# Patient Record
Sex: Male | Born: 1935 | Race: White | Hispanic: No | Marital: Married | Smoking: Former smoker
Health system: Southern US, Community
[De-identification: ages and names within clinical notes are randomized; demographics above are authoritative.]

## PROBLEM LIST (undated history)

## (undated) DIAGNOSIS — I10 Essential (primary) hypertension: Secondary | ICD-10-CM

## (undated) DIAGNOSIS — G4730 Sleep apnea, unspecified: Secondary | ICD-10-CM

## (undated) DIAGNOSIS — E785 Hyperlipidemia, unspecified: Secondary | ICD-10-CM

## (undated) DIAGNOSIS — G453 Amaurosis fugax: Secondary | ICD-10-CM

## (undated) DIAGNOSIS — I5189 Other ill-defined heart diseases: Secondary | ICD-10-CM

## (undated) HISTORY — DX: Sleep apnea, unspecified: G47.30

## (undated) HISTORY — PX: TONSILLECTOMY: SUR1361

## (undated) HISTORY — DX: Other ill-defined heart diseases: I51.89

## (undated) HISTORY — DX: Essential (primary) hypertension: I10

## (undated) HISTORY — DX: Amaurosis fugax: G45.3

## (undated) HISTORY — DX: Hyperlipidemia, unspecified: E78.5

## (undated) HISTORY — PX: CATARACT EXTRACTION: SUR2

## (undated) HISTORY — PX: CHOLECYSTECTOMY: SHX55

---

## 1995-08-15 HISTORY — PX: TRANSURETHRAL RESECTION OF PROSTATE: SHX73

## 1998-08-14 HISTORY — PX: SEPTOPLASTY: SUR1290

## 1999-01-20 ENCOUNTER — Other Ambulatory Visit: Admission: RE | Admit: 1999-01-20 | Discharge: 1999-01-20 | Payer: Self-pay | Admitting: *Deleted

## 2001-10-03 ENCOUNTER — Encounter: Payer: Self-pay | Admitting: Family Medicine

## 2001-10-03 ENCOUNTER — Encounter: Admission: RE | Admit: 2001-10-03 | Discharge: 2001-10-03 | Payer: Self-pay | Admitting: Family Medicine

## 2004-06-03 ENCOUNTER — Encounter: Admission: RE | Admit: 2004-06-03 | Discharge: 2004-06-03 | Payer: Self-pay | Admitting: Family Medicine

## 2011-12-20 ENCOUNTER — Encounter: Payer: Self-pay | Admitting: Cardiovascular Disease

## 2012-01-16 ENCOUNTER — Encounter: Payer: Self-pay | Admitting: Cardiovascular Disease

## 2012-12-03 ENCOUNTER — Other Ambulatory Visit (HOSPITAL_COMMUNITY): Payer: Self-pay | Admitting: Cardiovascular Disease

## 2012-12-03 DIAGNOSIS — I119 Hypertensive heart disease without heart failure: Secondary | ICD-10-CM

## 2013-01-10 ENCOUNTER — Other Ambulatory Visit: Payer: Self-pay | Admitting: *Deleted

## 2013-01-10 MED ORDER — LISINOPRIL 40 MG PO TABS: 40.0000 mg | ORAL_TABLET | Freq: Every day | ORAL | Status: DC

## 2013-01-16 ENCOUNTER — Other Ambulatory Visit: Payer: Self-pay | Admitting: *Deleted

## 2013-01-16 MED ORDER — LISINOPRIL 40 MG PO TABS: 40.0000 mg | ORAL_TABLET | Freq: Every day | ORAL | Status: DC

## 2013-01-20 ENCOUNTER — Telehealth: Payer: Self-pay | Admitting: Cardiovascular Disease

## 2013-01-20 NOTE — Telephone Encounter (Signed)
Was prescribe Ambien and wants to know is there anything else that he can prescribe for him similar because Ambien is expensive.  Thanks

## 2013-01-20 NOTE — Telephone Encounter (Signed)
Call patient to see what med is on formulary

## 2013-01-20 NOTE — Telephone Encounter (Signed)
Returned call.  Pt stated he was seen a few weeks ago and was rx'd [place].  Stated he got the [place] filled 20 pills for $120 and stated lunesta wasn't covered either.  Pt wants to know if there is something else that can be rx'd that may be covered.  Stated he was given information as to what was covered.  Pt informed Dr. Tresa Endo will be notified.  Scheduler not available and pt informed a message will be sent to call pt to reschedule.  Pt verbalized understanding and agreed w/ plan.  Message forwarded to Dr. Tresa Endo.  Paper chart#[id number] on Dr. Landry Dyke cart w/ this note.

## 2013-01-29 ENCOUNTER — Telehealth: Payer: Self-pay | Admitting: *Deleted

## 2013-01-29 MED ORDER — ZOLPIDEM TARTRATE 5 MG PO TABS: 5.0000 mg | ORAL_TABLET | Freq: Every evening | ORAL | Status: DC | PRN

## 2013-01-29 NOTE — Telephone Encounter (Signed)
Patient requested a different RX than the long acting zolpidem due to cost. Informed patient Dr. Tresa Endo recommended the regular zolpidem, however he was given the long acting for sleep maintenance, not to initiate sleep. The patient informs me that recently he has been using a chin strap along with the zolpidem ER and has been sleeping throughout the night. He will try the short-acting dosage once he completes the prescription he just filled today for the long-acting dosage.  I will mail a prescription to the patient. Chart along with a printed zolpidem RX given to Dr. Tresa Endo for signature.

## 2013-02-05 ENCOUNTER — Telehealth: Payer: Self-pay | Admitting: *Deleted

## 2013-02-05 NOTE — Telephone Encounter (Signed)
Mailed zolpidem prescription to patient.

## 2013-02-11 ENCOUNTER — Encounter: Payer: Self-pay | Admitting: Cardiovascular Disease

## 2013-02-12 ENCOUNTER — Telehealth: Payer: Self-pay | Admitting: Cardiovascular Disease

## 2013-02-12 NOTE — Telephone Encounter (Signed)
Went to his family doctor yesterday-he prescribed him to take  Ramelteon-In the mail yesterday he received a prescription for generic Ambien from Dr Tresa Endo! Which one should he take?

## 2013-02-12 NOTE — Telephone Encounter (Signed)
Message forwarded to W. Waddell, CMA.  Paper chart# [id number].

## 2013-04-11 ENCOUNTER — Other Ambulatory Visit: Payer: Self-pay | Admitting: *Deleted

## 2013-04-11 NOTE — Telephone Encounter (Signed)
Faxed back generic ambien refill back to pharmacy.

## 2013-05-20 ENCOUNTER — Ambulatory Visit (HOSPITAL_COMMUNITY)
Admission: RE | Admit: 2013-05-20 | Discharge: 2013-05-20 | Disposition: A | Discharge status: Home / Self Care | Payer: Medicare Other | Source: Ambulatory Visit | Attending: Cardiovascular Disease | Admitting: Cardiovascular Disease

## 2013-05-20 DIAGNOSIS — I119 Hypertensive heart disease without heart failure: Secondary | ICD-10-CM

## 2013-05-20 NOTE — Progress Notes (Signed)
2D Echo Performed 05/20/2013    Joni Colegrove, RCS  

## 2013-05-25 ENCOUNTER — Encounter: Payer: Self-pay | Admitting: *Deleted

## 2013-05-25 NOTE — Progress Notes (Signed)
Quick Note:  Note sent to patient ______ 

## 2013-05-26 ENCOUNTER — Encounter: Payer: Self-pay | Admitting: Cardiology

## 2013-05-27 ENCOUNTER — Encounter: Payer: Self-pay | Admitting: Cardiovascular Disease

## 2013-05-27 ENCOUNTER — Ambulatory Visit (INDEPENDENT_AMBULATORY_CARE_PROVIDER_SITE_OTHER): Payer: Medicare Other | Admitting: Cardiovascular Disease

## 2013-05-27 VITALS — BP 118/70 | HR 61 | Ht 72.0 in | Wt 206.1 lb

## 2013-05-27 DIAGNOSIS — G4733 Obstructive sleep apnea (adult) (pediatric): Secondary | ICD-10-CM | POA: Insufficient documentation

## 2013-05-27 DIAGNOSIS — Z9889 Other specified postprocedural states: Secondary | ICD-10-CM

## 2013-05-27 DIAGNOSIS — E785 Hyperlipidemia, unspecified: Secondary | ICD-10-CM

## 2013-05-27 DIAGNOSIS — Q619 Cystic kidney disease, unspecified: Secondary | ICD-10-CM

## 2013-05-27 DIAGNOSIS — N281 Cyst of kidney, acquired: Secondary | ICD-10-CM | POA: Insufficient documentation

## 2013-05-27 DIAGNOSIS — I10 Essential (primary) hypertension: Secondary | ICD-10-CM

## 2013-05-27 DIAGNOSIS — M109 Gout, unspecified: Secondary | ICD-10-CM

## 2013-05-27 DIAGNOSIS — Z9079 Acquired absence of other genital organ(s): Secondary | ICD-10-CM | POA: Insufficient documentation

## 2013-05-27 MED ORDER — ROSUVASTATIN CALCIUM 20 MG PO TABS: 20.0000 mg | ORAL_TABLET | Freq: Every day | ORAL | Status: DC

## 2013-05-27 NOTE — Progress Notes (Signed)
Patient ID: Kenneth [name], male   DOB: 06/09/1936, 77 y.o.   MRN: [id number]     HPI: Kenneth [name], is a 77 y.o. male who presents for six-month cardiology evaluation.  Kenneth [name] has a history of hypertension, hyperlipidemia, gout, and is status post TURP surgery [name]. He also has documented bilateral renal cysts. He has a history of documented moderate obstructive sleep apnea with an HIV 0.2 0.7 and an RDI of 33 and has been on BiPAP therapy with a patent minimum of 10, IPAP maximum potential up to 25 with a delta of for which he started in July 2012. With CPAP therapy, he did develop central events.  Kenneth [name] recently underwent a three-year followup echo Doppler study. This revealed normal left ventricle wall thickness with normal systolic and diastolic function. He did have mild aortic regurgitation due to poor central leaflet coaptation. It was borderline prolapse of his anterior mitral valve leaflet with trivial mitral regurgitation. He had normal pulmonary pressures assessment artery systolic pressure 19 mm.  He tells me his primary physician recently increased torsemide 20 mg daily due to leg swelling., He has noticed some very mild episodes of dizziness. He denies recent dizziness presently.  He states he's sleeping approximately 6 hours per night. He has taken Ambien at times to help him fall asleep. He denies residual daytime sleepiness. He denies frequent awakenings. His sleep is restorative but at times is not adequate duration.  Past Medical History  Diagnosis Date  . HTN (hypertension)   . Dyslipidemia   . Sleep apnea     on BiPap  . Diastolic dysfunction     grade 1 by echo 4/11    Past Surgical History  Procedure Laterality Date  . Transurethral resection of prostate  1997  . Septoplasty  2000    No Known Allergies  Current Outpatient Prescriptions  Medication Sig Dispense Refill  . allopurinol (ZYLOPRIM) 100 MG tablet Take 100 mg by mouth daily.      Marland Kitchen  amLODipine (NORVASC) 5 MG tablet Take 5 mg by mouth daily.      Marland Kitchen aspirin 81 MG tablet Take 81 mg by mouth daily.      . ferrous sulfate 325 (65 FE) MG EC tablet Take 325 mg by mouth daily.      . fish oil-omega-3 fatty acids 1000 MG capsule Take 2 g by mouth daily.      Marland Kitchen HYDROcodone-acetaminophen (NORCO/VICODIN) 5-325 MG per tablet Take 1 tablet by mouth as needed.      Marland Kitchen lisinopril (PRINIVIL,ZESTRIL) 40 MG tablet Take 1 tablet (40 mg total) by mouth daily.  30 tablet  6  . loratadine (CLARITIN) 10 MG tablet Take 10 mg by mouth daily.      . Multiple Vitamin (MULTIVITAMIN WITH MINERALS) TABS Take 1 tablet by mouth daily.      . Nebivolol HCl (BYSTOLIC) 20 MG TABS Take 1 tablet by mouth daily.      . rosuvastatin (CRESTOR) 20 MG tablet Take 20 mg by mouth daily.      . sildenafil (VIAGRA) 50 MG tablet Take 50 mg by mouth as needed for erectile dysfunction.      . tamsulosin (FLOMAX) 0.4 MG CAPS Take 0.4 mg by mouth daily after supper.      . torsemide (DEMADEX) 20 MG tablet Take 20 mg by mouth daily.      Marland Kitchen zolpidem (AMBIEN) 5 MG tablet Take 1 tablet (5 mg total) by mouth at bedtime as  needed for sleep.  30 tablet  0   No current facility-administered medications for this visit.    History   Social History  . Marital Status: Single    Spouse Name: N/A    Number of Children: N/A  . Years of Education: N/A   Occupational History  . Not on file.   Social History Main Topics  . Smoking status: Never Smoker   . Smokeless tobacco: Not on file  . Alcohol Use: Not on file  . Drug Use: Not on file  . Sexual Activity: Not on file   Other Topics Concern  . Not on file   Social History Narrative  . No narrative on file    Family History  Problem Relation Age of Onset  . Alzheimer's disease Mother 46  . Emphysema Father    Socially he is married and has 3 children, 14 grandchildren and 3 great-grandchildren. There is no tobacco or alcohol use.   ROS is negative for fevers,  chills or night sweats. He denies recent dizzy spells. He denies syncope. He is unaware of palpitations. He denies PND or orthopnea. There is no wheezing. There is no chest pressure. He denies any blood in his stool or urine. He denies abdominal complaints. There is no claudication. He denies restless legs. He denies hallucinations. He is unaware of any breakthrough snoring. He no longer notes leg swelling   Other comprehensive 12 point system review is negative.  PE BP 118/70  Pulse 61  Ht 6' (1.829 m)  Wt 206 lb 1.6 oz (93.486 kg)  BMI 27.95 kg/m2  Repeat blood pressure by me was 108/70 supine was 102/68 standing General: Alert, oriented, no distress.  Skin: normal turgor, no rashes HEENT: Normocephalic, atraumatic. Pupils round and reactive; sclera anicteric;no lid lag.  Nose without nasal septal hypertrophy Mouth/Parynx benign; Mallinpatti scale 3 Neck: No JVD, no carotid briuts Lungs: clear to ausculatation and percussion; no wheezing or rales Heart: RRR, s1 s2 normal 1/6 systolic murmur and a whiff of an aortic insufficiency murmur Abdomen: soft, nontender; no hepatosplenomehaly, BS+; abdominal aorta nontender and not dilated by palpation. Pulses 2+ Extremities: no clubbing cyanosis or edema, Homan's sign negative  Neurologic: grossly nonfocal Psychologic: Normal affect and mood  ECG: Sinus rhythm at 61 beats per minute. Borderline first degree AV block with PR interval 200 ms. QT interval normal 388 ms.  LABS:  BMET No results found for this basename: na, k, cl, co2, glucose, bun, creatinine, calcium, gfrnonaa, gfraa     Hepatic Function Panel  No results found for this basename: prot, albumin, ast, alt, alkphos, bilitot, bilidir, ibili     CBC No results found for this basename: wbc, rbc, hgb, hct, plt, mcv, mch, mchc, rdw, neutrabs, lymphsabs, monoabs, eosabs, basosabs     BNP No results found for this basename: probnp    Lipid Panel  No results found for this  basename: chol, trig, hdl, cholhdl, vldl, ldlcalc     RADIOLOGY: No results found.    ASSESSMENT AND PLAN: Kenneth [name] is a 77 year old gentleman who is a history of hypertension, hyperlipidemia, intermittent peripheral edema, as well as remote history of gout. He has documented bilateral renal cysts and is status post TURP. His blood pressure today is somewhat low on his fairly aggressive regimen. His most recent echo Doppler study actually is improved and now shows normal diastolic function. At present, there are no signs of leg swelling. I recommended he reduce his torsemide to 10  mg and then he can take an extra dose if necessary depending upon leg edema. His ECG remained stable. I reviewed his echo Doppler study in detail with him. Laboratory in April 2014 showed a normal hemoglobin and hematocrit. He tells me his primary physician had recently started some iron after subsequent blood work was somewhat lower but apparently the patient gave blood several weeks ago and his blood count was again elevated. He has tolerated the increase fish oil preparation to 2 capsules daily which was adjusted in April after his triglyceride level was 164. We did try to provide him with samples of Crestor today. As long as he remains stable I will see him in 6 months for cardiology reevaluation.     Lennette Bihari, MD, [facility]  05/27/2013 10:04 AM

## 2013-05-27 NOTE — Patient Instructions (Addendum)
Your physician recommends that you schedule a follow-up appointment in: 6 MONTHS.  Your physician has recommended you make the following change in your medication: decrease the fluid pill to 10mg  daily (Torsemide) you can take xtra as needed for swelling.  No other changes were made in your treatment today.

## 2013-05-29 ENCOUNTER — Encounter: Payer: Self-pay | Admitting: Cardiovascular Disease

## 2013-08-02 ENCOUNTER — Encounter: Payer: Self-pay | Admitting: Emergency Medicine

## 2013-08-02 ENCOUNTER — Emergency Department (INDEPENDENT_AMBULATORY_CARE_PROVIDER_SITE_OTHER)
Admission: EM | Admit: 2013-08-02 | Discharge: 2013-08-02 | Disposition: A | Discharge status: Home / Self Care | Payer: Medicare Other | Source: Home / Self Care | Attending: Family Medicine | Admitting: Family Medicine

## 2013-08-02 DIAGNOSIS — N410 Acute prostatitis: Secondary | ICD-10-CM

## 2013-08-02 LAB — POCT URINALYSIS DIP (MANUAL ENTRY)
Blood, UA: NEGATIVE
Ketones, POC UA: NEGATIVE
Protein Ur, POC: 30
Spec Grav, UA: 1.01 (ref 1.005–1.03)
Urobilinogen, UA: 0.2 (ref 0–1)
pH, UA: 5.5 (ref 5–8)

## 2013-08-02 MED ORDER — SULFAMETHOXAZOLE-TMP DS 800-160 MG PO TABS: 1.0000 | ORAL_TABLET | Freq: Two times a day (BID) | ORAL | Status: DC

## 2013-08-02 NOTE — ED Provider Notes (Signed)
CSN: [id number]     Arrival date & time 08/02/13  1406 History   First MD Initiated Contact with Patient 08/02/13 1534     Chief Complaint  Patient presents with  . Fever  . Dysuria  . Urinary Frequency      HPI Comments: Patient has a long history of BPH and recurring prostatitis.  Recently he has had increased difficulty urinating each morning.  He awoke this morning with dusuria, chills and fever to 101.3, increased thirst, and lower back ache.  His urologist has instructed him in a daily cath each morning.  He is scheduled for urodynamic studies and cystoscope early next year.  He has had several TURP's in the past.  Patient is a 77 y.o. male presenting with dysuria. The history is provided by the patient.  Dysuria This is a recurrent problem. The current episode started 6 to 12 hours ago. The problem occurs constantly. The problem has not changed since onset.Pertinent negatives include no abdominal pain. Nothing aggravates the symptoms. Nothing relieves the symptoms. He has tried nothing for the symptoms.    Past Medical History  Diagnosis Date  . HTN (hypertension)   . Dyslipidemia   . Sleep apnea     on BiPap  . Diastolic dysfunction     grade 1 by echo 4/11   Past Surgical History  Procedure Laterality Date  . Transurethral resection of prostate  1997  . Septoplasty  2000   Family History  Problem Relation Age of Onset  . Alzheimer's disease Mother 13  . Emphysema Father    History  Substance Use Topics  . Smoking status: Never Smoker   . Smokeless tobacco: Not on file  . Alcohol Use: Not on file    Review of Systems  Constitutional: Positive for fever, chills and fatigue.  HENT: Negative.   Eyes: Negative.   Respiratory: Negative.   Cardiovascular: Negative.   Gastrointestinal: Negative.  Negative for abdominal pain.  Genitourinary: Positive for dysuria, urgency, frequency and decreased urine volume. Negative for hematuria and flank pain.  Musculoskeletal:  Negative.   Skin: Negative.     Allergies  Review of patient's allergies indicates no known allergies.  Home Medications   Current Outpatient Rx  Name  Route  Sig  Dispense  Refill  . allopurinol (ZYLOPRIM) 100 MG tablet   Oral   Take 100 mg by mouth daily.         Marland Kitchen amLODipine (NORVASC) 5 MG tablet   Oral   Take 5 mg by mouth daily.         Marland Kitchen aspirin 81 MG tablet   Oral   Take 81 mg by mouth daily.         . ferrous sulfate 325 (65 FE) MG EC tablet   Oral   Take 325 mg by mouth daily.         . fish oil-omega-3 fatty acids 1000 MG capsule   Oral   Take 2 g by mouth daily.         Marland Kitchen HYDROcodone-acetaminophen (NORCO/VICODIN) 5-325 MG per tablet   Oral   Take 1 tablet by mouth as needed.         Marland Kitchen lisinopril (PRINIVIL,ZESTRIL) 40 MG tablet   Oral   Take 1 tablet (40 mg total) by mouth daily.   30 tablet   6   . loratadine (CLARITIN) 10 MG tablet   Oral   Take 10 mg by mouth daily.         Marland Kitchen  Multiple Vitamin (MULTIVITAMIN WITH MINERALS) TABS   Oral   Take 1 tablet by mouth daily.         . Nebivolol HCl (BYSTOLIC) 20 MG TABS   Oral   Take 1 tablet by mouth daily.         . rosuvastatin (CRESTOR) 20 MG tablet   Oral   Take 20 mg by mouth daily.         . rosuvastatin (CRESTOR) 20 MG tablet   Oral   Take 1 tablet (20 mg total) by mouth daily.   28 tablet   0   . sildenafil (VIAGRA) 50 MG tablet   Oral   Take 50 mg by mouth as needed for erectile dysfunction.         . sulfamethoxazole-trimethoprim (BACTRIM DS) 800-160 MG per tablet   Oral   Take 1 tablet by mouth 2 (two) times daily.   20 tablet   0   . tamsulosin (FLOMAX) 0.4 MG CAPS   Oral   Take 0.4 mg by mouth daily after supper.         . torsemide (DEMADEX) 20 MG tablet   Oral   Take 20 mg by mouth daily.         Marland Kitchen zolpidem (AMBIEN) 5 MG tablet   Oral   Take 1 tablet (5 mg total) by mouth at bedtime as needed for sleep.   30 tablet   0    BP 116/67   Pulse 78  Temp(Src) 100 F (37.8 C) (Oral)  Resp 16  Ht 6' (1.829 m)  Wt 205 lb 12 oz (93.328 kg)  BMI 27.90 kg/m2  SpO2 95% Physical Exam Nursing notes and Vital Signs reviewed. Appearance:  Patient appears healthy, stated age, and in no acute distress Eyes:  Pupils are equal, round, and reactive to light and accomodation.  Extraocular movement is intact.  Conjunctivae are not inflamed  Pharynx:  Normal Neck:  Supple.  No adenopathy Lungs:  Clear to auscultation.  Breath sounds are equal.  Heart:  Regular rate and rhythm without murmurs, rubs, or gallops.  Abdomen:  Nontender without masses or hepatosplenomegaly.  Bowel sounds are present.  No CVA or flank tenderness.  Extremities:  No edema.  No calf tenderness Skin:  No rash present.   ED Course  Procedures  None    Labs Reviewed  POCT URINALYSIS DIP (MANUAL ENTRY):  PRO 30mg /dL; LEU Small         MDM   1. Prostatitis, acute    Urine culture pending.  Begin Septra DS Followup with Family Doctor if not improved in one week.  If symptoms become significantly worse during the night or over the weekend, proceed to the local emergency room.      Lattie Haw, MD 08/04/13 1630

## 2013-08-02 NOTE — ED Notes (Signed)
Patient c/o fever and body aches since this morning. Patient also c/o dysuria and urinary frequency . Patient states he goes to a urologist and saw him Tuesday which put him on daily cath.

## 2013-08-05 LAB — URINE CULTURE

## 2013-08-08 ENCOUNTER — Telehealth: Payer: Self-pay

## 2013-08-08 NOTE — ED Notes (Signed)
I called and spoke with patient and he is doing better. I advised to call back if anything changes or if he has questions or concerns. Urine culture came was positive and he is on the right medication.

## 2013-09-13 ENCOUNTER — Other Ambulatory Visit: Payer: Self-pay | Admitting: Cardiovascular Disease

## 2013-09-15 NOTE — Telephone Encounter (Signed)
Rx was sent to pharmacy electronically. 

## 2013-10-10 ENCOUNTER — Other Ambulatory Visit: Payer: Self-pay | Admitting: Cardiovascular Disease

## 2013-10-10 NOTE — Telephone Encounter (Signed)
Rx was sent to pharmacy electronically. 

## 2013-11-04 ENCOUNTER — Other Ambulatory Visit: Payer: Self-pay | Admitting: Cardiovascular Disease

## 2013-11-05 NOTE — Telephone Encounter (Signed)
Called refill for zolpidem to pharmacy X 2.

## 2013-11-24 ENCOUNTER — Telehealth: Payer: Self-pay | Admitting: Cardiovascular Disease

## 2013-11-24 MED ORDER — NEBIVOLOL HCL 20 MG PO TABS: 1.0000 | ORAL_TABLET | Freq: Every day | ORAL | Status: DC

## 2013-11-24 NOTE — Telephone Encounter (Signed)
Refill(s) sent to pharmacy: 30 day supply w/ one refill.

## 2013-11-24 NOTE — Telephone Encounter (Signed)
Needs to have his Bystolic 10 mg refilled . His appt had to be changed and cannot see Dr.Kelly until 01/23/14.Kenneth [name].He does not have any left after today .Kenneth [name].  Thanks

## 2013-11-24 NOTE — Telephone Encounter (Signed)
Pt.notified

## 2013-11-25 ENCOUNTER — Ambulatory Visit: Payer: Medicare Other | Admitting: Cardiovascular Disease

## 2014-01-07 ENCOUNTER — Other Ambulatory Visit: Payer: Self-pay | Admitting: Cardiovascular Disease

## 2014-01-07 NOTE — Telephone Encounter (Signed)
Rx was sent to pharmacy electronically. 

## 2014-01-09 ENCOUNTER — Other Ambulatory Visit: Payer: Self-pay | Admitting: *Deleted

## 2014-01-09 MED ORDER — LISINOPRIL 40 MG PO TABS: ORAL_TABLET | ORAL | Status: DC

## 2014-01-09 MED ORDER — NEBIVOLOL HCL 20 MG PO TABS: 1.0000 | ORAL_TABLET | Freq: Every day | ORAL | Status: DC

## 2014-01-09 MED ORDER — AMLODIPINE BESYLATE 5 MG PO TABS: ORAL_TABLET | ORAL | Status: DC

## 2014-01-09 MED ORDER — TORSEMIDE 20 MG PO TABS: 20.0000 mg | ORAL_TABLET | Freq: Every day | ORAL | Status: DC

## 2014-01-09 NOTE — Telephone Encounter (Signed)
Rx was sent to pharmacy electronically. 

## 2014-01-23 ENCOUNTER — Ambulatory Visit (INDEPENDENT_AMBULATORY_CARE_PROVIDER_SITE_OTHER): Payer: Medicare Other | Admitting: Cardiovascular Disease

## 2014-01-23 ENCOUNTER — Encounter: Payer: Self-pay | Admitting: Cardiovascular Disease

## 2014-01-23 VITALS — BP 124/68 | HR 59 | Ht 72.0 in | Wt 200.0 lb

## 2014-01-23 DIAGNOSIS — N281 Cyst of kidney, acquired: Secondary | ICD-10-CM

## 2014-01-23 DIAGNOSIS — I10 Essential (primary) hypertension: Secondary | ICD-10-CM

## 2014-01-23 DIAGNOSIS — Q619 Cystic kidney disease, unspecified: Secondary | ICD-10-CM

## 2014-01-23 DIAGNOSIS — M109 Gout, unspecified: Secondary | ICD-10-CM

## 2014-01-23 DIAGNOSIS — E785 Hyperlipidemia, unspecified: Secondary | ICD-10-CM

## 2014-01-23 DIAGNOSIS — G4733 Obstructive sleep apnea (adult) (pediatric): Secondary | ICD-10-CM

## 2014-01-23 MED ORDER — NEBIVOLOL HCL 10 MG PO TABS: ORAL_TABLET | ORAL | Status: DC

## 2014-01-23 NOTE — Patient Instructions (Signed)
Your physician wants you to follow-up in 6 month Dr Tresa EndoKelly. You will receive a reminder letter in the mail two months in advance. If you don't receive a letter, please call our office to schedule the follow-up appointment.

## 2014-01-25 ENCOUNTER — Encounter: Payer: Self-pay | Admitting: Cardiovascular Disease

## 2014-01-25 MED ORDER — NEBIVOLOL HCL 10 MG PO TABS: 10.0000 mg | ORAL_TABLET | Freq: Every day | ORAL | Status: DC

## 2014-01-25 NOTE — Progress Notes (Signed)
Patient ID: Albaro Deviney, male   DOB: October 02, 1935, 78 y.o.   MRN: [id number]     HPI: Libero Puthoff is a 78 y.o. male who presents for a 33-month cardiology evaluation.  Mr. Seeling has a history of hypertension, hyperlipidemia, gout, and is status post TURP surgery [name]. He also has documented bilateral renal cysts. He has a history of documented moderate obstructive sleep apnea with an AHIU of 22.7 and an RDI of 33 and has been on BiPAP Auto therapy with an EPAP minimum of 10, IPAP maximum potential up to 25 with a delta of for which he started in July 2012. 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  An echo Doppler study in 05/2013 revealed normal left ventricle wall thickness with normal systolic and diastolic function. He did have mild aortic regurgitation due to poor central leaflet coaptation. There was borderline prolapse of his anterior mitral valve leaflet with trivial mitral regurgitation. He had normal pulmonary pressures assessment artery systolic pressure 19 mm.  He has had issues in the past with peripheral edema, which had led to an increased dose of his torsemide last year.  He denies exertionally precipitated chest pain.  He did experience one episode of some chest fullness or night when he was sitting on his bed, but this ultimately resolved, and he has not had any further sensation He is unaware of palpitations.  He denies presyncope or syncope.  He states he's sleeping approximately 6 hours per night. He has taken Ambien at times to help him fall asleep. He denies residual daytime sleepiness. He denies frequent awakenings. His sleep is restorative but at times is not adequate duration.  Past Medical History  Diagnosis Date  . HTN (hypertension)   . Dyslipidemia   . Sleep apnea     on BiPap  . Diastolic dysfunction     grade 1 by echo 4/11    Past Surgical History  Procedure Laterality Date  . Transurethral resection of prostate   1997  . Septoplasty  2000    No Known Allergies  Current Outpatient Prescriptions  Medication Sig Dispense Refill  . allopurinol (ZYLOPRIM) 100 MG tablet Take 100 mg by mouth daily.      Marland Kitchen amLODipine (NORVASC) 5 MG tablet TAKE 1 TABLET BY MOUTH DAILY  90 tablet  1  . aspirin 81 MG tablet Take 81 mg by mouth daily.      . ferrous sulfate 325 (65 FE) MG EC tablet Take 325 mg by mouth daily.      . fish oil-omega-3 fatty acids 1000 MG capsule Take 2 g by mouth daily.      Marland Kitchen lisinopril (PRINIVIL,ZESTRIL) 40 MG tablet TAKE 1 TABLET BY MOUTH DAILY  90 tablet  1  . loratadine (CLARITIN) 10 MG tablet Take 10 mg by mouth daily.      . Multiple Vitamin (MULTIVITAMIN WITH MINERALS) TABS Take 1 tablet by mouth daily.      . Nebivolol HCl (BYSTOLIC) 20 MG TABS Take 1 tablet (20 mg total) by mouth daily.  90 tablet  1  . rosuvastatin (CRESTOR) 20 MG tablet Take 1 tablet (20 mg total) by mouth daily.  28 tablet  0  . sildenafil (VIAGRA) 50 MG tablet Take 50 mg by mouth as needed for erectile dysfunction.      . tamsulosin (FLOMAX) 0.4 MG CAPS Take 0.4 mg by mouth daily after supper.      . torsemide (DEMADEX)  20 MG tablet Take 1 tablet (20 mg total) by mouth daily.  90 tablet  1  . zolpidem (AMBIEN) 5 MG tablet TAKE 1 TABLET BY MOUTH EVERY NIGHT AT BEDTIME AS NEEDED FOR SLEEP AS DIRECTED  30 tablet  0  . nebivolol (BYSTOLIC) 10 MG tablet Take 2 tablet daliy  60 tablet  6   No current facility-administered medications for this visit.    History   Social History  . Marital Status: Single    Spouse Name: N/A    Number of Children: N/A  . Years of Education: N/A   Occupational History  . Not on file.   Social History Main Topics  . Smoking status: Never Smoker   . Smokeless tobacco: Not on file  . Alcohol Use: Not on file  . Drug Use: Not on file  . Sexual Activity: Not on file   Other Topics Concern  . Not on file   Social History Narrative  . No narrative on file    Family History    Problem Relation Age of Onset  . Alzheimer's disease Mother [id number]  . Emphysema Father    Socially he is married and has 3 children, 14 grandchildren and 3 great-grandchildren. There is no tobacco or alcohol use.  ROS General: Negative; No fevers, chills, or night sweats;  HEENT: Negative; No changes in vision or hearing, sinus congestion, difficulty swallowing Pulmonary: Negative; No cough, wheezing, shortness of breath, hemoptysis Cardiovascular: Negative; No chest pain, presyncope, syncope, palpatations GI: Negative; No nausea, vomiting, diarrhea, or abdominal pain GU: Negative; No dysuria, hematuria, or difficulty voiding Musculoskeletal: Negative; no myalgias, joint pain, or weakness Hematologic/Oncology: Negative; no easy bruising, bleeding Endocrine: Negative; no heat/cold intolerance; no diabetes Neuro: Negative; no changes in balance, headaches Skin: Negative; No rashes or skin lesions Psychiatric: Negative; No behavioral problems, depression Sleep: Positive for sleep apnea.  With BiPAP therapy he is unaware of any breakthrough snoring, daytime sleepiness, hypersomnolence, bruxism, restless legs, hypnogognic hallucinations, no cataplexy Other comprehensive 14 point system review is negative.   PE BP 124/68  Pulse 59  Ht 6' (1.829 m)  Wt 200 lb (90.719 kg)  BMI 27.12 kg/m2  Repeat blood pressure by me was 108/70 supine was 102/68 standing General: Alert, oriented, no distress.  Skin: normal turgor, no rashes HEENT: Normocephalic, atraumatic. Pupils round and reactive; sclera anicteric;no lid lag.  Nose without nasal septal hypertrophy Mouth/Parynx benign; Mallinpatti scale 3 Neck: No JVD, no carotid bruits with normal carotid upstroke Lungs: clear to ausculatation and percussion; no wheezing or rales Heart: RRR, s1 s2 normal 1/6 systolic murmur and a whiff of an aortic insufficiency murmur Abdomen: soft, nontender; no hepatosplenomehaly, BS+; abdominal aorta nontender and  not dilated by palpation. Pulses 2+ Extremities: no clubbing cyanosis or edema, Homan's sign negative  Neurologic: grossly nonfocal Psychologic: Normal affect and mood  ECG (independently read by me): sinus rhythm at 59 beats per minute.  Borderline first degree AV block with a PR interval of 204 ms.  ECG: Sinus rhythm at 61 beats per minute. Borderline first degree AV block with PR interval 200 ms. QT interval normal 388 ms.  LABS:  BMET No results found for this basename: na,  k,  cl,  co2,  glucose,  bun,  creatinine,  calcium,  gfrnonaa,  gfraa     Hepatic Function Panel  No results found for this basename: prot,  albumin,  ast,  alt,  alkphos,  bilitot,  bilidir,  ibili  CBC No results found for this basename: wbc,  rbc,  hgb,  hct,  plt,  mcv,  mch,  mchc,  rdw,  neutrabs,  lymphsabs,  monoabs,  eosabs,  basosabs     BNP No results found for this basename: probnp    Lipid Panel  No results found for this basename: chol,  trig,  hdl,  cholhdl,  vldl,  ldlcalc     RADIOLOGY: No results found.    ASSESSMENT AND PLAN: Mr. Michel BickersJames Fountaine is a 78 year old gentleman who is a history of hypertension, hyperlipidemia, intermittent peripheral edema, as well as remote history of gout. He has documented bilateral renal cysts and is status post TURP. His blood pressure today is well controlled on his current therapy without evidence for orthostasis with blood pressure 130/78 supine and 130/70 standing.  When taken by me.  He is on  Demadex 20 mg Bystolic 20 mg, lisinopril 40 mg in addition to amlodipine 5 mg.  He takes allopurinol 100 mg for gout, but denies any recent episodes.  His most recent echo Doppler study is improved from previously and  shows normal diastolic function.  He recently had a complete set of blood work by his primary physician.  I reviewed his CBC, chemistry profile, and lipids, which are normal.  Cholesterol was excellent at 170 triglycerides 110, LDL 49 and  VLDL 22.  HDL was still slightly low at 36.  He continues to use his BiPAP therapy with 100% compliance.  He tells me he now was recently started on a full face mask to do to oral venting with his nasal mask and chin strap.   Lennette Biharihomas A. Red Mandt, MD, [facility]  01/25/2014 5:47 PM

## 2014-04-22 ENCOUNTER — Telehealth: Payer: Self-pay | Admitting: Cardiovascular Disease

## 2014-04-27 NOTE — Telephone Encounter (Signed)
Closed encounter °

## 2014-07-23 ENCOUNTER — Ambulatory Visit (INDEPENDENT_AMBULATORY_CARE_PROVIDER_SITE_OTHER): Payer: Medicare Other | Admitting: Cardiovascular Disease

## 2014-07-23 VITALS — BP 130/62 | HR 59 | Ht 72.0 in | Wt 210.8 lb

## 2014-07-23 DIAGNOSIS — Q6102 Congenital multiple renal cysts: Secondary | ICD-10-CM

## 2014-07-23 DIAGNOSIS — H53122 Transient visual loss, left eye: Secondary | ICD-10-CM

## 2014-07-23 DIAGNOSIS — Z9889 Other specified postprocedural states: Secondary | ICD-10-CM

## 2014-07-23 DIAGNOSIS — E785 Hyperlipidemia, unspecified: Secondary | ICD-10-CM

## 2014-07-23 DIAGNOSIS — N281 Cyst of kidney, acquired: Secondary | ICD-10-CM

## 2014-07-23 DIAGNOSIS — H5340 Unspecified visual field defects: Secondary | ICD-10-CM

## 2014-07-23 DIAGNOSIS — G4733 Obstructive sleep apnea (adult) (pediatric): Secondary | ICD-10-CM

## 2014-07-23 DIAGNOSIS — Z9079 Acquired absence of other genital organ(s): Secondary | ICD-10-CM

## 2014-07-23 DIAGNOSIS — Z79899 Other long term (current) drug therapy: Secondary | ICD-10-CM

## 2014-07-23 DIAGNOSIS — I10 Essential (primary) hypertension: Secondary | ICD-10-CM

## 2014-07-23 LAB — CBC
HEMATOCRIT: 41.2 % (ref 39.0–52.0)
Hemoglobin: 14.6 g/dL (ref 13.0–17.0)
MCH: 32.6 pg (ref 26.0–34.0)
MCHC: 35.4 g/dL (ref 30.0–36.0)
MCV: 92 fL (ref 78.0–100.0)
MPV: 10.9 fL (ref 9.4–12.4)
Platelets: 170 10*3/uL (ref 150–400)
RBC: 4.48 MIL/uL (ref 4.22–5.81)
RDW: 14 % (ref 11.5–15.5)
WBC: 6.8 10*3/uL (ref 4.0–10.5)

## 2014-07-23 LAB — COMPREHENSIVE METABOLIC PANEL
ALK PHOS: 56 U/L (ref 39–117)
ALT: 15 U/L (ref 0–53)
AST: 18 U/L (ref 0–37)
Albumin: 4.1 g/dL (ref 3.5–5.2)
BILIRUBIN TOTAL: 0.7 mg/dL (ref 0.2–1.2)
BUN: 22 mg/dL (ref 6–23)
CO2: 29 mEq/L (ref 19–32)
CREATININE: 1.38 mg/dL — AB (ref 0.50–1.35)
Calcium: 9.3 mg/dL (ref 8.4–10.5)
Chloride: 104 mEq/L (ref 96–112)
GLUCOSE: 87 mg/dL (ref 70–99)
Potassium: 4.6 mEq/L (ref 3.5–5.3)
Sodium: 140 mEq/L (ref 135–145)
Total Protein: 7.1 g/dL (ref 6.0–8.3)

## 2014-07-23 LAB — TSH: TSH: 1.683 u[IU]/mL (ref 0.350–4.500)

## 2014-07-23 NOTE — Patient Instructions (Addendum)
Your physician recommends that you return for lab work.  Your physician has requested that you have a carotid duplex. This test is an ultrasound of the carotid arteries in your neck. It looks at blood flow through these arteries that supply the brain with blood. Allow one hour for this exam. There are no restrictions or special instructions.  [facility]  Your physician wants you to follow-up in: 6 months or sooner if needed. You will receive a reminder letter in the mail two months in advance. If you don't receive a letter, please call our office to schedule the follow-up appointment.

## 2014-07-24 ENCOUNTER — Encounter: Payer: Self-pay | Admitting: Cardiovascular Disease

## 2014-07-24 DIAGNOSIS — H5340 Unspecified visual field defects: Secondary | ICD-10-CM | POA: Insufficient documentation

## 2014-07-24 NOTE — Progress Notes (Signed)
Patient ID: Kenneth BickersJames [name], male   DOB: November 22, 1935, 78 y.o.   MRN: [id number]     HPI: Kenneth [name] is a 78 y.o. male who presents for a 6 month cardiology evaluation.  Kenneth [name] has a history of hypertension, hyperlipidemia, gout, and is status post TURP surgery by Dr. Logan [name]. He also has  bilateral renal cysts. He has a history of documented moderate obstructive sleep apnea with an AHI of 22.7 and an RDI of 33 and has been on BiPAP Auto therapy with an EPAP minimum of 10, IPAP maximum potential up to 25 with a delta of for which he started in July 2012. 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  An echo Doppler study in 05/2013 revealed normal left ventricle wall thickness with normal systolic and diastolic function. He did have mild aortic regurgitation due to poor central leaflet coaptation. There was borderline prolapse of his anterior mitral valve leaflet with trivial mitral regurgitation. He had normal pulmonary pressures assessment artery systolic pressure 19 mm.  He has had issues in the past with peripheral edema, which had led to an increased dose of his torsemide last year.  He denies exertionally precipitated chest pain.  He did experience one episode of some chest fullness or night when he was sitting on his bed, but this ultimately resolved, and he has not had any further sensation He is unaware of palpitations.  He denies presyncope or syncope.  He states he's sleeping approximately 6 hours per night. He has taken Ambien at times to help him fall asleep. He denies residual daytime sleepiness. He denies frequent awakenings. His sleep is restorative but at times is not adequate duration.  Since I last saw him 6 months ago, he states that he has had 2 episodes where he developed transient field defect in the lateral left eye.  These were both self-limiting.  He recently went to see his ophthalmologist who recommended that he notify me concerning days  abnormalities.  His history is notable in that he did sustain to prior head injuries many years ago.  Once when he was in high school and had a severe concussion.  Another time occurred when he fell out of a tree stand when he was hunting.  He denies chest pain.  He denies shortness of breath.  He denies paresthesias.  He had laboratory done by his primary physician on 05/06/2014.  I reviewed these in detail.  He had normal iron studies, B12 and folate levels.  Hemoglobin was 12.7, hematocrit 36.9.  Platelet count was 1 45,000.  Fasting blood sugar was 97.  Renal function was normal with a creatinine of 1.1.  Lipid studies and thyroid studies were not performed.  Past Medical History  Diagnosis Date  . HTN (hypertension)   . Dyslipidemia   . Sleep apnea     on BiPap  . Diastolic dysfunction     grade 1 by echo 4/11    Past Surgical History  Procedure Laterality Date  . Transurethral resection of prostate  1997  . Septoplasty  2000    No Known Allergies  Current Outpatient Prescriptions  Medication Sig Dispense Refill  . diclofenac sodium (VOLTAREN) 1 % GEL Apply 1 application topically as needed.    . ferrous sulfate 325 (65 FE) MG EC tablet Take 325 mg by mouth daily.    . fish oil-omega-3 fatty acids 1000 MG capsule Take 2 g by mouth daily.    .Marland Kitchen  lisinopril (PRINIVIL,ZESTRIL) 40 MG tablet TAKE 1 TABLET BY MOUTH DAILY 90 tablet 1  . loratadine (CLARITIN) 10 MG tablet Take 10 mg by mouth daily.    . Multiple Vitamin (MULTIVITAMIN WITH MINERALS) TABS Take 1 tablet by mouth daily.    . nebivolol (BYSTOLIC) 10 MG tablet Take 1 tablet (10 mg total) by mouth daily. 28 tablet 0  . ramelteon (ROZEREM) 8 MG tablet Take 1 tablet by mouth at bedtime.    . rosuvastatin (CRESTOR) 20 MG tablet Take 1 tablet (20 mg total) by mouth daily. 28 tablet 0  . sildenafil (VIAGRA) 50 MG tablet Take 50 mg by mouth as needed for erectile dysfunction.    . tamsulosin (FLOMAX) 0.4 MG CAPS Take 0.4 mg by  mouth daily after supper.    . testosterone cypionate (DEPOTESTOTERONE CYPIONATE) 200 MG/ML injection Inject 400 mg into the muscle every 30 (thirty) days.    Marland Kitchen. torsemide (DEMADEX) 20 MG tablet Take 1 tablet (20 mg total) by mouth daily. 90 tablet 1  . allopurinol (ZYLOPRIM) 100 MG tablet Take 100 mg by mouth daily.    Marland Kitchen. amLODipine (NORVASC) 5 MG tablet TAKE 1 TABLET BY MOUTH DAILY 90 tablet 1  . aspirin 81 MG tablet Take 81 mg by mouth daily.    . Cholecalciferol (VITAMIN D-1000 MAX ST) 1000 UNITS tablet Take 1,000 Units by mouth daily.    . CRESTOR 10 MG tablet Take 1 tablet by mouth daily.     No current facility-administered medications for this visit.    History   Social History  . Marital Status: Single    Spouse Name: N/A    Number of Children: N/A  . Years of Education: N/A   Occupational History  . Not on file.   Social History Main Topics  . Smoking status: Never Smoker   . Smokeless tobacco: Not on file  . Alcohol Use: Not on file  . Drug Use: Not on file  . Sexual Activity: Not on file   Other Topics Concern  . Not on file   Social History Narrative    Family History  Problem Relation Age of Onset  . Alzheimer's disease Mother 6280  . Emphysema Father    Socially he is married and has 3 children, 14 grandchildren and 3 great-grandchildren. There is no tobacco or alcohol use.  ROS General: Negative; No fevers, chills, or night sweats;  HEENT: Positive for 2 transient episodes over the past year with left lateral eye field defect, sinus congestion, difficulty swallowing Pulmonary: Negative; No cough, wheezing, shortness of breath, hemoptysis Cardiovascular: Negative; No chest pain, presyncope, syncope, palpatations GI: Negative; No nausea, vomiting, diarrhea, or abdominal pain GU: Negative; No dysuria, hematuria, or difficulty voiding Musculoskeletal: Negative; no myalgias, joint pain, or weakness Hematologic/Oncology: Negative; no easy bruising,  bleeding Endocrine: Negative; no heat/cold intolerance; no diabetes Neuro: Negative; no changes in balance, headaches Skin: Negative; No rashes or skin lesions Psychiatric: Negative; No behavioral problems, depression Sleep: Positive for sleep apnea.  With BiPAP therapy he is unaware of any breakthrough snoring, daytime sleepiness, hypersomnolence, bruxism, restless legs, hypnogognic hallucinations, no cataplexy Other comprehensive 14 point system review is negative.   PE BP 130/62 mmHg  Pulse 59  Ht 6' (1.829 m)  Wt 210 lb 12.8 oz (95.618 kg)  BMI 28.58 kg/m2  Repeat blood pressure by me was 108/70 supine was 102/68 standing General: Alert, oriented, no distress.  Skin: normal turgor, no rashes HEENT: Normocephalic, atraumatic. Pupils round and reactive;  sclera anicteric;no lid lag.  Nose without nasal septal hypertrophy Mouth/Parynx benign; Mallinpatti scale 3 Neck: No JVD, no carotid bruits with normal carotid upstroke Lungs: clear to ausculatation and percussion; no wheezing or rales Chest wall: Nontender to palpation Heart: RRR, s1 s2 normal 1/6 systolic murmur and a whiff of an aortic insufficiency murmur Abdomen: soft, nontender; no hepatosplenomehaly, BS+; abdominal aorta nontender and not dilated by palpation. Back: No CVA tenderness Pulses 2+ Extremities: no clubbing cyanosis or edema, Homan's sign negative  Neurologic: grossly nonfocal Psychologic: Normal affect and mood  ECG (independently read by me): Sinus bradycardia 59 bpm.  Mild first-degree AV block with a PR interval 212 ms.  QTc interval normal.  No ST segment changes.  June 2015 ECG (independently read by me): sinus rhythm at 59 beats per minute.  Borderline first degree AV block with a PR interval of 204 ms.  ECG: Sinus rhythm at 61 beats per minute. Borderline first degree AV block with PR interval 200 ms. QT interval normal 388 ms.  LABS:  BMET    Component Value Date/Time   NA 140 07/23/2014 0916      Hepatic Function Panel     Component Value Date/Time   PROT 7.1 07/23/2014 0916     CBC    Component Value Date/Time   WBC 6.8 07/23/2014 0916     BNP No results found for: PROBNP  Lipid Panel  No results found for: CHOL   RADIOLOGY: No results found.    ASSESSMENT AND PLAN: Kenneth [name] is a 78 year old gentleman who is a history of hypertension, hyperlipidemia, intermittent peripheral edema, obstructive sleep apnea, currently on BiPAP, as well as remote history of gout. He has documented bilateral renal cysts and is status post TURP. His blood pressure today is well controlled on his current therapy without evidence for orthostasis.  He is on a 4 drug antihypertensive regimen consisting of torsemide 20 mg, Bystolic 10 mg, lisinopril 40 mg in addition to amlodipine 5 mg.  He takes allopurinol 100 mg for gout, but denies any recent episodes.  He is not having any chest pain.  I am concerned that he has had 2 episodes of transient left eye lateral field defect.  Apparently no findings were identified by his ophthalmologist.  He denies any severe headaches.  He is unaware of any field defect involving the right eye.  I have recommended a referral to neurology for further evaluation.  I will schedule him for follow-up carotid studies and will be able to compare these to his prior study.  He did sustain to previous head traumas in the past.  I will defer to neurology if they wish to perform CT of the head or MRI for further evaluation.  I am recommending a lipid panel as well as a TSH level in addition to CMP chemistry panel.  He is stable from a cardiac perspective.  He continues to use his BiPAP therapy and admits to 100% compliance.  He had developed central events on CPAP leading to BiPAP institution.  He denies any breakthrough snoring or residual daytime sleepiness.  I will see him in 6 months for reevaluation.  Time spent: 25 minutes  Lennette Bihari, MD, [facility]   07/24/2014 5:16 PM

## 2014-07-28 ENCOUNTER — Ambulatory Visit (HOSPITAL_COMMUNITY)
Admission: RE | Admit: 2014-07-28 | Discharge: 2014-07-28 | Disposition: A | Discharge status: Home / Self Care | Payer: Medicare Other | Source: Ambulatory Visit | Attending: Cardiovascular Disease | Admitting: Cardiovascular Disease

## 2014-07-28 DIAGNOSIS — H53122 Transient visual loss, left eye: Secondary | ICD-10-CM | POA: Insufficient documentation

## 2014-07-28 NOTE — Progress Notes (Signed)
Carotid Duplex Completed. °Brianna L Mazza,RVT °

## 2014-08-04 ENCOUNTER — Encounter: Payer: Self-pay | Admitting: Cardiovascular Disease

## 2014-08-04 ENCOUNTER — Telehealth: Payer: Self-pay | Admitting: Cardiovascular Disease

## 2014-08-04 NOTE — Telephone Encounter (Signed)
Called patient with Dr. Anne HahnWillis appt date and time and location.  Also mailed out a calendar with the same information to the patient today.

## 2014-08-18 ENCOUNTER — Encounter: Payer: Self-pay | Admitting: Neurology

## 2014-08-18 ENCOUNTER — Ambulatory Visit (INDEPENDENT_AMBULATORY_CARE_PROVIDER_SITE_OTHER): Payer: Medicare Other | Admitting: Neurology

## 2014-08-18 VITALS — BP 124/68 | HR 58 | Ht 72.0 in | Wt 215.0 lb

## 2014-08-18 DIAGNOSIS — G451 Carotid artery syndrome (hemispheric): Secondary | ICD-10-CM

## 2014-08-18 DIAGNOSIS — H5340 Unspecified visual field defects: Secondary | ICD-10-CM

## 2014-08-18 NOTE — Patient Instructions (Signed)
Transient Ischemic Attack  A transient ischemic attack (TIA) is a "warning stroke" that causes stroke-like symptoms. Unlike a stroke, a TIA does not cause permanent damage to the brain. The symptoms of a TIA can happen very fast and do not last long. It is important to know the symptoms of a TIA and what to do. This can help prevent a major stroke or death.  CAUSES   · A TIA is caused by a temporary blockage in an artery in the brain or neck (carotid artery). The blockage does not allow the brain to get the blood supply it needs and can cause different symptoms. The blockage can be caused by either:  ¨ A blood clot.  ¨ Fatty buildup (plaque) in a neck or brain artery.  RISK FACTORS  · High blood pressure (hypertension).  · High cholesterol.  · Diabetes mellitus.  · Heart disease.  · The build up of plaque in the blood vessels (peripheral artery disease or atherosclerosis).  · The build up of plaque in the blood vessels providing blood and oxygen to the brain (carotid artery stenosis).  · An abnormal heart rhythm (atrial fibrillation).  · Obesity.  · Smoking.  · Taking oral contraceptives (especially in combination with smoking).  · Physical inactivity.  · A diet high in fats, salt (sodium), and calories.  · Alcohol use.  · Use of illegal drugs (especially cocaine and methamphetamine).  · Being male.  · Being African American.  · Being over the age of 55.  · Family history of stroke.  · Previous history of blood clots, stroke, TIA, or heart attack.  · Sickle cell disease.  SYMPTOMS   TIA symptoms are the same as a stroke but are temporary. These symptoms usually develop suddenly, or may be newly present upon awakening from sleep:  · Sudden weakness or numbness of the face, arm, or leg, especially on one side of the body.  · Sudden trouble walking or difficulty moving arms or legs.  · Sudden confusion.  · Sudden personality changes.  · Trouble speaking (aphasia) or understanding.  · Difficulty swallowing.  · Sudden  trouble seeing in one or both eyes.  · Double vision.  · Dizziness.  · Loss of balance or coordination.  · Sudden severe headache with no known cause.  · Trouble reading or writing.  · Loss of bowel or bladder control.  · Loss of consciousness.  DIAGNOSIS   Your caregiver may be able to determine the presence or absence of a TIA based on your symptoms, history, and physical exam. Computed tomography (CT scan) of the brain is usually performed to help identify a TIA. Other tests may be done to diagnose a TIA. These tests may include:  · Electrocardiography.  · Continuous heart monitoring.  · Echocardiography.  · Carotid ultrasonography.  · Magnetic resonance imaging (MRI).  · A scan of the brain circulation.  · Blood tests.  PREVENTION   The risk of a TIA can be decreased by appropriately treating high blood pressure, high cholesterol, diabetes, heart disease, and obesity and by quitting smoking, limiting alcohol, and staying physically active.  TREATMENT   Time is of the essence. Since the symptoms of TIA are the same as a stroke, it is important to seek treatment as soon as possible because you may need a medicine to dissolve the clot (thrombolytic) that cannot be given if too much time has passed. Treatment options vary. Treatment options may include rest, oxygen, intravenous (IV) fluids,   and medicines to thin the blood (anticoagulants). Medicines and diet may be used to address diabetes, high blood pressure, and other risk factors. Measures will be taken to prevent short-term and long-term complications, including infection from breathing foreign material into the lungs (aspiration pneumonia), blood clots in the legs, and falls. Treatment options include procedures to either remove plaque in the carotid arteries or dilate carotid arteries that have narrowed due to plaque. Those procedures are:  · Carotid endarterectomy.  · Carotid angioplasty and stenting.  HOME CARE INSTRUCTIONS   · Take all medicines prescribed  by your caregiver. Follow the directions carefully. Medicines may be used to control risk factors for a stroke. Be sure you understand all your medicine instructions.  · You may be told to take aspirin or the anticoagulant warfarin. Warfarin needs to be taken exactly as instructed.  ¨ Taking too much or too little warfarin is dangerous. Too much warfarin increases the risk of bleeding. Too little warfarin continues to allow the risk for blood clots. While taking warfarin, you will need to have regular blood tests to measure your blood clotting time. A PT blood test measures how long it takes for blood to clot. Your PT is used to calculate another value called an INR. Your PT and INR help your caregiver to adjust your dose of warfarin. The dose can change for many reasons. It is critically important that you take warfarin exactly as prescribed.  ¨ Many foods, especially foods high in vitamin K can interfere with warfarin and affect the PT and INR. Foods high in vitamin K include spinach, kale, broccoli, cabbage, collard and turnip greens, brussels sprouts, peas, cauliflower, seaweed, and parsley as well as beef and pork liver, green tea, and soybean oil. You should eat a consistent amount of foods high in vitamin K. Avoid major changes in your diet, or notify your caregiver before changing your diet. Arrange a visit with a dietitian to answer your questions.  ¨ Many medicines can interfere with warfarin and affect the PT and INR. You must tell your caregiver about any and all medicines you take, this includes all vitamins and supplements. Be especially cautious with aspirin and anti-inflammatory medicines. Do not take or discontinue any prescribed or over-the-counter medicine except on the advice of your caregiver or pharmacist.  ¨ Warfarin can have side effects, such as excessive bruising or bleeding. You will need to hold pressure over cuts for longer than usual. Your caregiver or pharmacist will discuss other  potential side effects.  ¨ Avoid sports or activities that may cause injury or bleeding.  ¨ Be mindful when shaving, flossing your teeth, or handling sharp objects.  ¨ Alcohol can change the body's ability to handle warfarin. It is best to avoid alcoholic drinks or consume only very small amounts while taking warfarin. Notify your caregiver if you change your alcohol intake.  ¨ Notify your dentist or other caregivers before procedures.  · Eat a diet that includes 5 or more servings of fruits and vegetables each day. This may reduce the risk of stroke. Certain diets may be prescribed to address high blood pressure, high cholesterol, diabetes, or obesity.  ¨ A low-sodium, low-saturated fat, low-trans fat, low-cholesterol diet is recommended to manage high blood pressure.  ¨ A low-saturated fat, low-trans fat, low-cholesterol, and high-fiber diet may control cholesterol levels.  ¨ A controlled-carbohydrate, controlled-sugar diet is recommended to manage diabetes.  ¨ A reduced-calorie, low-sodium, low-saturated fat, low-trans fat, low-cholesterol diet is recommended to manage obesity.  ·   Maintain a healthy weight.  · Stay physically active. It is recommended that you get at least 30 minutes of activity on most or all days.  · Do not smoke.  · Limit alcohol use even if you are not taking warfarin. Moderate alcohol use is considered to be:  ¨ No more than 2 drinks each day for men.  ¨ No more than 1 drink each day for nonpregnant women.  · Stop drug abuse.  · Home safety. A safe home environment is important to reduce the risk of falls. Your caregiver may arrange for specialists to evaluate your home. Having grab bars in the bedroom and bathroom is often important. Your caregiver may arrange for equipment to be used at home, such as raised toilets and a seat for the shower.  · Follow all instructions for follow-up with your caregiver. This is very important. This includes any referrals and lab tests. Proper follow up can  prevent a stroke or another TIA from occurring.  SEEK MEDICAL CARE IF:  · You have personality changes.  · You have difficulty swallowing.  · You are seeing double.  · You have dizziness.  · You have a fever.  · You have skin breakdown.  SEEK IMMEDIATE MEDICAL CARE IF:   Any of these symptoms may represent a serious problem that is an emergency. Do not wait to see if the symptoms will go away. Get medical help right away. Call your local emergency services (911 in [place] Do not drive yourself to the hospital.  · You have sudden weakness or numbness of the face, arm, or leg, especially on one side of the body.  · You have sudden trouble walking or difficulty moving arms or legs.  · You have sudden confusion.  · You have trouble speaking (aphasia) or understanding.  · You have sudden trouble seeing in one or both eyes.  · You have a loss of balance or coordination.  · You have a sudden, severe headache with no known cause.  · You have new chest pain or an irregular heartbeat.  · You have a partial or total loss of consciousness.  MAKE SURE YOU:   · Understand these instructions.  · Will watch your condition.  · Will get help right away if you are not doing well or get worse.  Document Released: 05/10/2005 Document Revised: 08/05/2013 Document Reviewed: 11/05/2013  [employer]® Patient Information ©2015 [employer]. This information is not intended to replace advice given to you by your health care provider. Make sure you discuss any questions you have with your health care provider.

## 2014-08-18 NOTE — Progress Notes (Signed)
Reason for visit: Transient visual disturbance  Kenneth BickersJames Hascall is a 79 y.o. male  History of present illness:  Mr. Kenneth [name] is a 79 year old right-handed white male with a history of hypertension and dyslipidemia. He had 2 events within the last year associated with transient visual disturbance affecting the left homonymous visual field. He noted some left peripheral visual field disturbance that occurred in January 2015 lasting only 30 seconds, and a second event that was very similar occurring in June 2015 lasting less than 1 minute. He had no other associated symptoms of eye discomfort, headache, numbness or weakness of the face, arms, or legs. He was sitting with both occasions, he does not know about his gait stability. He did not have the obvious speech disturbance. He has seen an ophthalmologist to did not see any issues with the eyes. He was seen by Dr. Tresa EndoKelly, and he was set up for a carotid Doppler study that was unremarkable. He is sent to this office for an evaluation. He was on low-dose aspirin during both of the described events. He does note a history of migraine headaches growing up, but he did not have any visual loss associated with his migraine.  Past Medical History  Diagnosis Date  . HTN (hypertension)   . Dyslipidemia   . Sleep apnea     on BiPap  . Diastolic dysfunction     grade 1 by echo 4/11    Past Surgical History  Procedure Laterality Date  . Transurethral resection of prostate  1997  . Septoplasty  2000  . Tonsillectomy    . Cataract extraction Bilateral     Family History  Problem Relation Age of Onset  . Alzheimer's disease Mother [id number]  . Emphysema Father     Social history:  reports that he has quit smoking. He has never used smokeless tobacco. He reports that he does not drink alcohol or use illicit drugs.  Medications:  Current Outpatient Prescriptions on File Prior to Visit  Medication Sig Dispense Refill  . allopurinol (ZYLOPRIM) 100 MG  tablet Take 100 mg by mouth daily.    Marland Kitchen. amLODipine (NORVASC) 5 MG tablet TAKE 1 TABLET BY MOUTH DAILY 90 tablet 1  . aspirin 81 MG tablet Take 81 mg by mouth daily.    . Cholecalciferol (VITAMIN D-1000 MAX ST) 1000 UNITS tablet Take 1,000 Units by mouth daily.    . diclofenac sodium (VOLTAREN) 1 % GEL Apply 1 application topically as needed.    . ferrous sulfate 325 (65 FE) MG EC tablet Take 325 mg by mouth daily.    . fish oil-omega-3 fatty acids 1000 MG capsule Take 2 g by mouth daily.    Marland Kitchen. lisinopril (PRINIVIL,ZESTRIL) 40 MG tablet TAKE 1 TABLET BY MOUTH DAILY 90 tablet 1  . loratadine (CLARITIN) 10 MG tablet Take 10 mg by mouth daily.    . Multiple Vitamin (MULTIVITAMIN WITH MINERALS) TABS Take 1 tablet by mouth daily.    . nebivolol (BYSTOLIC) 10 MG tablet Take 1 tablet (10 mg total) by mouth daily. 28 tablet 0  . ramelteon (ROZEREM) 8 MG tablet Take 1 tablet by mouth at bedtime.    . rosuvastatin (CRESTOR) 20 MG tablet Take 1 tablet (20 mg total) by mouth daily. 28 tablet 0  . sildenafil (VIAGRA) 50 MG tablet Take 50 mg by mouth as needed for erectile dysfunction.    . tamsulosin (FLOMAX) 0.4 MG CAPS Take 0.4 mg by mouth daily after supper.    .Marland Kitchen  testosterone cypionate (DEPOTESTOTERONE CYPIONATE) 200 MG/ML injection Inject 400 mg into the muscle every 30 (thirty) days.    Marland Kitchen torsemide (DEMADEX) 20 MG tablet Take 1 tablet (20 mg total) by mouth daily. 90 tablet 1   No current facility-administered medications on file prior to visit.     No Known Allergies  ROS:  Out of a complete 14 system review of symptoms, the patient complains only of the following symptoms, and all other reviewed systems are negative.  Swelling in the legs Ringing in the ears Transient visual disturbance Snoring Urination problems Joint pain Allergies, runny nose Insomnia, snoring  Blood pressure 124/68, pulse 58, height 6' (1.829 m), weight 215 lb (97.523 kg).  Physical Exam  General: The patient is  alert and cooperative at the time of the examination.  Eyes: Pupils are equal, round, and reactive to light. Discs are flat bilaterally.  Neck: The neck is supple, no carotid bruits are noted.  Respiratory: The respiratory examination is clear.  Cardiovascular: The cardiovascular examination reveals a regular rate and rhythm, no obvious murmurs or rubs are noted.  Skin: Extremities are without significant edema.  Neurologic Exam  Mental status: The patient is alert and oriented x 3 at the time of the examination. The patient has apparent normal recent and remote memory, with an apparently normal attention span and concentration ability.  Cranial nerves: Facial symmetry is present. There is good sensation of the face to pinprick and soft touch bilaterally. The strength of the facial muscles and the muscles to head turning and shoulder shrug are normal bilaterally. Speech is well enunciated, no aphasia or dysarthria is noted. Extraocular movements are full. Visual fields are full. The tongue is midline, and the patient has symmetric elevation of the soft palate. No obvious hearing deficits are noted.  Motor: The motor testing reveals 5 over 5 strength of all 4 extremities. Good symmetric motor tone is noted throughout.  Sensory: Sensory testing is intact to pinprick, soft touch, vibration sensation, and position sense on all 4 extremities, with exception that there is some mild decrease in vibration sensation on the left great toe. No evidence of extinction is noted.  Coordination: Cerebellar testing reveals good finger-nose-finger and heel-to-shin bilaterally.  Gait and station: Gait is normal. Tandem gait is normal. Romberg is negative. No drift is seen.  Reflexes: Deep tendon reflexes are symmetric and normal bilaterally. Toes are downgoing bilaterally.   Assessment/Plan:  1. Presumed TIA events, left homonymous visual field deficits  The patient has had 2 events of left homonymous  visual field changes that likely represent a TIA event. The episodes were identical to one another, and may represent a fixed large or medium vessel stenosis. The patient will be set up for MRI evaluation of the brain, and MRA of the head. He will remain on low-dose aspirin at this time.  Marlan Palau MD 08/18/2014 7:00 PM  [facility] [address] [place], [place] [address]  Phone [phone] Fax [phone]

## 2014-08-19 ENCOUNTER — Other Ambulatory Visit: Payer: Self-pay | Admitting: Cardiovascular Disease

## 2014-08-19 NOTE — Telephone Encounter (Signed)
Rx(s) sent to pharmacy electronically.  

## 2014-08-28 ENCOUNTER — Ambulatory Visit
Admission: RE | Admit: 2014-08-28 | Discharge: 2014-08-28 | Disposition: A | Discharge status: Home / Self Care | Payer: Medicare Other | Source: Ambulatory Visit | Attending: Neurology | Admitting: Neurology

## 2014-08-28 DIAGNOSIS — H5340 Unspecified visual field defects: Secondary | ICD-10-CM

## 2014-08-28 DIAGNOSIS — G451 Carotid artery syndrome (hemispheric): Secondary | ICD-10-CM

## 2014-08-30 ENCOUNTER — Telehealth: Payer: Self-pay | Admitting: Neurology

## 2014-08-30 NOTE — Telephone Encounter (Signed)
I called the patient. The MRI of the brain shows minimal SVD, and the MRA is OK. He is to stay on the aspirin for now, and call me if further events are noted.   MRI brain 08/28/14:   IMPRESSION: Slightly abnormal MRI scan of the brain showing mild changes of chronic microvascular ischemia. No structural lesion, tumor or infarcts are noted   MRA head 08/28/14:   IMPRESSION: Unremarkable MRA of brain showing no significant stenosis of the large and medium-sized intracranial vessels. There are mild atheromatous changes of the distal branch vessels

## 2014-09-06 ENCOUNTER — Encounter: Payer: Self-pay | Admitting: Neurology

## 2014-09-14 ENCOUNTER — Telehealth: Payer: Self-pay | Admitting: *Deleted

## 2014-09-14 NOTE — Telephone Encounter (Signed)
Patient calling wanting to discuss MRI results. Patient would like to do a face to face with Dr. Anne HahnWillis to discuss other issues he is having.  Lupita LeashDonna it will not let me double book is the reason I am sending this to you.

## 2014-09-14 NOTE — Telephone Encounter (Signed)
Okay to get this patient in the next week or so for a brief revisit. This would be a good 8:00 work in.

## 2014-09-14 NOTE — Telephone Encounter (Signed)
Dr. Anne HahnWillis  Would you like me to schedule this patient for an appointment?

## 2014-09-15 ENCOUNTER — Telehealth: Payer: Self-pay | Admitting: Neurology

## 2014-09-15 MED ORDER — CLOPIDOGREL BISULFATE 75 MG PO TABS: 75.0000 mg | ORAL_TABLET | Freq: Every day | ORAL | Status: DC

## 2014-09-15 NOTE — Telephone Encounter (Signed)
I called the patient. The patient had another episode of transient visual loss, he believes it was only in the left eye. The patient is on low-dose aspirin, I'll switch him to Plavix, he will be seen in office next week.

## 2014-09-15 NOTE — Telephone Encounter (Signed)
Spoke to patient and he is scheduled for 09-22-14 at 0800.

## 2014-09-17 ENCOUNTER — Other Ambulatory Visit: Payer: Self-pay | Admitting: Cardiovascular Disease

## 2014-09-17 NOTE — Telephone Encounter (Signed)
Rx(s) sent to pharmacy electronically.  

## 2014-09-22 ENCOUNTER — Ambulatory Visit (INDEPENDENT_AMBULATORY_CARE_PROVIDER_SITE_OTHER): Payer: Medicare Other | Admitting: Neurology

## 2014-09-22 ENCOUNTER — Encounter: Payer: Self-pay | Admitting: Neurology

## 2014-09-22 VITALS — BP 136/70 | HR 63 | Ht 72.0 in | Wt 213.0 lb

## 2014-09-22 DIAGNOSIS — H5340 Unspecified visual field defects: Secondary | ICD-10-CM

## 2014-09-22 DIAGNOSIS — G453 Amaurosis fugax: Secondary | ICD-10-CM

## 2014-09-22 HISTORY — DX: Amaurosis fugax: G45.3

## 2014-09-22 NOTE — Progress Notes (Signed)
Reason for visit: Amaurosis fugax  Kenneth BickersJames [name] is an 79 y.o. male  History of present illness:  Kenneth [name] is a 79 year old right-handed white male with a history of amaurosis fugax involving the left eye that has occurred on 3 occasions over the last one year. The patient recently had an episode within the last 2 weeks. He indicated that the event lasted greater than 1 minute, and affected the inferior visual field of the left eye only. The episode was painless, and unassociated with other symptoms such as slurred speech, double vision, numbness or weakness of extremities, or gait disturbance. The patient had no slurred speech. The patient indicates that he occasionally will have right frontal sharp jabbing pains, but he reports no headaches. The patient is on aspirin, he is to start Plavix in the near future. He has undergone MRI of the brain that does show mild small vessel ischemic changes, MRA of the head and carotid Doppler studies were unremarkable. He comes in today for an evaluation.  Past Medical History  Diagnosis Date  . HTN (hypertension)   . Dyslipidemia   . Sleep apnea     on BiPap  . Diastolic dysfunction     grade 1 by echo 4/11  . 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 Transurethral resection of prostate  1997  . Septoplasty  2000  . Tonsillectomy    . Cataract extraction Bilateral     Family History  Problem Relation Age of Onset  . Alzheimer's disease Mother [id number]  . Emphysema Father     Social history:  reports that he has quit smoking. He has never used smokeless tobacco. He reports that he does not drink alcohol or use illicit drugs.   No Known Allergies  Medications:  Current Outpatient Prescriptions on File Prior to Visit  Medication Sig Dispense Refill  . allopurinol (ZYLOPRIM) 100 MG tablet Take 100 mg by mouth daily.    Marland Kitchen. amLODipine (NORVASC) 5 MG tablet Take 1 tablet by mouth  daily 90 tablet 3  .  BYSTOLIC 20 MG TABS Take 1 tablet by mouth  daily 90 tablet 3  . Cholecalciferol (VITAMIN D-1000 MAX ST) 1000 UNITS tablet Take 1,000 Units by mouth daily.    . clopidogrel (PLAVIX) 75 MG tablet Take 1 tablet (75 mg total) by mouth daily. 30 tablet 11  . diclofenac sodium (VOLTAREN) 1 % GEL Apply 1 application topically as needed.    . ferrous sulfate 325 (65 FE) MG EC tablet Take 325 mg by mouth daily.    . fish oil-omega-3 fatty acids 1000 MG capsule Take 2 g by mouth daily.    Marland Kitchen. lisinopril (PRINIVIL,ZESTRIL) 40 MG tablet Take 1 tablet by mouth  daily 90 tablet 3  . loratadine (CLARITIN) 10 MG tablet Take 10 mg by mouth daily.    . Multiple Vitamin (MULTIVITAMIN WITH MINERALS) TABS Take 1 tablet by mouth daily.    . ramelteon (ROZEREM) 8 MG tablet Take 1 tablet by mouth at bedtime.    . rosuvastatin (CRESTOR) 20 MG tablet Take 1 tablet (20 mg total) by mouth daily. 28 tablet 0  . tamsulosin (FLOMAX) 0.4 MG CAPS Take 0.4 mg by mouth daily after supper.    . testosterone cypionate (DEPOTESTOTERONE CYPIONATE) 200 MG/ML injection Inject 400 mg into the muscle every 30 (thirty) days.     No current facility-administered medications on file prior to visit.  ROS:  Out of a complete 14 system review of symptoms, the patient complains only of the following symptoms, and all other reviewed systems are negative.  Ringing in the ears Loss of vision Sleep apnea, snoring Joint pain, joint swelling  Blood pressure 136/70, pulse 63, height 6' (1.829 m), weight 213 lb (96.616 kg).  Physical Exam  General: The patient is alert and cooperative at the time of the examination.  Skin: No significant peripheral edema is noted.   Neurologic Exam  Mental status: The patient is oriented x 3.  Cranial nerves: Facial symmetry is present. Speech is normal, no aphasia or dysarthria is noted. Extraocular movements are full. Visual fields are full. Pupils are equal, round, and reactive to light. Discs are  flat bilaterally.  Motor: The patient has good strength in all 4 extremities.  Sensory examination: Soft touch sensation is symmetric on the face, arms, and legs.  Coordination: The patient has good finger-nose-finger and heel-to-shin bilaterally.  Gait and station: The patient has a normal gait. Tandem gait is normal. Romberg is negative. No drift is seen.  Reflexes: Deep tendon reflexes are symmetric.   Assessment/Plan:  1. Amaurosis fugax, OS  The patient will be switched to Plavix, he will stop the aspirin. He will be sent for blood work today to include a sedimentation rate. He will follow-up through this office if needed. Part of the office visit was involved discussing his family history for Alzheimer's disease.  Greater than 50% of the visit was spent answering questions, counseling the patient.   Marlan Palau MD 09/22/2014 8:31 PM  [facility] [address] [place], [place] [address]  Phone [phone] Fax [phone]

## 2014-09-22 NOTE — Patient Instructions (Signed)
Stroke Prevention Some medical conditions and behaviors are associated with an increased chance of having a stroke. You may prevent a stroke by making healthy choices and managing medical conditions. HOW CAN I REDUCE MY RISK OF HAVING A STROKE?   Stay physically active. Get at least 30 minutes of activity on most or all days.  Do not smoke. It may also be helpful to avoid exposure to secondhand smoke.  Limit alcohol use. Moderate alcohol use is considered to be:  No more than 2 drinks per day for men.  No more than 1 drink per day for nonpregnant women.  Eat healthy foods. This involves:  Eating 5 or more servings of fruits and vegetables a day.  Making dietary changes that address high blood pressure (hypertension), high cholesterol, diabetes, or obesity.  Manage your cholesterol levels.  Making food choices that are high in fiber and low in saturated fat, trans fat, and cholesterol may control cholesterol levels.  Take any prescribed medicines to control cholesterol as directed by your health care provider.  Manage your diabetes.  Controlling your carbohydrate and sugar intake is recommended to manage diabetes.  Take any prescribed medicines to control diabetes as directed by your health care provider.  Control your hypertension.  Making food choices that are low in salt (sodium), saturated fat, trans fat, and cholesterol is recommended to manage hypertension.  Take any prescribed medicines to control hypertension as directed by your health care provider.  Maintain a healthy weight.  Reducing calorie intake and making food choices that are low in sodium, saturated fat, trans fat, and cholesterol are recommended to manage weight.  Stop drug abuse.  Avoid taking birth control pills.  Talk to your health care provider about the risks of taking birth control pills if you are over 35 years old, smoke, get migraines, or have ever had a blood clot.  Get evaluated for sleep  disorders (sleep apnea).  Talk to your health care provider about getting a sleep evaluation if you snore a lot or have excessive sleepiness.  Take medicines only as directed by your health care provider.  For some people, aspirin or blood thinners (anticoagulants) are helpful in reducing the risk of forming abnormal blood clots that can lead to stroke. If you have the irregular heart rhythm of atrial fibrillation, you should be on a blood thinner unless there is a good reason you cannot take them.  Understand all your medicine instructions.  Make sure that other conditions (such as anemia or atherosclerosis) are addressed. SEEK IMMEDIATE MEDICAL CARE IF:   You have sudden weakness or numbness of the face, arm, or leg, especially on one side of the body.  Your face or eyelid droops to one side.  You have sudden confusion.  You have trouble speaking (aphasia) or understanding.  You have sudden trouble seeing in one or both eyes.  You have sudden trouble walking.  You have dizziness.  You have a loss of balance or coordination.  You have a sudden, severe headache with no known cause.  You have new chest pain or an irregular heartbeat. Any of these symptoms may represent a serious problem that is an emergency. Do not wait to see if the symptoms will go away. Get medical help at once. Call your local emergency services (911 in [place] Do not drive yourself to the hospital. Document Released: 09/07/2004 Document Revised: 12/15/2013 Document Reviewed: 01/31/2013 ExitCare Patient Information 2015 [employer]. This information is not intended to replace advice given   to you by your health care provider. Make sure you discuss any questions you have with your health care provider.  

## 2014-09-23 LAB — SEDIMENTATION RATE: SED RATE: 6 mm/h (ref 0–30)

## 2014-12-08 ENCOUNTER — Encounter: Payer: Self-pay | Admitting: Cardiovascular Disease

## 2014-12-08 ENCOUNTER — Telehealth: Payer: Self-pay | Admitting: Cardiovascular Disease

## 2014-12-08 NOTE — Telephone Encounter (Signed)
Closed encounter °

## 2015-01-25 ENCOUNTER — Ambulatory Visit: Payer: Medicare Other | Admitting: Cardiovascular Disease

## 2015-01-27 ENCOUNTER — Encounter: Payer: Self-pay | Admitting: Cardiovascular Disease

## 2015-01-27 ENCOUNTER — Other Ambulatory Visit: Payer: Self-pay | Admitting: Cardiovascular Disease

## 2015-01-27 ENCOUNTER — Other Ambulatory Visit: Payer: Self-pay | Admitting: *Deleted

## 2015-01-27 MED ORDER — ROSUVASTATIN CALCIUM 20 MG PO TABS: 20.0000 mg | ORAL_TABLET | Freq: Every day | ORAL | Status: DC

## 2015-01-30 ENCOUNTER — Emergency Department (INDEPENDENT_AMBULATORY_CARE_PROVIDER_SITE_OTHER)
Admission: EM | Admit: 2015-01-30 | Discharge: 2015-01-30 | Disposition: A | Discharge status: Home / Self Care | Payer: Medicare Other | Source: Home / Self Care | Attending: Emergency Medicine | Admitting: Emergency Medicine

## 2015-01-30 ENCOUNTER — Encounter: Payer: Self-pay | Admitting: Emergency Medicine

## 2015-01-30 DIAGNOSIS — H109 Unspecified conjunctivitis: Secondary | ICD-10-CM

## 2015-01-30 MED ORDER — POLYMYXIN B-TRIMETHOPRIM 10000-0.1 UNIT/ML-% OP SOLN: OPHTHALMIC | Status: DC

## 2015-01-30 NOTE — ED Notes (Signed)
Patient presents to [facility] with C/O bilateral eye redness and itching denies visual changes. Vision currently right eye 20/20 and left eye 20/15.

## 2015-01-30 NOTE — ED Provider Notes (Signed)
CSN: [id number]     Arrival date & time 01/30/15  1345  Saturday       [facility] History   First MD Initiated Contact with Patient 01/30/15 1416     Chief Complaint  Patient presents with  . Burning Eyes    HPI Mild Bilateral eye irritation started 1 week ago, right before he left for beach vacation. He called his eye doctor the day he was leaving for vacation, and eye doctor's office advised him to try "OTC Allacor eyedrops". After starting the eyedrops, the symptoms gradually worsened and now the past 2 days, worsening bilateral redness and irritation in both eyes with matted discolored discharge in the left eye this morning. Denies any foreign body sensation. Denies any injury Denies vision change. No other HEENT symptoms. No coryza. No fever or chills or nausea or vomiting or cardiorespiratory symptoms. He denies any other generalized seasonal allergy symptoms Past Medical History  Diagnosis Date  . HTN (hypertension)   . Dyslipidemia   . Sleep apnea     on BiPap  . Diastolic dysfunction     grade 1 by echo 4/11  . 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 Transurethral resection of prostate  1997  . Septoplasty  2000  . Tonsillectomy    . Cataract extraction Bilateral    Family History  Problem Relation Age of Onset  . Alzheimer's disease Mother 26  . Emphysema Father    History  Substance Use Topics  . Smoking status: Former Games developer  . Smokeless tobacco: Never Used  . Alcohol Use: No    Review of Systems  All other systems reviewed and are negative.   Allergies  Review of patient's allergies indicates no known allergies.  Home Medications   Prior to Admission medications   Medication Sig Start Date End Date Taking? Authorizing Provider  allopurinol (ZYLOPRIM) 100 MG tablet Take 100 mg by mouth daily.    Historical Provider, MD  amLODipine (NORVASC) 5 MG tablet Take 1 tablet by mouth  daily 09/17/14    Lennette Bihari, MD  BYSTOLIC 20 MG TABS Take 1 tablet by mouth  daily 09/17/14   Lennette Bihari, MD  Cholecalciferol (VITAMIN D-1000 MAX ST) 1000 UNITS tablet Take 1,000 Units by mouth daily.    Historical Provider, MD  clopidogrel (PLAVIX) 75 MG tablet Take 1 tablet (75 mg total) by mouth daily. 09/15/14   York Spaniel, MD  diclofenac sodium (VOLTAREN) 1 % GEL Apply 1 application topically as needed. 07/31/12   Historical Provider, MD  ferrous sulfate 325 (65 FE) MG EC tablet Take 325 mg by mouth daily.    Historical Provider, MD  fish oil-omega-3 fatty acids 1000 MG capsule Take 2 g by mouth daily.    Historical Provider, MD  lisinopril (PRINIVIL,ZESTRIL) 40 MG tablet Take 1 tablet by mouth  daily 08/19/14   Lennette Bihari, MD  loratadine (CLARITIN) 10 MG tablet Take 10 mg by mouth daily.    Historical Provider, MD  Multiple Vitamin (MULTIVITAMIN WITH MINERALS) TABS Take 1 tablet by mouth daily.    Historical Provider, MD  ramelteon (ROZEREM) 8 MG tablet Take 1 tablet by mouth at bedtime. 10/10/13   Historical Provider, MD  rosuvastatin (CRESTOR) 20 MG tablet Take 1 tablet (20 mg total) by mouth daily. 01/27/15   Chrystie Nose, MD  tamsulosin (FLOMAX) 0.4 MG CAPS Take 0.4 mg by mouth daily after supper.  Historical Provider, MD  testosterone cypionate (DEPOTESTOTERONE CYPIONATE) 200 MG/ML injection Inject 400 mg into the muscle every 30 (thirty) days. 06/04/14 12/31/14  Historical Provider, MD  trimethoprim-polymyxin b (POLYTRIM) ophthalmic solution 2 drop in affected eye(s) every 4 hours (while awake) x 7 days 01/30/15   Lajean Manes, MD   BP 161/73 mmHg  Pulse 70  Temp(Src) 98.6 F (37 C) (Oral)  Resp 16  Ht 6' (1.829 m)  Wt 205 lb (92.987 kg)  BMI 27.80 kg/m2  SpO2 94% Physical Exam  Constitutional: He is oriented to person, place, and time. He appears well-developed and well-nourished. No distress.  HENT:  Head: Normocephalic and atraumatic.  Eyes: EOM are normal. Pupils are equal,  round, and reactive to light. Right eye exhibits no discharge and no exudate. No foreign body present in the right eye. Left eye exhibits discharge (Mild yellow, left). Left eye exhibits no exudate. No foreign body present in the left eye. Right conjunctiva is injected. Left conjunctiva is injected. No scleral icterus.  No foreign body seen. Eyelids within normal limits. Anterior chamber clear bilaterally  Neck: Normal range of motion.  Cardiovascular: Normal rate.   Pulmonary/Chest: Effort normal.  Abdominal: He exhibits no distension.  Musculoskeletal: Normal range of motion.  Neurological: He is alert and oriented to person, place, and time.  Skin: Skin is warm.  Psychiatric: He has a normal mood and affect.  Nursing note and vitals reviewed.  Visual acuity: right eye 20/20 and left eye 20/15. ED Course  Procedures (including critical care time) Labs Review Labs Reviewed - No data to display  Imaging Review No results found.   MDM   1. Bilateral conjunctivitis    Likely infection cause, as the OTC allergy eyedrops were not effective and in fact he worsened on them. Treatment options discussed, as well as risks, benefits, alternatives. Patient voiced understanding and agreement with the following plans: Stop using the OTC allergy eyedrops. Discharge Medication List as of 01/30/2015  2:20 PM    START taking these medications   Details  trimethoprim-polymyxin b (POLYTRIM) ophthalmic solution 2 drop in affected eye(s) every 4 hours (while awake) x 7 days, Normal       other specific and general advice given. Follow-up with your eye doctor in 2 days if not improving, or sooner if symptoms become worse. Precautions discussed. Red flags discussed. Questions invited and answered. Patient voiced understanding and agreement.      Lajean Manes, MD 01/30/15 1426

## 2015-02-01 MED ORDER — ROSUVASTATIN CALCIUM 20 MG PO TABS: 20.0000 mg | ORAL_TABLET | Freq: Every day | ORAL | Status: DC

## 2015-02-01 NOTE — Addendum Note (Signed)
Addended by: Lindell Spar on: 02/01/2015 05:31 PM   Modules accepted: Orders

## 2015-02-05 ENCOUNTER — Telehealth: Payer: Self-pay | Admitting: *Deleted

## 2015-02-05 ENCOUNTER — Ambulatory Visit (INDEPENDENT_AMBULATORY_CARE_PROVIDER_SITE_OTHER): Payer: Medicare Other | Admitting: Cardiovascular Disease

## 2015-02-05 ENCOUNTER — Encounter: Payer: Self-pay | Admitting: Cardiovascular Disease

## 2015-02-05 VITALS — BP 126/70 | HR 63 | Ht 72.0 in | Wt 202.0 lb

## 2015-02-05 DIAGNOSIS — G453 Amaurosis fugax: Secondary | ICD-10-CM | POA: Diagnosis not present

## 2015-02-05 DIAGNOSIS — E785 Hyperlipidemia, unspecified: Secondary | ICD-10-CM

## 2015-02-05 DIAGNOSIS — G4733 Obstructive sleep apnea (adult) (pediatric): Secondary | ICD-10-CM | POA: Diagnosis not present

## 2015-02-05 DIAGNOSIS — H5340 Unspecified visual field defects: Secondary | ICD-10-CM

## 2015-02-05 DIAGNOSIS — I10 Essential (primary) hypertension: Secondary | ICD-10-CM | POA: Diagnosis not present

## 2015-02-05 MED ORDER — ZOLPIDEM TARTRATE 5 MG PO TABS: 5.0000 mg | ORAL_TABLET | Freq: Every day | ORAL | Status: DC

## 2015-02-05 NOTE — Telephone Encounter (Signed)
Faxed order for them to get a BIPAP download and fax report to Dr. Tresa Endo.

## 2015-02-05 NOTE — Patient Instructions (Signed)
Your physician has recommended you make the following change in your medication: start prescription given today for Zolpidem 5 mg.  Your physician wants you to follow-up in: 6 months or sooner if needed. You will receive a reminder letter in the mail two months in advance. If you don't receive a letter, please call our office to schedule the follow-up appointment.  You have been referred to [facility] to get a download on your BIPAP. I WILL SEND A PRESCRIPTION TO THEM FOR THIS.

## 2015-02-06 ENCOUNTER — Encounter: Payer: Self-pay | Admitting: Cardiovascular Disease

## 2015-02-06 NOTE — Progress Notes (Signed)
Patient ID: Kouper Spinella, male   DOB: [birth year], 79 y.o.   MRN: [id number]     HPI: Sione Baumgarten is a 79 y.o. male who presents for a 7 month cardiology evaluation.  Mr. Ostrom has a history of hypertension, hyperlipidemia, gout, and is status post TURP surgery by Dr. [name]. He also has  bilateral renal cysts. He has a history of documented moderate obstructive sleep apnea with an AHI of 22.7 and an RDI of 33 and has been on BiPAP Auto therapy with an EPAP minimum of 10, IPAP maximum potential up to 25 with a delta of for which he started in July 2012. 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  Recently, he has had issues with very dry mouth.  He uses a nasal mask.  He has had intermittent use of a chin strap.  He's not had any recent download.  He believes he is not sleeping as well as he had in the past due to this dry mouth.  He has had some difficulty with sleep initiation.  He is sleeping a aproximatelyy 6 hours per night.  In the past, he had taken Ambien at times to help him fall asleep , but he no longer has a prescription for this and has not been sleeping as well.  He was given a prescription for ramelteon by his primary, but he feels that this has not been beneficial. He denies residual daytime sleepiness. He denies frequent awakenings. His sleep is restorative but at times is not adequate duration.  An echo Doppler study in 05/2013 revealed normal left ventricle wall thickness with normal systolic and diastolic function. He did have mild aortic regurgitation due to poor central leaflet coaptation. There was borderline prolapse of his anterior mitral valve leaflet with trivial mitral regurgitation. He had normal pulmonary pressures assessment artery systolic pressure 19 mm.  He has had issues in the past with peripheral edema, which had led to an increased dose of his torsemide last year.  He denies exertionally precipitated chest pain.  He did experience  one episode of some chest fullness or night when he was sitting on his bed, but this ultimately resolved, and he has not had any further sensation He is unaware of palpitations.  He denies presyncope or syncope.  I last saw him in December, he complained of experiencing transient episodes of a field defect in the lateral left eye.  These were both self-limiting.  He saw an ophthalmologist who recommended that he notify me concerning the symptoms  His history is notable in that he did sustain to prior head injuries many years ago.  Once when he was in high school and had a severe concussion.  Another time occurred when he fell out of a tree stand when he was hunting.  I subtotal referred him to [facility] neurology.  Due to my concern for possible amaurosis fugax.  He saw Dr. [name], who felt that the patient had experienced at least 3 episodes of amaurosis fugax in his left eye.  He underwent an MRI of the brain that showed mild small vessel ischemic changes in the MRA of the head and carotid Doppler studies which were unremarkable.  He was taken off aspirin and started on Plavix 75 mg.  He denies any recurrent symptomatology.  He denies chest pain.  He denies shortness of breath.  He denies paresthesias.  His primary physician is Dr. [name] in [place].  He tells me  his dose of allopurinol was recently increased due to elevated uric acid levels.  Dr. [name] had apparently recently checked a complete set of laboratory.  He has a history of hyperlipidemia and is tolerating Crestor 20 mg.  He has been on amlodipine 5 mg, Bystolic 20 mg, and lisinopril 40 mg for hypertension.  Past Medical History  Diagnosis Date  . HTN (hypertension)   . Dyslipidemia   . Sleep apnea     on BiPap  . Diastolic dysfunction     grade 1 by echo 4/11  . 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 Transurethral resection of prostate  1997  . Septoplasty  2000  .  Tonsillectomy    . Cataract extraction Bilateral     No Known Allergies  Current Outpatient Prescriptions  Medication Sig Dispense Refill  . allopurinol (ZYLOPRIM) 100 MG tablet Take 200 mg by mouth daily.     Marland Kitchen amLODipine (NORVASC) 5 MG tablet Take 1 tablet by mouth  daily 90 tablet 3  . BYSTOLIC 20 MG TABS Take 1 tablet by mouth  daily 90 tablet 3  . Cholecalciferol (VITAMIN D-1000 MAX ST) 1000 UNITS tablet Take 1,000 Units by mouth daily.    . clopidogrel (PLAVIX) 75 MG tablet Take 1 tablet (75 mg total) by mouth daily. 30 tablet 11  . diclofenac sodium (VOLTAREN) 1 % GEL Apply 1 application topically as needed.    . ferrous sulfate 325 (65 FE) MG EC tablet Take 325 mg by mouth daily.    . fish oil-omega-3 fatty acids 1000 MG capsule Take 1,200 mg by mouth daily.     Marland Kitchen lisinopril (PRINIVIL,ZESTRIL) 40 MG tablet Take 1 tablet by mouth  daily 90 tablet 3  . loratadine (CLARITIN) 10 MG tablet Take 10 mg by mouth daily.    . ramelteon (ROZEREM) 8 MG tablet Take 1 tablet by mouth at bedtime.    . rosuvastatin (CRESTOR) 20 MG tablet Take 1 tablet (20 mg total) by mouth daily. 90 tablet 3  . rosuvastatin (CRESTOR) 20 MG tablet Take 1 tablet (20 mg total) by mouth daily. 90 tablet 0  . tamsulosin (FLOMAX) 0.4 MG CAPS Take 0.4 mg by mouth daily after supper.    . trimethoprim-polymyxin b (POLYTRIM) ophthalmic solution 2 drop in affected eye(s) every 4 hours (while awake) x 7 days 10 mL 0  . testosterone cypionate (DEPOTESTOTERONE CYPIONATE) 200 MG/ML injection Inject 400 mg into the muscle every 30 (thirty) days. Takes every three weeks.    Marland Kitchen zolpidem (AMBIEN) 5 MG tablet Take 1 tablet (5 mg total) by mouth at bedtime. 30 tablet 0   No current facility-administered medications for this visit.    History   Social History  . Marital Status: Single    Spouse Name: N/A  . Number of Children: 3  . Years of Education: MD   Occupational History  . retired    Social History Main Topics  .  Smoking status: Former Research scientist (life sciences)  . Smokeless tobacco: Never Used  . Alcohol Use: No  . Drug Use: No  . Sexual Activity: Not on file   Other Topics Concern  . Not on file   Social History Narrative   Patient is right handed.   Patient drinks 2 cups caffeine daily.       Family History  Problem Relation Age of Onset  . Alzheimer's disease Mother 53  . Emphysema Father    Socially he  is married and has 3 children, 14 grandchildren and 3 great-grandchildren. There is no tobacco or alcohol use.  ROS General: Negative; No fevers, chills, or night sweats;  HEENT: Positive for 2 transient episodes over the past year with left lateral eye field defect, sinus congestion, difficulty swallowing Pulmonary: Negative; No cough, wheezing, shortness of breath, hemoptysis Cardiovascular: Negative; No chest pain, presyncope, syncope, palpatations GI: Negative; No nausea, vomiting, diarrhea, or abdominal pain GU: Negative; No dysuria, hematuria, or difficulty voiding Musculoskeletal: Positive for history of gout Hematologic/Oncology: Negative; no easy bruising, bleeding Endocrine: Negative; no heat/cold intolerance; no diabetes Neuro: Positive for amaurosis fugax Skin: Negative; No rashes or skin lesions Psychiatric: Negative; No behavioral problems, depression Sleep: Positive for sleep apnea.  With BiPAP therapy he is unaware of any breakthrough snoring, daytime sleepiness, hypersomnolence, bruxism, restless legs, hypnogognic hallucinations, no cataplexy Other comprehensive 14 point system review is negative.   PE BP 126/70 mmHg  Pulse 63  Ht 6' (1.829 m)  Wt 202 lb (91.627 kg)  BMI 27.39 kg/m2   Wt Readings from Last 3 Encounters:  02/05/15 202 lb (91.627 kg)  01/30/15 205 lb (92.987 kg)  09/22/14 213 lb (96.616 kg)   General: Alert, oriented, no distress.  Skin: normal turgor, no rashes HEENT: Normocephalic, atraumatic. Pupils round and reactive; sclera anicteric;no lid lag.  Nose  without nasal septal hypertrophy Mouth/Parynx benign; Mallinpatti scale 3 Neck: No JVD, no carotid bruits with normal carotid upstroke Lungs: clear to ausculatation and percussion; no wheezing or rales Chest wall: Nontender to palpation Heart: RRR, s1 s2 normal 1/6 systolic murmur and a whiff of an aortic insufficiency murmur Abdomen: soft, nontender; no hepatosplenomehaly, BS+; abdominal aorta nontender and not dilated by palpation. Back: No CVA tenderness Pulses 2+ Extremities: no clubbing cyanosis or edema, Homan's sign negative  Neurologic: grossly nonfocal Psychologic: Normal affect and mood  ECG (independently read by me): Sinus rhythm at 63.  Intervals normal.  No ST segment changes  December 2015 ECG (independently read by me): Sinus bradycardia 59 bpm.  Mild first-degree AV block with a PR interval 212 ms.  QTc interval normal.  No ST segment changes.  June 2015 ECG (independently read by me): sinus rhythm at 59 beats per minute.  Borderline first degree AV block with a PR interval of 204 ms.  ECG: Sinus rhythm at 61 beats per minute. Borderline first degree AV block with PR interval 200 ms. QT interval normal 388 ms.  LABS: BMP Latest Ref Rng 07/23/2014  Glucose 70 - 99 mg/dL 87  BUN 6 - 23 mg/dL 22  Creatinine 0.50 - 1.35 mg/dL 1.38(H)  Sodium 135 - 145 mEq/L 140  Potassium 3.5 - 5.3 mEq/L 4.6  Chloride 96 - 112 mEq/L 104  CO2 19 - 32 mEq/L 29  Calcium 8.4 - 10.5 mg/dL 9.3   Hepatic Function Latest Ref Rng 07/23/2014  Total Protein 6.0 - 8.3 g/dL 7.1  Albumin 3.5 - 5.2 g/dL 4.1  AST 0 - 37 U/L 18  ALT 0 - 53 U/L 15  Alk Phosphatase 39 - 117 U/L 56  Total Bilirubin 0.2 - 1.2 mg/dL 0.7   CBC Latest Ref Rng 07/23/2014  WBC 4.0 - 10.5 K/uL 6.8  Hemoglobin 13.0 - 17.0 g/dL 14.6  Hematocrit 39.0 - 52.0 % 41.2  Platelets 150 - 400 K/uL 170   Lab Results  Component Value Date   MCV 92.0 07/23/2014   Lab Results  Component Value Date   TSH 1.683 07/23/2014  No  results found for: HGBA1C   Lipid Panel   No results found for: CHOL, TRIG, HDL, CHOLHDL, VLDL, LDLCALC, LDLDIRECT   RADIOLOGY: No results found.    ASSESSMENT AND PLAN: Mr. Jenson Beedle is a 79 year old gentleman who is a history of hypertension, hyperlipidemia, intermittent peripheral edema, obstructive sleep apnea, currently on BiPAP, as well as remote history of gout. He has documented bilateral renal cysts and is status post TURP. His blood pressure today is well controlled on his current therapy without evidence for orthostasis.  He had been on a 4 drug antihypertensive regimen consisting of torsemide 20 mg, Bystolic 10 mg, lisinopril 40 mg in addition to amlodipine 5 mg.  He is not taking torsemide daily presently , and there is no edema.  His dose of allopurinol has recently been increased to 200 mg daily due to elevated uric acid levels.  He is not having any chest pain.  He has been felt to have spirits at least 3 episodes of amaurosis fugax.  He now is on Plavix for antiplatelets therapy.  He does not have established CAD and for this reason was taken off baby aspirin.  I will try to obtain the recent blood work from [name].  Target LDL is less than 70.  He has not had a download with reference to his BiPAP therapy for several years.  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  I will ask that they obtain a download so that we can make certain he is appropriately being treated for his complex sleep apnea.  In the past he had developed central events with CPAP therapy.  We discussed increasing his humidification to reduce potential for mouth dryness.  I suspect he may be having some oral venting which may be contributing to his mouth dryness.  He may need another mask style if a chinstrap does not alleviate the problem.  I will review the download when available.  I will see him in 6 months for follow-up evaluation or sooner if problems arise.  Time spent: 25 minutes  Troy Sine, MD,  [facility]  02/06/2015 8:26 AM

## 2015-02-08 ENCOUNTER — Other Ambulatory Visit: Payer: Self-pay

## 2015-02-09 ENCOUNTER — Encounter: Payer: Self-pay | Admitting: Cardiovascular Disease

## 2015-02-09 ENCOUNTER — Telehealth: Payer: Self-pay | Admitting: Cardiovascular Disease

## 2015-02-09 NOTE — Telephone Encounter (Signed)
Spoke with pt, he describes a heavy feeling in the right side of his chest that occurred while at rest. It lasted a couple minutes. No SOB, belching or other symptoms related to heaviness. The heaviness went away on its own. Has no occurred with exertion. Will make dr Tresa Endokelly aware

## 2015-02-09 NOTE — Telephone Encounter (Signed)
Closed encounter °

## 2015-03-03 ENCOUNTER — Telehealth: Payer: Self-pay | Admitting: Cardiovascular Disease

## 2015-03-03 NOTE — Telephone Encounter (Signed)
Kenneth [name] is calling because he was looking at his my chart and is wanting to know about the results of his  ECG , because Mychart ios showing it as a abnormal ECG .Please call   Thanks

## 2015-03-03 NOTE — Telephone Encounter (Signed)
Spoke with patient regarding ECG from last visit with Dr. Tresa EndoKelly. Apparently the attached report showed up in his MyChart and the scanned ECG read "abnormal" as determined by the machine. Assured patient that if ECG was abnormal and Dr. Tresa EndoKelly was concerned about this, he would have addressed this at his appointment. Read to patient the ECG documentation from OV note 02/05/15 "ECG (independently read by me): Sinus rhythm at 63. Intervals normal. No ST segment changes"  Patient voiced understanding and thanked RN for explanation.

## 2015-04-13 ENCOUNTER — Encounter: Payer: Self-pay | Admitting: Neurology

## 2015-05-02 ENCOUNTER — Other Ambulatory Visit: Payer: Self-pay | Admitting: Cardiovascular Disease

## 2015-06-03 ENCOUNTER — Telehealth: Payer: Self-pay | Admitting: Cardiovascular Disease

## 2015-06-03 NOTE — Telephone Encounter (Signed)
Close encounter 

## 2015-07-05 ENCOUNTER — Encounter: Payer: Self-pay | Admitting: Cardiovascular Disease

## 2015-07-05 ENCOUNTER — Ambulatory Visit (INDEPENDENT_AMBULATORY_CARE_PROVIDER_SITE_OTHER): Payer: Medicare Other | Admitting: Cardiovascular Disease

## 2015-07-05 VITALS — BP 102/72 | HR 60 | Ht 72.0 in | Wt 201.6 lb

## 2015-07-05 DIAGNOSIS — Z79899 Other long term (current) drug therapy: Secondary | ICD-10-CM

## 2015-07-05 DIAGNOSIS — Z9079 Acquired absence of other genital organ(s): Secondary | ICD-10-CM

## 2015-07-05 DIAGNOSIS — E785 Hyperlipidemia, unspecified: Secondary | ICD-10-CM | POA: Diagnosis not present

## 2015-07-05 DIAGNOSIS — I10 Essential (primary) hypertension: Secondary | ICD-10-CM | POA: Diagnosis not present

## 2015-07-05 DIAGNOSIS — G4733 Obstructive sleep apnea (adult) (pediatric): Secondary | ICD-10-CM | POA: Diagnosis not present

## 2015-07-05 NOTE — Patient Instructions (Signed)
Your physician recommends that you return for lab work fasting.   Your physician wants you to follow-up in: 6 months or sooner if needed. You will receive a reminder letter in the mail two months in advance. If you don't receive a letter, please call our office to schedule the follow-up appointment.  If you need a refill on your cardiac medications before your next appointment, please call your pharmacy.   

## 2015-07-05 NOTE — Progress Notes (Signed)
Patient ID: Kenneth [name], male   DOB: 1935-10-29, 79 y.o.   MRN: [id number]     HPI: Kenneth [name] is a 79 y.o. male who presents for a 6 month cardiology evaluation.  Kenneth [name] has a history of hypertension, hyperlipidemia, gout, and is status post TURP surgery by Dr. [name]. He also has  bilateral renal cysts. He has a history of documented moderate obstructive sleep apnea with an AHI of 22.7 and an RDI of 33 and has been on BiPAP Auto therapy with an EPAP minimum of 10, IPAP maximum potential up to 25 with a delta of for which he started in July 2012. 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  Recently, he has had issues with very dry mouth.  He uses a nasal mask.  He has had intermittent use of a chin strap.    An echo Doppler study in 05/2013 revealed normal left ventricle wall thickness with normal systolic and diastolic function. He did have mild aortic regurgitation due to poor central leaflet coaptation. There was borderline prolapse of his anterior mitral valve leaflet with trivial mitral regurgitation. He had normal pulmonary pressures assessment artery systolic pressure 19 mm.  He has had issues in the past with peripheral edema, which had led to an increased dose of his torsemide last year.  He denies exertionally precipitated chest pain.  He did experience one episode of some chest fullness or night when he was sitting on his bed, but this ultimately resolved, and he has not had any further sensation He is unaware of palpitations.  He denies presyncope or syncope.  I  saw him in December 2015, he complained of experiencing transient episodes of a field defect in the lateral left eye.  These were both self-limiting.  He saw an ophthalmologist who recommended that he notify me concerning the symptoms  His history is notable in that he did sustain to prior head injuries many years ago.  Once when he was in high school and had a severe concussion.  Another  time occurred when he fell out of a tree stand when he was hunting.  I subtotal referred him to [facility] neurology.  Due to my concern for possible amaurosis fugax.  He saw Dr. [name], who felt that the patient had experienced at least 3 episodes of amaurosis fugax in his left eye.  He underwent an MRI of the brain that showed mild small vessel ischemic changes in the MRA of the head and carotid Doppler studies which were unremarkable.  He was taken off aspirin and started on Plavix 75 mg.  He denies any recurrent symptomatology.  His primary physician is Dr. [name] in [place].   He has a history of hyperlipidemia and is tolerating Crestor 20 mg.  He has been on amlodipine 5 mg, Bystolic 20 mg, and lisinopril 40 mg for hypertension.  Recently, he has been found to have increasing PSA levels with his levels rising from 1.5-3 and most recently to 7.  He is scheduled to have a prostate biopsy by Dr. [name] within the next week.  He denies chest pain, shortness of breath.  There is rare peripheral edema.  He denies dizziness.  He denies palpitations.  Since I last saw him, he underwent cholecystectomy.  Apparently at that time his ECG showed a PR interval at 212 ms, which was of concern by the anesthesiologist.  He tolerated surgery well without cardiovascular compromise.  He presents for evaluation.  Past Medical History  Diagnosis Date  . HTN (hypertension)   . Dyslipidemia   . Sleep apnea     on BiPap  . Diastolic dysfunction     grade 1 by echo 4/11  . 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 Transurethral resection of prostate  1997  . Septoplasty  2000  . Tonsillectomy    . Cataract extraction Bilateral     No Known Allergies  Current Outpatient Prescriptions  Medication Sig Dispense Refill  . allopurinol (ZYLOPRIM) 100 MG tablet Take 200 mg by mouth daily.     Marland Kitchen amLODipine (NORVASC) 5 MG tablet Take 1 tablet by mouth  daily 90 tablet  3  . BYSTOLIC 20 MG TABS Take 1 tablet by mouth  daily 90 tablet 3  . Cholecalciferol (VITAMIN D-1000 MAX ST) 1000 UNITS tablet Take 1,000 Units by mouth daily.    . clopidogrel (PLAVIX) 75 MG tablet Take 1 tablet (75 mg total) by mouth daily. 30 tablet 11  . diclofenac sodium (VOLTAREN) 1 % GEL Apply 1 application topically as needed.    . ferrous sulfate 325 (65 FE) MG EC tablet Take 325 mg by mouth daily.    . fish oil-omega-3 fatty acids 1000 MG capsule Take 1,200 mg by mouth daily.     Marland Kitchen lisinopril (PRINIVIL,ZESTRIL) 40 MG tablet Take 1 tablet by mouth  daily 90 tablet 3  . loratadine (CLARITIN) 10 MG tablet Take 10 mg by mouth daily.    . rosuvastatin (CRESTOR) 20 MG tablet Take 1 tablet (20 mg total) by mouth daily. 90 tablet 0  . tamsulosin (FLOMAX) 0.4 MG CAPS Take 0.4 mg by mouth daily after supper.    . zolpidem (AMBIEN) 5 MG tablet Take 1 tablet (5 mg total) by mouth at bedtime. 30 tablet 0  . testosterone cypionate (DEPOTESTOTERONE CYPIONATE) 200 MG/ML injection Inject 400 mg into the muscle every 30 (thirty) days. Takes every three weeks.     No current facility-administered medications for this visit.    Social History   Social History  . Marital Status: Single    Spouse Name: N/A  . Number of Children: 3  . Years of Education: MD   Occupational History  . retired    Social History Main Topics  . Smoking status: Former Research scientist (life sciences)  . Smokeless tobacco: Never Used  . Alcohol Use: No  . Drug Use: No  . Sexual Activity: Not on file   Other Topics Concern  . Not on file   Social History Narrative   Patient is right handed.   Patient drinks 2 cups caffeine daily.       Family History  Problem Relation Age of Onset  . Alzheimer's disease Mother 41  . Emphysema Father    Socially he is married and has 3 children, 14 grandchildren and 3 great-grandchildren. There is no tobacco or alcohol use.  ROS General: Negative; No fevers, chills, or night sweats;  HEENT:  Positive for 2 transient episodes over the past year with left lateral eye field defect, sinus congestion, difficulty swallowing Pulmonary: Negative; No cough, wheezing, shortness of breath, hemoptysis Cardiovascular: Negative; No chest pain, presyncope, syncope, palpatations GI: Negative; No nausea, vomiting, diarrhea, or abdominal pain GU: Negative; No dysuria, hematuria, or difficulty voiding Musculoskeletal: Positive for history of gout Hematologic/Oncology: Negative; no easy bruising, bleeding Endocrine: Negative; no heat/cold intolerance; no diabetes Neuro: Positive for h/o amaurosis fugax; resolved since Plavix was instituted  Skin: Negative; No rashes or skin lesions Psychiatric: Negative; No behavioral problems, depression Sleep: Positive for sleep apnea.  With BiPAP therapy he is unaware of any breakthrough snoring, daytime sleepiness, hypersomnolence, bruxism, restless legs, hypnogognic hallucinations, no cataplexy Other comprehensive 14 point system review is negative.   PE BP 102/72 mmHg  Pulse 60  Ht 6' (1.829 m)  Wt 201 lb 9 oz (91.428 kg)  BMI 27.33 kg/m2   Wt Readings from Last 3 Encounters:  07/05/15 201 lb 9 oz (91.428 kg)  02/05/15 202 lb (91.627 kg)  01/30/15 205 lb (92.987 kg)   General: Alert, oriented, no distress.  Skin: normal turgor, no rashes HEENT: Normocephalic, atraumatic. Pupils round and reactive; sclera anicteric;no lid lag.  Nose without nasal septal hypertrophy Mouth/Parynx benign; Mallinpatti scale 3 Neck: No JVD, no carotid bruits with normal carotid upstroke Lungs: clear to ausculatation and percussion; no wheezing or rales Chest wall: Nontender to palpation Heart: RRR, s1 s2 normal 1/6 systolic murmur and a whiff of an aortic insufficiency murmur Abdomen: soft, nontender; no hepatosplenomehaly, BS+; abdominal aorta nontender and not dilated by palpation. Back: No CVA tenderness Pulses 2+ Extremities: no clubbing cyanosis or edema, Homan's  sign negative  Neurologic: grossly nonfocal Psychologic: Normal affect and mood  ECG (independently read by me): Normal sinus rhythm at 60 bpm.  PR interval 200 ms.  June 2016 ECG (independently read by me): Sinus rhythm at 63.  Intervals normal.  No ST segment changes  December 2015 ECG (independently read by me): Sinus bradycardia 59 bpm.  Mild first-degree AV block with a PR interval 212 ms.  QTc interval normal.  No ST segment changes.  June 2015 ECG (independently read by me): sinus rhythm at 59 beats per minute.  Borderline first degree AV block with a PR interval of 204 ms.  ECG: Sinus rhythm at 61 beats per minute. Borderline first degree AV block with PR interval 200 ms. QT interval normal 388 ms.  LABS: BMP Latest Ref Rng 07/23/2014  Glucose 70 - 99 mg/dL 87  BUN 6 - 23 mg/dL 22  Creatinine 0.50 - 1.35 mg/dL 1.38(H)  Sodium 135 - 145 mEq/L 140  Potassium 3.5 - 5.3 mEq/L 4.6  Chloride 96 - 112 mEq/L 104  CO2 19 - 32 mEq/L 29  Calcium 8.4 - 10.5 mg/dL 9.3   Hepatic Function Latest Ref Rng 07/23/2014  Total Protein 6.0 - 8.3 g/dL 7.1  Albumin 3.5 - 5.2 g/dL 4.1  AST 0 - 37 U/L 18  ALT 0 - 53 U/L 15  Alk Phosphatase 39 - 117 U/L 56  Total Bilirubin 0.2 - 1.2 mg/dL 0.7   CBC Latest Ref Rng 07/23/2014  WBC 4.0 - 10.5 K/uL 6.8  Hemoglobin 13.0 - 17.0 g/dL 14.6  Hematocrit 39.0 - 52.0 % 41.2  Platelets 150 - 400 K/uL 170   Lab Results  Component Value Date   MCV 92.0 07/23/2014   Lab Results  Component Value Date   TSH 1.683 07/23/2014  No results found for: HGBA1C   Lipid Panel   No results found for: CHOL, TRIG, HDL, CHOLHDL, VLDL, LDLCALC, LDLDIRECT   RADIOLOGY: No results found.    ASSESSMENT AND PLAN: Kenneth [name] is a 79 year old gentleman who is a history of hypertension, hyperlipidemia, intermittent peripheral edema, obstructive sleep apnea, currently on BiPAP, as well as remote history of gout. He has documented bilateral renal cysts and is  status post TURP. His blood pressure today is well controlled on his  current therapy without evidence for orthostasis.  He had been on a 4 drug antihypertensive regimen consisting of torsemide 20 mg, Bystolic 10 mg, lisinopril 40 mg in addition to amlodipine 5 mg.  He is not taking torsemide daily presently , and there is no edema.  His dose of allopurinol has recently been increased to 200 mg daily due to elevated uric acid levels.  He is not having any chest pain.  Since instituting Plavix, he has not had any recurrent episodes of amaurosis fugax.  His ECG today confirms borderline first-degree AV block which is not significant.  He denies any bleeding issues.  He will need to be off the Plavix for at least 5-7 days prior to his prostate biopsy which is to be done by Dr. [name].  He has not had recent laboratory.  In one month I am recommending fasting blood work to assess his medical regimen.  He continues to be on Crestor for hyperlipidemia and is tolerating this without myalgias.  He has not had recent gout episodes.  He is continuing to use BiPAP with 100% compliance.  I will see him in 6 months for cardiology reevaluation.  Time spent: 25 minutes  Troy Sine, MD, [facility]  07/05/2015 6:40 PM

## 2015-07-19 LAB — CBC
HCT: 42.3 % (ref 39.0–52.0)
Hemoglobin: 14.6 g/dL (ref 13.0–17.0)
MCH: 32.3 pg (ref 26.0–34.0)
MCHC: 34.5 g/dL (ref 30.0–36.0)
MCV: 93.6 fL (ref 78.0–100.0)
MPV: 11.8 fL (ref 8.6–12.4)
PLATELETS: 150 10*3/uL (ref 150–400)
RBC: 4.52 MIL/uL (ref 4.22–5.81)
RDW: 14.4 % (ref 11.5–15.5)
WBC: 6 10*3/uL (ref 4.0–10.5)

## 2015-07-20 LAB — COMPREHENSIVE METABOLIC PANEL
ALT: 38 U/L (ref 9–46)
AST: 30 U/L (ref 10–35)
Albumin: 3.8 g/dL (ref 3.6–5.1)
Alkaline Phosphatase: 61 U/L (ref 40–115)
BUN: 26 mg/dL — AB (ref 7–25)
CHLORIDE: 103 mmol/L (ref 98–110)
CO2: 30 mmol/L (ref 20–31)
CREATININE: 1.19 mg/dL — AB (ref 0.70–1.18)
Calcium: 9.1 mg/dL (ref 8.6–10.3)
Glucose, Bld: 91 mg/dL (ref 65–99)
Potassium: 4.4 mmol/L (ref 3.5–5.3)
SODIUM: 142 mmol/L (ref 135–146)
TOTAL PROTEIN: 6.8 g/dL (ref 6.1–8.1)
Total Bilirubin: 1 mg/dL (ref 0.2–1.2)

## 2015-07-20 LAB — LIPID PANEL
CHOLESTEROL: 110 mg/dL — AB (ref 125–200)
HDL: 31 mg/dL — ABNORMAL LOW (ref >=40)
LDL CALC: 52 mg/dL (ref <130)
Total CHOL/HDL Ratio: 3.5 Ratio (ref <=5.0)
Triglycerides: 136 mg/dL (ref <150)
VLDL: 27 mg/dL (ref <30)

## 2015-07-20 LAB — TSH: TSH: 1.145 u[IU]/mL (ref 0.350–4.500)

## 2015-08-04 ENCOUNTER — Ambulatory Visit: Payer: Medicare Other | Admitting: Cardiovascular Disease

## 2015-08-18 ENCOUNTER — Other Ambulatory Visit: Payer: Self-pay | Admitting: Cardiovascular Disease

## 2015-08-18 NOTE — Telephone Encounter (Signed)
Rx request sent to pharmacy.  

## 2015-08-27 ENCOUNTER — Other Ambulatory Visit: Payer: Self-pay | Admitting: Cardiovascular Disease

## 2015-09-03 ENCOUNTER — Telehealth: Payer: Self-pay

## 2015-09-03 MED ORDER — ZOLPIDEM TARTRATE 5 MG PO TABS: 5.0000 mg | ORAL_TABLET | Freq: Every day | ORAL | Status: DC

## 2015-09-03 NOTE — Telephone Encounter (Signed)
°*  STAT* If patient is at the pharmacy, call can be transferred to refill team.   1. Which medications need to be refilled? (please list name of each medication and dose if known) Ambien 5 mg   2. Which pharmacy/location (including street and city if local pharmacy) is medication to be sent to?[facility] in [place]   3. Do they need a 30 day or 90 day supply? 30

## 2015-09-03 NOTE — Telephone Encounter (Signed)
Ambien refill sent to pharmacy.

## 2015-09-16 ENCOUNTER — Other Ambulatory Visit: Payer: Self-pay | Admitting: Neurology

## 2015-10-13 ENCOUNTER — Other Ambulatory Visit: Payer: Self-pay | Admitting: Neurology

## 2015-11-19 ENCOUNTER — Other Ambulatory Visit: Payer: Self-pay | Admitting: Neurology

## 2015-11-30 ENCOUNTER — Encounter: Payer: Self-pay | Admitting: Neurology

## 2015-11-30 ENCOUNTER — Ambulatory Visit (INDEPENDENT_AMBULATORY_CARE_PROVIDER_SITE_OTHER): Payer: Medicare Other | Admitting: Neurology

## 2015-11-30 VITALS — BP 138/72 | HR 80 | Ht 72.0 in | Wt 204.0 lb

## 2015-11-30 DIAGNOSIS — G453 Amaurosis fugax: Secondary | ICD-10-CM | POA: Diagnosis not present

## 2015-11-30 MED ORDER — CLOPIDOGREL BISULFATE 75 MG PO TABS: 75.0000 mg | ORAL_TABLET | Freq: Every day | ORAL | Status: DC

## 2015-11-30 NOTE — Progress Notes (Signed)
Reason for visit: History of amaurosis fugax  Kenneth [name] is an 80 y.o. male  History of present illness:  Kenneth [name] is a 80 year old right-handed white male with a history of amaurosis fugax in the past affecting the left eye. The patient had no residual deficit, he had a workup that did not show any evidence of carotid distribution stenosis. He has been placed on Plavix, he has done well over the last year. He comes back today for reassessment. He has had his gallbladder out since last seen, otherwise he has not had any significant medical issues that have come up. He denies any episodes of numbness, weakness, visual changes, or speech changes since last seen.  Past Medical History  Diagnosis Date  . HTN (hypertension)   . Dyslipidemia   . Sleep apnea     on BiPap  . Diastolic dysfunction     grade 1 by echo 4/11  . 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 Transurethral resection of prostate  1997  . Septoplasty  2000  . Tonsillectomy    . Cataract extraction Bilateral   . Cholecystectomy      Family History  Problem Relation Age of Onset  . Alzheimer's disease Mother 45  . Emphysema Father     Social history:  reports that he has quit smoking. He has never used smokeless tobacco. He reports that he does not drink alcohol or use illicit drugs.   No Known Allergies  Medications:  Prior to Admission medications   Medication Sig Start Date End Date Taking? Authorizing Provider  allopurinol (ZYLOPRIM) 100 MG tablet Take 200 mg by mouth daily.     Historical Provider, MD  amLODipine (NORVASC) 5 MG tablet Take 1 tablet by mouth  daily 08/18/15   Lennette Bihari, MD  BYSTOLIC 20 MG TABS Take 1 tablet by mouth  daily 08/18/15   Lennette Bihari, MD  Cholecalciferol (VITAMIN D-1000 MAX ST) 1000 UNITS tablet Take 1,000 Units by mouth daily.    Historical Provider, MD  clopidogrel (PLAVIX) 75 MG tablet TAKE 1 TABLET BY MOUTH DAILY 11/19/15    York Spaniel, MD  diclofenac sodium (VOLTAREN) 1 % GEL Apply 1 application topically as needed. 07/31/12   Historical Provider, MD  ferrous sulfate 325 (65 FE) MG EC tablet Take 325 mg by mouth daily.    Historical Provider, MD  fish oil-omega-3 fatty acids 1000 MG capsule Take 1,200 mg by mouth daily.     Historical Provider, MD  lisinopril (PRINIVIL,ZESTRIL) 40 MG tablet Take 1 tablet by mouth  daily 08/18/15   Lennette Bihari, MD  loratadine (CLARITIN) 10 MG tablet Take 10 mg by mouth daily.    Historical Provider, MD  rosuvastatin (CRESTOR) 20 MG tablet Take 1 tablet (20 mg total) by mouth daily. 02/01/15   Lennette Bihari, MD  tamsulosin (FLOMAX) 0.4 MG CAPS Take 0.4 mg by mouth daily after supper.    Historical Provider, MD  testosterone cypionate (DEPOTESTOTERONE CYPIONATE) 200 MG/ML injection Inject 400 mg into the muscle every 30 (thirty) days. Takes every three weeks. 06/04/14 12/31/14  Historical Provider, MD  torsemide (DEMADEX) 20 MG tablet Take 1 tablet by mouth  daily 08/18/15   Lennette Bihari, MD  zolpidem (AMBIEN) 5 MG tablet Take 1 tablet (5 mg total) by mouth at bedtime. 09/03/15   Lennette Bihari, MD    ROS:  Out of a complete 14  system review of symptoms, the patient complains only of the following symptoms, and all other reviewed systems are negative.  Ringing in the ears Eye itching Sleep apnea, snoring Joint pain  Blood pressure 138/72, pulse 80, height 6' (1.829 m), weight 204 lb (92.534 kg).  Physical Exam  General: The patient is alert and cooperative at the time of the examination.  Skin: No significant peripheral edema is noted.   Neurologic Exam  Mental status: The patient is alert and oriented x 3 at the time of the examination. The patient has apparent normal recent and remote memory, with an apparently normal attention span and concentration ability.   Cranial nerves: Facial symmetry is present. Speech is normal, no aphasia or dysarthria is noted.  Extraocular movements are full. Visual fields are full. Pupils are equal, round, and reactive to light. Discs are flat bilaterally.  Motor: The patient has good strength in all 4 extremities.  Sensory examination: Soft touch sensation is symmetric on the face, arms, and legs.  Coordination: The patient has good finger-nose-finger and heel-to-shin bilaterally.  Gait and station: The patient has a normal gait. Tandem gait is normal. Romberg is negative. No drift is seen.  Reflexes: Deep tendon reflexes are symmetric.   Assessment/Plan:  1. History of amaurosis fugax, left eye  The patient is doing quite well at this time. We will continue the Plavix, a prescription was written. He will follow-up if needed. If his primary physician is willing to write the prescription for Plavix, the patient could consolidate doctors.  Kenneth [name]. Kenneth Leasha Goldberger MD 11/30/2015 8:17 AM  [facility] [address] [place] [id number]  Phone [phone] Fax [phone]

## 2015-12-15 ENCOUNTER — Other Ambulatory Visit: Payer: Self-pay | Admitting: Cardiovascular Disease

## 2015-12-15 NOTE — Telephone Encounter (Signed)
Rx Refill

## 2016-03-06 ENCOUNTER — Ambulatory Visit (INDEPENDENT_AMBULATORY_CARE_PROVIDER_SITE_OTHER): Payer: Medicare Other | Admitting: Cardiovascular Disease

## 2016-03-06 ENCOUNTER — Encounter: Payer: Self-pay | Admitting: Cardiovascular Disease

## 2016-03-06 VITALS — BP 108/61 | HR 61 | Ht 72.0 in | Wt 203.0 lb

## 2016-03-06 DIAGNOSIS — E785 Hyperlipidemia, unspecified: Secondary | ICD-10-CM

## 2016-03-06 DIAGNOSIS — G4733 Obstructive sleep apnea (adult) (pediatric): Secondary | ICD-10-CM

## 2016-03-06 DIAGNOSIS — Z79899 Other long term (current) drug therapy: Secondary | ICD-10-CM

## 2016-03-06 DIAGNOSIS — R0602 Shortness of breath: Secondary | ICD-10-CM

## 2016-03-06 DIAGNOSIS — I10 Essential (primary) hypertension: Secondary | ICD-10-CM

## 2016-03-06 MED ORDER — ZOLPIDEM TARTRATE 5 MG PO TABS
5.0000 mg | ORAL_TABLET | Freq: Every evening | ORAL | 2 refills | Status: DC | PRN
Start: 2016-03-06 — End: 2016-06-07

## 2016-03-06 NOTE — Patient Instructions (Signed)
Your physician recommends that you return for lab work prior to your next visit.  Your physician has requested that you have a lexiscan myoview. For further information please visit [email or web address] Please follow instruction sheet, as given.   Your physician has requested that you have an echocardiogram. Echocardiography is a painless test that uses sound waves to create images of your heart. It provides your doctor with information about the size and shape of your heart and how well your heart's chambers and valves are working. This procedure takes approximately one hour. There are no restrictions for this procedure.  Your physician recommends that you schedule a follow-up appointment in: 3 months.  Please call back to leave message the name of your BIPAP provider.

## 2016-03-08 ENCOUNTER — Encounter: Payer: Self-pay | Admitting: Cardiovascular Disease

## 2016-03-08 DIAGNOSIS — I10 Essential (primary) hypertension: Secondary | ICD-10-CM | POA: Insufficient documentation

## 2016-03-08 DIAGNOSIS — R0602 Shortness of breath: Secondary | ICD-10-CM | POA: Insufficient documentation

## 2016-03-08 NOTE — Progress Notes (Signed)
Patient ID: Kenneth [name], male   DOB: Dec 31, 1935, 80 y.o.   MRN: [id number]     PCP: [name] at [facility] in [place]  HPI: Kenneth [name] is a 80 y.o. male who presents for a 9 month cardiology evaluation.  Kenneth [name] has a history of hypertension, hyperlipidemia, gout, and is status post TURP surgery by Dr. [name]. He also has  bilateral renal cysts. He has a history of documented moderate obstructive sleep apnea with an AHI of 22.7 and an RDI of 33 and has been on BiPAP Auto therapy with an EPAP minimum of 10, IPAP maximum potential up to 25 with a delta of for which he started in July 2012. 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  Recently, he has had issues with very dry mouth.  He uses a nasal mask.  He has had intermittent use of a chin strap.    An echo Doppler study in 05/2013 revealed normal left ventricle wall thickness with normal systolic and diastolic function. He did have mild aortic regurgitation due to poor central leaflet coaptation. There was borderline prolapse of his anterior mitral valve leaflet with trivial mitral regurgitation. He had normal pulmonary pressures assessment artery systolic pressure 19 mm.  He has had issues in the past with peripheral edema, which had led to an increased dose of his torsemide last year.  He denies exertionally precipitated chest pain.  He did experience one episode of some chest fullness or night when he was sitting on his bed, but this ultimately resolved, and he has not had any further sensation He is unaware of palpitations.  He denies presyncope or syncope.  In December 2015, he complained of experiencing transient episodes of a field defect in the lateral left eye.  These were both self-limiting.  He saw an ophthalmologist who recommended that he notify me concerning the symptoms  His history is notable in that he did sustain to prior head injuries many years ago.  Once when he was in high  school and had a severe concussion.  Another time occurred when he fell out of a tree stand when he was hunting.  I subtotal referred him to [facility] neurology.  Due to my concern for possible amaurosis fugax.  He saw Dr. [name], who felt that the patient had experienced at least 3 episodes of amaurosis fugax in his left eye.  He underwent an MRI of the brain that showed mild small vessel ischemic changes in the MRA of the head and carotid Doppler studies which were unremarkable.  He was taken off aspirin and started on Plavix 75 mg.  He denies any recurrent symptomatology.  His primary physician is Dr. [name] in [place].   He has a history of hyperlipidemia and is tolerating Crestor 20 mg.  He has been on amlodipine 5 mg, Bystolic 20 mg, and lisinopril 40 mg for hypertension.  Recently, he has been found to have increasing PSA levels with his levels rising from 1.5-3 and most recently to 7.  He is scheduled to have a prostate biopsy by Dr. [name] within the next week.  He denies chest pain, shortness of breath.  There is rare peripheral edema.  He denies dizziness.  He denies palpitations.  He underwent cholecystectomy and at that time his ECG showed a PR interval at 212 ms, which was of concern by the anesthesiologist.  He tolerated surgery well without cardiovascular compromise.   Since I last saw  him, he has noticed a change in development of shortness of breath with less activity.  He denies associated chest tightness.  He now feels that he gets short of breath with walking less than 100 yards.  This has been ongoing for several months.  He has noticed some trace swelling in his ankles.  From a sleep perspective, he continues to use his BiPAP therapy.  He has not had any recent download.  He believes his sleep initiation is good but he is having difficulty with sleep maintenance. He uses [employer] for his DME company.  He presents for evaluation.  Past Medical History:  Diagnosis Date  .  Amaurosis fugax 09/22/2014  . Diastolic dysfunction    grade 1 by echo 4/11  . Dyslipidemia   . HTN (hypertension)   . Sleep apnea    on BiPap    Past Surgical History:  Procedure Laterality Date  . CATARACT EXTRACTION Bilateral   . CHOLECYSTECTOMY    . SEPTOPLASTY  2000  . TONSILLECTOMY    . TRANSURETHRAL RESECTION OF PROSTATE  1997    No Known Allergies  Current Outpatient Prescriptions  Medication Sig Dispense Refill  . allopurinol (ZYLOPRIM) 100 MG tablet Take 200 mg by mouth daily.     Marland Kitchen amLODipine (NORVASC) 5 MG tablet Take 1 tablet by mouth  daily 90 tablet 1  . BYSTOLIC 20 MG TABS Take 1 tablet by mouth  daily 90 tablet 1  . Cholecalciferol (VITAMIN D-1000 MAX ST) 1000 UNITS tablet Take 1,000 Units by mouth daily.    . clopidogrel (PLAVIX) 75 MG tablet Take 1 tablet (75 mg total) by mouth daily. 90 tablet 3  . diclofenac sodium (VOLTAREN) 1 % GEL Apply 1 application topically as needed.    . ferrous sulfate 325 (65 FE) MG EC tablet Take 325 mg by mouth daily.    . fish oil-omega-3 fatty acids 1000 MG capsule Take 1,200 mg by mouth daily.     Marland Kitchen lisinopril (PRINIVIL,ZESTRIL) 40 MG tablet Take 1 tablet by mouth  daily 90 tablet 0  . loratadine (CLARITIN) 10 MG tablet Take 10 mg by mouth daily.    . rosuvastatin (CRESTOR) 20 MG tablet Take 1 tablet (20 mg total) by mouth daily. 90 tablet 0  . tamsulosin (FLOMAX) 0.4 MG CAPS Take 0.4 mg by mouth daily after supper.    . torsemide (DEMADEX) 20 MG tablet Take 1 tablet by mouth  daily 90 tablet 1  . zolpidem (AMBIEN) 5 MG tablet Take 1 tablet (5 mg total) by mouth at bedtime as needed for sleep. 30 tablet 2  . testosterone cypionate (DEPOTESTOTERONE CYPIONATE) 200 MG/ML injection Inject 400 mg into the muscle every 30 (thirty) days. Takes every three weeks.     No current facility-administered medications for this visit.     Social History   Social History  . Marital status: Single    Spouse name: Collie Siad  . Number of  children: 3  . Years of education: MD   Occupational History  . Retired    Social History Main Topics  . Smoking status: Former Research scientist (life sciences)  . Smokeless tobacco: Never Used  . Alcohol use No  . Drug use: No  . Sexual activity: Not on file   Other Topics Concern  . Not on file   Social History Narrative   Lives at home w/ his wife   Patient is right handed.   Patient drinks 2 cups caffeine daily.  Family History  Problem Relation Age of Onset  . Alzheimer's disease Mother 8  . Emphysema Father    Socially he is married and has 3 children, 14 grandchildren and 3 great-grandchildren. There is no tobacco or alcohol use.  ROS General: Negative; No fevers, chills, or night sweats;  HEENT: Positive for 2 transient episodes over the past year with left lateral eye field defect, sinus congestion, difficulty swallowing Pulmonary: Negative; No cough, wheezing, shortness of breath, hemoptysis Cardiovascular: See history of present illness GI: Negative; No nausea, vomiting, diarrhea, or abdominal pain GU: Negative; No dysuria, hematuria, or difficulty voiding Musculoskeletal: Positive for history of gout Hematologic/Oncology: Negative; no easy bruising, bleeding Endocrine: Negative; no heat/cold intolerance; no diabetes Neuro: Positive for h/o amaurosis fugax; resolved since Plavix was instituted Skin: Negative; No rashes or skin lesions Psychiatric: Negative; No behavioral problems, depression Sleep: Positive for sleep apnea.  Recent difficulty with sleep maintenance.  With BiPAP therapy he is unaware of any breakthrough snoring, daytime sleepiness, hypersomnolence, bruxism, restless legs, hypnogognic hallucinations, no cataplexy Other comprehensive 14 point system review is negative.   PE BP 108/61   Pulse 61   Ht 6' (1.829 m)   Wt 203 lb (92.1 kg)   BMI 27.53 kg/m    Repeat blood pressure by me 130/68.  Wt Readings from Last 3 Encounters:  03/06/16 203 lb (92.1 kg)    11/30/15 204 lb (92.5 kg)  07/05/15 201 lb 9 oz (91.4 kg)   General: Alert, oriented, no distress.  Skin: normal turgor, no rashes HEENT: Normocephalic, atraumatic. Pupils round and reactive; sclera anicteric;no lid lag.  Nose without nasal septal hypertrophy Mouth/Parynx benign; Mallinpatti scale 3 Neck: No JVD, no carotid bruits with normal carotid upstroke Lungs: clear to ausculatation and percussion; no wheezing or rales Chest wall: Nontender to palpation Heart: RRR, s1 s2 normal 1/6 systolic murmur and a whiff of an aortic insufficiency murmur Abdomen: soft, nontender; no hepatosplenomehaly, BS+; abdominal aorta nontender and not dilated by palpation. Back: No CVA tenderness Pulses 2+ Extremities: no clubbing cyanosis or edema, Homan's sign negative  Neurologic: grossly nonfocal Psychologic: Normal affect and mood  ECG (independently read by me): Normal sinus rhythm at 61 bpm.  Normal QTc with borderline first-degree AV block  November 2016 ECG (independently read by me): Normal sinus rhythm at 60 bpm.  PR interval 200 ms.  June 2016 ECG (independently read by me): Sinus rhythm at 63.  Intervals normal.  No ST segment changes  December 2015 ECG (independently read by me): Sinus bradycardia 59 bpm.  Mild first-degree AV block with a PR interval 212 ms.  QTc interval normal.  No ST segment changes.  June 2015 ECG (independently read by me): sinus rhythm at 59 beats per minute.  Borderline first degree AV block with a PR interval of 204 ms.  ECG: Sinus rhythm at 61 beats per minute. Borderline first degree AV block with PR interval 200 ms. QT interval normal 388 ms.  LABS: BMP Latest Ref Rng & Units 07/19/2015 07/23/2014  Glucose 65 - 99 mg/dL 91 87  BUN 7 - 25 mg/dL 26(H) 22  Creatinine 0.70 - 1.18 mg/dL 1.19(H) 1.38(H)  Sodium 135 - 146 mmol/L 142 140  Potassium 3.5 - 5.3 mmol/L 4.4 4.6  Chloride 98 - 110 mmol/L 103 104  CO2 20 - 31 mmol/L 30 29  Calcium 8.6 - 10.3 mg/dL  9.1 9.3   Hepatic Function Latest Ref Rng & Units 07/19/2015 07/23/2014  Total Protein 6.1 -  8.1 g/dL 6.8 7.1  Albumin 3.6 - 5.1 g/dL 3.8 4.1  AST 10 - 35 U/L 30 18  ALT 9 - 46 U/L 38 15  Alk Phosphatase 40 - 115 U/L 61 56  Total Bilirubin 0.2 - 1.2 mg/dL 1.0 0.7   CBC Latest Ref Rng & Units 07/19/2015 07/23/2014  WBC 4.0 - 10.5 K/uL 6.0 6.8  Hemoglobin 13.0 - 17.0 g/dL 14.6 14.6  Hematocrit 39.0 - 52.0 % 42.3 41.2  Platelets 150 - 400 K/uL 150 170   Lab Results  Component Value Date   MCV 93.6 07/19/2015   MCV 92.0 07/23/2014   Lab Results  Component Value Date   TSH 1.145 07/19/2015  No results found for: HGBA1C   Lipid Panel      Component Value Date/Time   CHOL 110 (L) 07/19/2015 0804   TRIG 136 07/19/2015 0804   HDL 31 (L) 07/19/2015 0804   CHOLHDL 3.5 07/19/2015 0804   VLDL 27 07/19/2015 0804   LDLCALC 52 07/19/2015 0804     RADIOLOGY: No results found.    ASSESSMENT AND PLAN: Kenneth [name] is a 80 year old gentleman who has a history of hypertension, hyperlipidemia, intermittent peripheral edema, obstructive sleep apnea, currently on BiPAP, as well as remote history of gout. He has documented bilateral renal cysts and is status post TURP. His blood pressure today is well controlled on his current therapy without evidence for orthostasis on a 4 drug antihypertensive regimen consisting of torsemide 10 mg, Bystolic 10 mg, lisinopril 40 mg in addition to amlodipine 5 mg.  over the past several months he has noticed a change with reference to shortness of breath with activity.  He now believes he cannot walk 100 yards without having to stop.  He denies associated chest tightness.  His last nuclear stress test was in 2012 and last echo Doppler study was in 2014.  I am recommending that he undergo a follow-up 2-D echo Doppler and will also schedule him for a Lexiscan Myoview study.  Blood work will be obtained in the fasting state.  He continues to use BiPAP for his  complex sleep apnea.  He is having issues with sleep maintenance.  He has not had his BiPAP unit downloaded in some time and we will contact [facility] so that a download campy obtained.  He continues to take Crestor for hyperlipidemia and laboratory in December 2016 was excellent with an LDL of 52.  I will see him in several months for follow up evaluation or sooner if problems arise.  Time spent: 25 minutes  Troy Sine, MD, [facility]  03/08/2016 7:18 PM

## 2016-03-20 LAB — CBC
HEMATOCRIT: 44.6 % (ref 38.5–50.0)
Hemoglobin: 14.9 g/dL (ref 13.2–17.1)
MCH: 31.8 pg (ref 27.0–33.0)
MCHC: 33.4 g/dL (ref 32.0–36.0)
MCV: 95.1 fL (ref 80.0–100.0)
MPV: 11.4 fL (ref 7.5–12.5)
Platelets: 155 10*3/uL (ref 140–400)
RBC: 4.69 MIL/uL (ref 4.20–5.80)
RDW: 15.5 % — AB (ref 11.0–15.0)
WBC: 6.3 10*3/uL (ref 3.8–10.8)

## 2016-03-20 LAB — COMPREHENSIVE METABOLIC PANEL
ALBUMIN: 3.9 g/dL (ref 3.6–5.1)
ALT: 17 U/L (ref 9–46)
AST: 19 U/L (ref 10–35)
Alkaline Phosphatase: 56 U/L (ref 40–115)
BUN: 20 mg/dL (ref 7–25)
CHLORIDE: 104 mmol/L (ref 98–110)
CO2: 31 mmol/L (ref 20–31)
CREATININE: 1.22 mg/dL — AB (ref 0.70–1.18)
Calcium: 9 mg/dL (ref 8.6–10.3)
Glucose, Bld: 97 mg/dL (ref 65–99)
Potassium: 4.2 mmol/L (ref 3.5–5.3)
SODIUM: 143 mmol/L (ref 135–146)
Total Bilirubin: 0.6 mg/dL (ref 0.2–1.2)
Total Protein: 6.8 g/dL (ref 6.1–8.1)

## 2016-03-20 LAB — LIPID PANEL
CHOL/HDL RATIO: 3.8 ratio (ref <=5.0)
CHOLESTEROL: 98 mg/dL — AB (ref 125–200)
HDL: 26 mg/dL — ABNORMAL LOW (ref >=40)
LDL Cholesterol: 51 mg/dL (ref <130)
Triglycerides: 103 mg/dL (ref <150)
VLDL: 21 mg/dL (ref <30)

## 2016-03-20 LAB — TSH: TSH: 2.08 mIU/L (ref 0.40–4.50)

## 2016-04-03 ENCOUNTER — Telehealth: Payer: Self-pay | Admitting: Cardiovascular Disease

## 2016-04-03 NOTE — Telephone Encounter (Signed)
See results note. All questions addressed.

## 2016-04-03 NOTE — Telephone Encounter (Signed)
F/u message  Pt states he received a call from the office. Pt states no Vm was left. Please call back to discuss

## 2016-04-07 ENCOUNTER — Telehealth (HOSPITAL_COMMUNITY): Payer: Self-pay | Admitting: *Deleted

## 2016-04-07 NOTE — Telephone Encounter (Signed)
Close encounter 

## 2016-04-11 ENCOUNTER — Ambulatory Visit (HOSPITAL_COMMUNITY)
Admission: RE | Admit: 2016-04-11 | Discharge: 2016-04-11 | Disposition: A | Discharge status: Home / Self Care | Payer: Medicare Other | Source: Ambulatory Visit | Attending: Internal Medicine | Admitting: Internal Medicine

## 2016-04-11 DIAGNOSIS — Z6827 Body mass index (BMI) 27.0-27.9, adult: Secondary | ICD-10-CM | POA: Diagnosis not present

## 2016-04-11 DIAGNOSIS — R5383 Other fatigue: Secondary | ICD-10-CM | POA: Diagnosis not present

## 2016-04-11 DIAGNOSIS — R0602 Shortness of breath: Secondary | ICD-10-CM | POA: Insufficient documentation

## 2016-04-11 DIAGNOSIS — I10 Essential (primary) hypertension: Secondary | ICD-10-CM | POA: Insufficient documentation

## 2016-04-11 DIAGNOSIS — Z87891 Personal history of nicotine dependence: Secondary | ICD-10-CM | POA: Diagnosis not present

## 2016-04-11 DIAGNOSIS — R079 Chest pain, unspecified: Secondary | ICD-10-CM | POA: Diagnosis not present

## 2016-04-11 DIAGNOSIS — E663 Overweight: Secondary | ICD-10-CM | POA: Insufficient documentation

## 2016-04-11 DIAGNOSIS — R0609 Other forms of dyspnea: Secondary | ICD-10-CM | POA: Insufficient documentation

## 2016-04-11 LAB — MYOCARDIAL PERFUSION IMAGING
CHL CUP RESTING HR STRESS: 55 {beats}/min
CSEPPHR: 75 {beats}/min
LV sys vol: 53 mL
LVDIAVOL: 131 mL (ref 62–150)
SDS: 0
SRS: 1
SSS: 1
TID: 1.12

## 2016-04-11 MED ORDER — REGADENOSON 0.4 MG/5ML IV SOLN
0.4000 mg | Freq: Once | INTRAVENOUS | Status: AC
  Administered 2016-04-11: 0.4 mg via INTRAVENOUS

## 2016-04-11 MED ORDER — TECHNETIUM TC 99M TETROFOSMIN IV KIT
10.3000 | PACK | Freq: Once | INTRAVENOUS | Status: AC | PRN
  Administered 2016-04-11: 10.8 via INTRAVENOUS
  Filled 2016-04-11: qty 10

## 2016-04-11 MED ORDER — TECHNETIUM TC 99M TETROFOSMIN IV KIT
30.4000 | PACK | Freq: Once | INTRAVENOUS | Status: AC | PRN
  Administered 2016-04-11: 30.4 via INTRAVENOUS
  Filled 2016-04-11: qty 30

## 2016-04-12 ENCOUNTER — Other Ambulatory Visit: Payer: Self-pay

## 2016-04-12 ENCOUNTER — Ambulatory Visit (HOSPITAL_COMMUNITY): Payer: Medicare Other | Attending: Cardiovascular Disease

## 2016-04-12 DIAGNOSIS — M1990 Unspecified osteoarthritis, unspecified site: Secondary | ICD-10-CM | POA: Diagnosis not present

## 2016-04-12 DIAGNOSIS — I071 Rheumatic tricuspid insufficiency: Secondary | ICD-10-CM | POA: Diagnosis not present

## 2016-04-12 DIAGNOSIS — Z87891 Personal history of nicotine dependence: Secondary | ICD-10-CM | POA: Insufficient documentation

## 2016-04-12 DIAGNOSIS — I351 Nonrheumatic aortic (valve) insufficiency: Secondary | ICD-10-CM | POA: Insufficient documentation

## 2016-04-12 DIAGNOSIS — R0602 Shortness of breath: Secondary | ICD-10-CM | POA: Insufficient documentation

## 2016-04-12 DIAGNOSIS — E785 Hyperlipidemia, unspecified: Secondary | ICD-10-CM | POA: Insufficient documentation

## 2016-04-12 DIAGNOSIS — I10 Essential (primary) hypertension: Secondary | ICD-10-CM | POA: Diagnosis not present

## 2016-04-12 DIAGNOSIS — I119 Hypertensive heart disease without heart failure: Secondary | ICD-10-CM | POA: Insufficient documentation

## 2016-04-12 DIAGNOSIS — I371 Nonrheumatic pulmonary valve insufficiency: Secondary | ICD-10-CM | POA: Diagnosis not present

## 2016-04-18 ENCOUNTER — Telehealth: Payer: Self-pay | Admitting: *Deleted

## 2016-04-18 NOTE — Telephone Encounter (Signed)
Informed patient's wife per Dr Tresa EndoKelly that his stress test was normal. He can call back if questions or concerns.

## 2016-04-18 NOTE — Telephone Encounter (Signed)
-----   Message from Lennette Biharihomas A Kelly, MD sent at 04/15/2016  1:18 AM EDT ----- Nl nuc

## 2016-04-25 ENCOUNTER — Other Ambulatory Visit: Payer: Self-pay | Admitting: Cardiovascular Disease

## 2016-04-27 ENCOUNTER — Other Ambulatory Visit: Payer: Self-pay

## 2016-04-27 MED ORDER — ROSUVASTATIN CALCIUM 20 MG PO TABS: 20.0000 mg | ORAL_TABLET | Freq: Every day | ORAL | 3 refills | Status: DC

## 2016-04-27 NOTE — Telephone Encounter (Signed)
Rx(s) sent to pharmacy electronically.  

## 2016-05-22 ENCOUNTER — Other Ambulatory Visit: Payer: Self-pay | Admitting: Cardiovascular Disease

## 2016-06-07 ENCOUNTER — Encounter: Payer: Self-pay | Admitting: Cardiovascular Disease

## 2016-06-07 ENCOUNTER — Ambulatory Visit (INDEPENDENT_AMBULATORY_CARE_PROVIDER_SITE_OTHER): Payer: Medicare Other | Admitting: Cardiovascular Disease

## 2016-06-07 VITALS — BP 118/58 | HR 57 | Ht 72.0 in | Wt 200.0 lb

## 2016-06-07 DIAGNOSIS — I10 Essential (primary) hypertension: Secondary | ICD-10-CM | POA: Diagnosis not present

## 2016-06-07 DIAGNOSIS — G453 Amaurosis fugax: Secondary | ICD-10-CM | POA: Diagnosis not present

## 2016-06-07 DIAGNOSIS — E7800 Pure hypercholesterolemia, unspecified: Secondary | ICD-10-CM

## 2016-06-07 DIAGNOSIS — G4733 Obstructive sleep apnea (adult) (pediatric): Secondary | ICD-10-CM | POA: Diagnosis not present

## 2016-06-07 MED ORDER — ZOLPIDEM TARTRATE 5 MG PO TABS: 5.0000 mg | ORAL_TABLET | Freq: Every evening | ORAL | 1 refills | Status: DC | PRN

## 2016-06-07 NOTE — Progress Notes (Signed)
Patient ID: Kenneth [name], male   DOB: 1936-07-14, 80 y.o.   MRN: [id number]     PCP: [name] at [facility] in [place]  HPI: Kenneth [name] is a 80 y.o. male who presents for a 3 month cardiology evaluation.  Kenneth [name] has a history of hypertension, hyperlipidemia, gout, and is status post TURP surgery by Dr. Amalia [name].  He has a residual underactive bladder and as result straight caths himself one time every day.  He states his post void residuals are in the 250-300 cc range.  He also has  bilateral renal cysts. He has a history of documented moderate obstructive sleep apnea with an AHI of 22.7 and an RDI of 33 and has been on BiPAP Auto therapy with an EPAP minimum of 10, IPAP maximum potential up to 25 with a delta of for which he started in July 2012. 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  Recently, he has had issues with very dry mouth.  He uses a nasal mask.  He has had intermittent use of a chin strap.    An echo Doppler study in 05/2013 revealed normal left ventricle wall thickness with normal systolic and diastolic function. He did have mild aortic regurgitation due to poor central leaflet coaptation. There was borderline prolapse of his anterior mitral valve leaflet with trivial mitral regurgitation. He had normal pulmonary pressures assessment artery systolic pressure 19 mm.  He has had issues in the past with peripheral edema, which had led to an increased dose of his torsemide last year.  He denies exertionally precipitated chest pain.  He did experience one episode of some chest fullness or night when he was sitting on his bed, but this ultimately resolved, and he has not had any further sensation He is unaware of palpitations.  He denies presyncope or syncope.  In December 2015, he complained of experiencing transient episodes of a field defect in the lateral left eye.  These were both self-limiting.  He saw an ophthalmologist who  recommended that he notify me concerning the symptoms  His history is notable in that he did sustain to prior head injuries many years ago.  Once when he was in high school and had a severe concussion.  Another time occurred when he fell out of a tree stand when he was hunting.  I subtotal referred him to [name].  Due to my concern for possible amaurosis fugax.  He saw Dr. Floyde [name], who felt that the patient had experienced at least 3 episodes of amaurosis fugax in his left eye.  He underwent an MRI of the brain that showed mild small vessel ischemic changes in the MRA of the head and carotid Doppler studies which were unremarkable.  He was taken off aspirin and started on Plavix 75 mg.  He denies any recurrent symptomatology.  His primary physician is Dr. Janna [name] in [place].   He has a history of hyperlipidemia and is tolerating Crestor 20 mg.  He has been on amlodipine 5 mg, Bystolic 20 mg, and lisinopril 40 mg for hypertension.  Recently, he has been found to have increasing PSA levels with his levels rising from 1.5-3 and most recently to 7.  He is scheduled to have a prostate biopsy by Dr. Amalia [name] within the next week.  He denies chest pain, shortness of breath.  There is rare peripheral edema.  He denies dizziness.  He denies palpitations.  He underwent cholecystectomy and at that  time his ECG showed a PR interval at 212 ms, which was of concern by the anesthesiologist.  He tolerated surgery well without cardiovascular compromise.   When I had last seen him, he had noticed a change in development of shortness of breath with less activity.  He denies associated chest tightness.  He now feels that he gets short of breath with walking less than 100 yards.  This has been ongoing for several months.  He has noticed some trace swelling in his ankles. He underwent a nuclear stress test which was done on 04/11/2016.  Ejection fraction was 59%.  There were no ECG changes.  There was normal  perfusion.  An echo Doppler study on 04/12/2016 showed mild LVH with normal systolic function with very mild grade 1 diastolic dysfunction.  There was mild aortic insufficiency and trivial TR and PR.  Pulmonary pressures were normal.  From a sleep perspective, he continues to use his BiPAP therapy.  He admits to his mouth getting dry during the day.  He has a nasal mask.  He may be having oral venting.  In the past.  He was given a chin strap but has not been consistently using this.  He has not had any recent download.  He believes his sleep initiation is good but he is having difficulty with sleep maintenance. He uses [employer] for his DME company.  He presents for evaluation.  Past Medical History:  Diagnosis Date  . Amaurosis fugax 09/22/2014  . Diastolic dysfunction    grade 1 by echo 4/11  . Dyslipidemia   . HTN (hypertension)   . Sleep apnea    on BiPap    Past Surgical History:  Procedure Laterality Date  . CATARACT EXTRACTION Bilateral   . CHOLECYSTECTOMY    . SEPTOPLASTY  2000  . TONSILLECTOMY    . TRANSURETHRAL RESECTION OF PROSTATE  1997    No Known Allergies  Current Outpatient Prescriptions  Medication Sig Dispense Refill  . allopurinol (ZYLOPRIM) 100 MG tablet Take 200 mg by mouth daily.     Marland Kitchen amLODipine (NORVASC) 5 MG tablet Take 1 tablet by mouth  daily 90 tablet 0  . BYSTOLIC 20 MG TABS Take 1 tablet by mouth  daily 90 tablet 0  . Cholecalciferol (VITAMIN D-1000 MAX ST) 1000 UNITS tablet Take 1,000 Units by mouth daily.    . clopidogrel (PLAVIX) 75 MG tablet Take 1 tablet (75 mg total) by mouth daily. 90 tablet 3  . diclofenac sodium (VOLTAREN) 1 % GEL Apply 1 application topically as needed.    . ferrous sulfate 325 (65 FE) MG EC tablet Take 325 mg by mouth daily.    . fish oil-omega-3 fatty acids 1000 MG capsule Take 1,200 mg by mouth daily.     Marland Kitchen lisinopril (PRINIVIL,ZESTRIL) 40 MG tablet Take 1 tablet by mouth  daily 90 tablet 0  . loratadine (CLARITIN) 10 MG  tablet Take 10 mg by mouth daily.    . rosuvastatin (CRESTOR) 20 MG tablet Take 1 tablet (20 mg total) by mouth daily. 90 tablet 3  . tamsulosin (FLOMAX) 0.4 MG CAPS Take 0.4 mg by mouth daily after supper.    . torsemide (DEMADEX) 20 MG tablet Take 1 tablet by mouth  daily 90 tablet 0  . zolpidem (AMBIEN) 5 MG tablet Take 1 tablet (5 mg total) by mouth at bedtime as needed for sleep. 30 tablet 1  . testosterone cypionate (DEPOTESTOTERONE CYPIONATE) 200 MG/ML injection Inject 400 mg into the  muscle every 30 (thirty) days. Takes every three weeks.     No current facility-administered medications for this visit.     Social History   Social History  . Marital status: Single    Spouse name: Collie Siad  . Number of children: 3  . Years of education: MD   Occupational History  . Retired    Social History Main Topics  . Smoking status: Former Research scientist (life sciences)  . Smokeless tobacco: Never Used  . Alcohol use No  . Drug use: No  . Sexual activity: Not on file   Other Topics Concern  . Not on file   Social History Narrative   Lives at home w/ his wife   Patient is right handed.   Patient drinks 2 cups caffeine daily.       Family History  Problem Relation Age of Onset  . Alzheimer's disease Mother 78  . Emphysema Father    Socially he is married and has 3 children, 14 grandchildren and 3 great-grandchildren. There is no tobacco or alcohol use.  ROS General: Negative; No fevers, chills, or night sweats;  HEENT: Positive for 2 transient episodes over the past year with left lateral eye field defect, sinus congestion, difficulty swallowing Pulmonary: Negative; No cough, wheezing, shortness of breath, hemoptysis Cardiovascular: See history of present illness GI: Negative; No nausea, vomiting, diarrhea, or abdominal pain GU: Negative; No dysuria, hematuria, or difficulty voiding Musculoskeletal: Positive for history of gout Hematologic/Oncology: Negative; no easy bruising, bleeding Endocrine:  Negative; no heat/cold intolerance; no diabetes Neuro: Positive for h/o amaurosis fugax; resolved since Plavix was instituted Skin: Negative; No rashes or skin lesions Psychiatric: Negative; No behavioral problems, depression Sleep: Positive for sleep apnea.  Recent difficulty with sleep maintenance.  With BiPAP therapy he is unaware of any breakthrough snoring, daytime sleepiness, hypersomnolence, bruxism, restless legs, hypnogognic hallucinations, no cataplexy Other comprehensive 14 point system review is negative.   PE BP (!) 118/58   Pulse (!) 57   Ht 6' (1.829 m)   Wt 200 lb (90.7 kg)   BMI 27.12 kg/m    Repeat blood pressure by me 130/68.  Wt Readings from Last 3 Encounters:  06/07/16 200 lb (90.7 kg)  04/11/16 203 lb (92.1 kg)  03/06/16 203 lb (92.1 kg)   General: Alert, oriented, no distress.  Skin: normal turgor, no rashes HEENT: Normocephalic, atraumatic. Pupils round and reactive; sclera anicteric;no lid lag.  Nose without nasal septal hypertrophy Mouth/Parynx benign; Mallinpatti scale 3 Neck: No JVD, no carotid bruits with normal carotid upstroke Lungs: clear to ausculatation and percussion; no wheezing or rales Chest wall: Nontender to palpation Heart: RRR, s1 s2 normal 1/6 systolic murmur and a whiff of an aortic insufficiency murmur Abdomen: soft, nontender; no hepatosplenomehaly, BS+; abdominal aorta nontender and not dilated by palpation. Back: No CVA tenderness Pulses 2+ Extremities: no clubbing cyanosis or edema, Homan's sign negative  Neurologic: grossly nonfocal Psychologic: Normal affect and mood  ECG (independently read by me): Sinus bradycardia at 57 bpm.  PR interval 200 ms.  QTc interval 395 ms.  July 2017 ECG (independently read by me): Normal sinus rhythm at 61 bpm.  Normal QTc with borderline first-degree AV block  November 2016 ECG (independently read by me): Normal sinus rhythm at 60 bpm.  PR interval 200 ms.  June 2016 ECG (independently  read by me): Sinus rhythm at 63.  Intervals normal.  No ST segment changes  December 2015 ECG (independently read by me): Sinus bradycardia 59 bpm.  Mild first-degree AV block with a PR interval 212 ms.  QTc interval normal.  No ST segment changes.  June 2015 ECG (independently read by me): sinus rhythm at 59 beats per minute.  Borderline first degree AV block with a PR interval of 204 ms.  ECG: Sinus rhythm at 61 beats per minute. Borderline first degree AV block with PR interval 200 ms. QT interval normal 388 ms.  LABS: BMP Latest Ref Rng & Units 03/20/2016 07/19/2015 07/23/2014  Glucose 65 - 99 mg/dL 97 91 87  BUN 7 - 25 mg/dL 20 26(H) 22  Creatinine 0.70 - 1.18 mg/dL 1.22(H) 1.19(H) 1.38(H)  Sodium 135 - 146 mmol/L 143 142 140  Potassium 3.5 - 5.3 mmol/L 4.2 4.4 4.6  Chloride 98 - 110 mmol/L 104 103 104  CO2 20 - 31 mmol/L _0 Calcium 8.6 - 10.3 mg/dL 9.0 9.1 9.3   Hepatic Function Latest Ref Rng & Units 03/20/2016 07/19/2015 07/23/2014  Total Protein 6.1 - 8.1 g/dL 6.8 6.8 7.1  Albumin 3.6 - 5.1 g/dL 3.9 3.8 4.1  AST 10 - 35 U/L _1 ALT 9 - 46 U/L 17 38 15  Alk Phosphatase 40 - 115 U/L 56 61 56  Total Bilirubin 0.2 - 1.2 mg/dL 0.6 1.0 0.7   CBC Latest Ref Rng & Units 03/20/2016 07/19/2015 07/23/2014  WBC 3.8 - 10.8 K/uL 6.3 6.0 6.8  Hemoglobin 13.2 - 17.1 g/dL 14.9 14.6 14.6  Hematocrit 38.5 - 50.0 % 44.6 42.3 41.2  Platelets 140 - 400 K/uL 155 150 170   Lab Results  Component Value Date   MCV 95.1 03/20/2016   MCV 93.6 07/19/2015   MCV 92.0 07/23/2014   Lab Results  Component Value Date   TSH 2.08 03/20/2016  No results found for: HGBA1C   Lipid Panel      Component Value Date/Time   CHOL 98 (L) 03/20/2016 0818   TRIG 103 03/20/2016 0818   HDL 26 (L) 03/20/2016 0818   CHOLHDL 3.8 03/20/2016 0818   VLDL 21 03/20/2016 0818   LDLCALC 51 03/20/2016 0818     RADIOLOGY: No results found.    ASSESSMENT AND PLAN: Mr. Kristi Hyer is a 80 year old  gentleman who has a history of hypertension, hyperlipidemia, intermittent peripheral edema, obstructive sleep apnea, currently on BiPAP, as well as remote history of gout. He has documented bilateral renal cysts and is status post TURP.He has an increased postvoid residual and straight caths himself one time every day.  He continues to Dr. Amalia [name] for urologic follow-up.  His blood pressure today is well controlled on his current therapy without evidence for orthostasis on a 4 drug antihypertensive regimen consisting of torsemide 10 mg, Bystolic 10 mg, lisinopril 40 mg in addition to amlodipine 5 mg. When I last saw him, he had noticed a change with reference to shortness of breath with activity.  I reviewed his nuclear stress test and his echo Doppler study with him in detail.  His nuclear study continues to show normal perfusion.  His normal systolic function with grade 1 diastolic dysfunction on his echo Doppler study with mild AR, trivial TR and PR.  Pulmonary pressures are normal.  I reviewed recent blood work from August 2017.  His cholesterol was 98 with LDL of 51, triglycerides 103, VLDL 21 and HDL 26 and he is tolerating Crestor 20 mg.  He is on Plavix, which was started by Dr. Jannifer Franklin after development of amaurosis fugax and he has  not had recurrence since changing from aspirin to Plavix therapy.  He admits to not being very active.  I have encouraged exercising at least 5 days per week for minimum of 30 minutes.  With reference to his sleep apnea.  He continues to experience a dry mouth.  I suggested potentially changing from a nasal mask to a full face mask particular with new mask technology, which he may be able to tolerate per I suspect he is sleeping with his mouth open contributing to his mouth dryness.  He will be following up with his primary physician.  As long as he remains stable I will see him in one year for cardiology reevaluation.  Time spent: 25 minutes  Troy Sine, MD, [facility]    06/07/2016 10:41 AM

## 2016-06-07 NOTE — Patient Instructions (Signed)
Your physician wants you to follow-up in: 1 year or sooner if needed. You will receive a reminder letter in the mail two months in advance. If you don't receive a letter, please call our office to schedule the follow-up appointment.   If you need a refill on your cardiac medications before your next appointment, please call your pharmacy.   

## 2016-07-28 ENCOUNTER — Other Ambulatory Visit: Payer: Self-pay | Admitting: Cardiovascular Disease

## 2016-08-17 ENCOUNTER — Encounter: Payer: Self-pay | Admitting: Cardiovascular Disease

## 2016-08-18 ENCOUNTER — Other Ambulatory Visit: Payer: Self-pay | Admitting: *Deleted

## 2016-08-18 MED ORDER — LISINOPRIL 40 MG PO TABS: 40.0000 mg | ORAL_TABLET | Freq: Every day | ORAL | 3 refills | Status: DC

## 2016-11-01 ENCOUNTER — Other Ambulatory Visit: Payer: Self-pay

## 2016-11-01 MED ORDER — ZOLPIDEM TARTRATE 5 MG PO TABS: 5.0000 mg | ORAL_TABLET | Freq: Every evening | ORAL | 2 refills | Status: DC | PRN

## 2016-11-03 ENCOUNTER — Other Ambulatory Visit: Payer: Self-pay | Admitting: *Deleted

## 2016-11-03 MED ORDER — ZOLPIDEM TARTRATE 5 MG PO TABS: 5.0000 mg | ORAL_TABLET | Freq: Every evening | ORAL | 2 refills | Status: DC | PRN

## 2016-11-03 NOTE — Telephone Encounter (Signed)
`  PHONE IN  /[facility] ORDER FOR REFILL OF ZOLPIDEM 5 MG REFILL X 2   Spoke to pharmacist

## 2016-11-30 ENCOUNTER — Telehealth: Payer: Self-pay | Admitting: *Deleted

## 2016-11-30 NOTE — Telephone Encounter (Signed)
Left message that I was calling to discuss the insurance requesting we change his bystolic. I will call him again on Monday 12/04/16. I am out of the office tomorrow.

## 2016-12-06 ENCOUNTER — Telehealth: Payer: Self-pay | Admitting: Cardiovascular Disease

## 2016-12-06 NOTE — Telephone Encounter (Signed)
New Message     Pt is returning [place] Call about changing medication

## 2016-12-06 NOTE — Telephone Encounter (Signed)
Spoke with pt states that he is returning wanda's call about Bystolic. Pt states that this has happened before and that he can discuss this with wanda at his upcoming appt 12-11-16 @ 220pm

## 2016-12-08 NOTE — Telephone Encounter (Signed)
Agree 

## 2016-12-11 ENCOUNTER — Ambulatory Visit (INDEPENDENT_AMBULATORY_CARE_PROVIDER_SITE_OTHER): Payer: Medicare Other | Admitting: Cardiovascular Disease

## 2016-12-11 ENCOUNTER — Encounter: Payer: Self-pay | Admitting: Cardiovascular Disease

## 2016-12-11 VITALS — BP 106/62 | HR 60 | Ht 72.0 in | Wt 202.0 lb

## 2016-12-11 DIAGNOSIS — G4733 Obstructive sleep apnea (adult) (pediatric): Secondary | ICD-10-CM

## 2016-12-11 DIAGNOSIS — R0602 Shortness of breath: Secondary | ICD-10-CM | POA: Diagnosis not present

## 2016-12-11 DIAGNOSIS — E7800 Pure hypercholesterolemia, unspecified: Secondary | ICD-10-CM | POA: Diagnosis not present

## 2016-12-11 DIAGNOSIS — Z79899 Other long term (current) drug therapy: Secondary | ICD-10-CM | POA: Diagnosis not present

## 2016-12-11 DIAGNOSIS — G453 Amaurosis fugax: Secondary | ICD-10-CM

## 2016-12-11 DIAGNOSIS — I10 Essential (primary) hypertension: Secondary | ICD-10-CM

## 2016-12-11 MED ORDER — SPIRONOLACTONE 25 MG PO TABS: 25.0000 mg | ORAL_TABLET | Freq: Every day | ORAL | 3 refills | Status: DC

## 2016-12-11 NOTE — Patient Instructions (Signed)
Your physician has recommended you make the following change in your medication:   1.) STOP THE TORSEMIDE. This has been replaced with spironolactone.   Your physician recommends that you return for lab work fasting.  Your physician wants you to follow-up in: 6 months or sooner if needed. You will receive a reminder letter in the mail two months in advance. If you don't receive a letter, please call our office to schedule the follow-up appointment.  If you need a refill on your cardiac medications before your next appointment, please call your pharmacy.

## 2016-12-11 NOTE — Progress Notes (Signed)
Patient ID: Kenneth [name], male   DOB: [birth year], 81 y.o.   MRN: [id number]     PCP: [name] at [facility] in [place]  HPI: Kenneth [name] is a 81 y.o. male who presents for a 6 month cardiology evaluation.  Kenneth [name] has a history of hypertension, hyperlipidemia, gout, and is status post TURP surgery by Dr. [name].  He has a residual underactive bladder and as result straight caths himself one time every day.  He states his post void residuals are in the 250-300 cc range.  He also has  bilateral renal cysts. He has a history of documented moderate obstructive sleep apnea with an AHI of 22.7 and an RDI of 33 and has been on BiPAP Auto therapy with an EPAP minimum of 10, IPAP maximum potential up to 25 with a delta of for which he started in July 2012. 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  Recently, he has had issues with very dry mouth.  He uses a nasal mask.  He has had intermittent use of a chin strap.    An echo Doppler study in 05/2013 revealed normal left ventricle wall thickness with normal systolic and diastolic function. He did have mild aortic regurgitation due to poor central leaflet coaptation. There was borderline prolapse of his anterior mitral valve leaflet with trivial mitral regurgitation. He had normal pulmonary pressures assessment artery systolic pressure 19 mm.  He has had issues in the past with peripheral edema, which had led to an increased dose of his torsemide last year.  He denies exertionally precipitated chest pain.  He did experience one episode of some chest fullness or night when he was sitting on his bed, but this ultimately resolved, and he has not had any further sensation He is unaware of palpitations.  He denies presyncope or syncope.  In December 2015, he complained of experiencing transient episodes of a field defect in the lateral left eye.  These were both self-limiting.  He saw an ophthalmologist who  recommended that he notify me concerning the symptoms  His history is notable in that he did sustain to prior head injuries many years ago.  Once when he was in high school and had a severe concussion.  Another time occurred when he fell out of a tree stand when he was hunting.  I subtotal referred him to [facility] neurology.  Due to my concern for possible amaurosis fugax.  He saw Kenneth [name], who felt that the patient had experienced at least 3 episodes of amaurosis fugax in his left eye.  He underwent an MRI of the brain that showed mild small vessel ischemic changes in the MRA of the head and carotid Doppler studies which were unremarkable.  He was taken off aspirin and started on Plavix 75 mg.  He denies any recurrent symptomatology.  His primary physician is Dr. [name] in [place].   He has a history of hyperlipidemia and is tolerating Crestor 20 mg.  He has been on amlodipine 5 mg, Bystolic 20 mg, and lisinopril 40 mg for hypertension.  He was found to have increasing PSA levels with his levels rising from 1.5  to 7 and saw Dr. [name] for biopsy. He underwent cholecystectomy and at that time his ECG showed a PR interval at 212 ms, which was of concern by the anesthesiologist.  He tolerated surgery well without cardiovascular compromise.   When I had last seen him, he had noticed a  change in development of shortness of breath with less activity.  He denies associated chest tightness.  He now feels that he gets short of breath with walking less than 100 yards.  This has been ongoing for several months.  He has noticed some trace swelling in his ankles.  A nuclear stress test which was done on 04/11/2016.  Ejection fraction was 59%.  There were no ECG changes.  There was normal perfusion.  An echo Doppler study on 04/12/2016 showed mild LVH with normal systolic function with very mild grade 1 diastolic dysfunction.  There was mild aortic insufficiency and trivial TR and PR.  Pulmonary pressures  were normal.  From a sleep perspective, he continues to use his BiPAP therapy.  He admits to his mouth getting dry during the day.  He has a nasal mask.  He may be having oral venting.  In the past.  He was given a chin strap but has not been consistently using this.  He has not had any recent download.  He believes his sleep initiation is good but he is having difficulty with sleep maintenance. He uses [employer] for his DME company.    Since I last saw him, he admits to increasing shortness of breath with exertion.  He denies any chest tightness.  He also admits to swelling of his left ankle.  He underwent a lower extremity Doppler study which was negative for DVT in [place].  He can walk a mile without significant abnormalities, but when he has to carry his garbage can to the curb he notes shortness of breath.  He presents for evaluation.  Past Medical History:  Diagnosis Date  . Amaurosis fugax 09/22/2014  . Diastolic dysfunction    grade 1 by echo 4/11  . Dyslipidemia   . HTN (hypertension)   . Sleep apnea    on BiPap    Past Surgical History:  Procedure Laterality Date  . CATARACT EXTRACTION Bilateral   . CHOLECYSTECTOMY    . SEPTOPLASTY  2000  . TONSILLECTOMY    . TRANSURETHRAL RESECTION OF PROSTATE  1997    No Known Allergies  Current Outpatient Prescriptions  Medication Sig Dispense Refill  . allopurinol (ZYLOPRIM) 100 MG tablet Take 200 mg by mouth daily.     Marland Kitchen amLODipine (NORVASC) 5 MG tablet TAKE 1 TABLET BY MOUTH  DAILY 90 tablet 2  . BYSTOLIC 20 MG TABS TAKE 1 TABLET BY MOUTH  DAILY 90 tablet 2  . Cholecalciferol (VITAMIN D-1000 MAX ST) 1000 UNITS tablet Take 1,000 Units by mouth daily.    . clopidogrel (PLAVIX) 75 MG tablet Take 1 tablet (75 mg total) by mouth daily. 90 tablet 3  . diclofenac sodium (VOLTAREN) 1 % GEL Apply 1 application topically as needed.    . ferrous sulfate 325 (65 FE) MG EC tablet Take 325 mg by mouth daily.    . fish oil-omega-3 fatty acids  1000 MG capsule Take 1,200 mg by mouth daily.     Marland Kitchen lisinopril (PRINIVIL,ZESTRIL) 40 MG tablet Take 1 tablet (40 mg total) by mouth daily. 90 tablet 3  . loratadine (CLARITIN) 10 MG tablet Take 10 mg by mouth daily.    . Melatonin 3 MG TABS Take 1 tablet by mouth at bedtime as needed.    . rosuvastatin (CRESTOR) 20 MG tablet Take 1 tablet (20 mg total) by mouth daily. 90 tablet 3  . tamsulosin (FLOMAX) 0.4 MG CAPS Take 0.4 mg by mouth daily after supper.    Marland Kitchen  testosterone cypionate (DEPOTESTOTERONE CYPIONATE) 200 MG/ML injection Inject 400 mg into the muscle every 30 (thirty) days. Takes every three weeks.    Marland Kitchen zolpidem (AMBIEN) 5 MG tablet Take 1 tablet (5 mg total) by mouth at bedtime as needed for sleep. 30 tablet 2  . spironolactone (ALDACTONE) 25 MG tablet Take 1 tablet (25 mg total) by mouth daily. 90 tablet 3   No current facility-administered medications for this visit.     Social History   Social History  . Marital status: Single    Spouse name: Collie Siad  . Number of children: 3  . Years of education: MD   Occupational History  . Retired    Social History Main Topics  . Smoking status: Former Research scientist (life sciences)  . Smokeless tobacco: Never Used  . Alcohol use No  . Drug use: No  . Sexual activity: Not on file   Other Topics Concern  . Not on file   Social History Narrative   Lives at home w/ his wife   Patient is right handed.   Patient drinks 2 cups caffeine daily.       Family History  Problem Relation Age of Onset  . Alzheimer's disease Mother 52  . Emphysema Father    Socially he is married and has 3 children, 14 grandchildren and 3 great-grandchildren. There is no tobacco or alcohol use.  ROS General: Negative; No fevers, chills, or night sweats;  HEENT: Positive for 2 transient episodes over the past year with left lateral eye field defect, sinus congestion, difficulty swallowing Pulmonary: Negative; No cough, wheezing, shortness of breath, hemoptysis Cardiovascular:  See history of present illness GI: Negative; No nausea, vomiting, diarrhea, or abdominal pain GU: Negative; No dysuria, hematuria, or difficulty voiding Musculoskeletal: Positive for history of gout Hematologic/Oncology: Negative; no easy bruising, bleeding Endocrine: Negative; no heat/cold intolerance; no diabetes Neuro: Positive for h/o amaurosis fugax; resolved since Plavix was instituted Skin: Negative; No rashes or skin lesions Psychiatric: Negative; No behavioral problems, depression Sleep: Positive for sleep apnea.  With BiPAP therapy he is unaware of any breakthrough snoring, daytime sleepiness, hypersomnolence, bruxism, restless legs, hypnogognic hallucinations, no cataplexy Other comprehensive 14 point system review is negative.   PE BP 106/62   Pulse 60   Ht 6' (1.829 m)   Wt 202 lb (91.6 kg)   BMI 27.40 kg/m    Repeat blood pressure by me 138/74.  Wt Readings from Last 3 Encounters:  12/11/16 202 lb (91.6 kg)  06/07/16 200 lb (90.7 kg)  04/11/16 203 lb (92.1 kg)   General: Alert, oriented, no distress.  Skin: normal turgor, no rashes HEENT: Normocephalic, atraumatic. Pupils round and reactive; sclera anicteric;no lid lag.  Nose without nasal septal hypertrophy Mouth/Parynx benign; Mallinpatti scale 3 Neck: No JVD, no carotid bruits with normal carotid upstroke Lungs: clear to ausculatation and percussion; no wheezing or rales Chest wall: Nontender to palpation Heart: RRR, s1 s2 normal 1/6 systolic murmur and a whiff of an aortic insufficiency murmur Abdomen: soft, nontender; no hepatosplenomehaly, BS+; abdominal aorta nontender and not dilated by palpation. Back: No CVA tenderness Pulses 2+ Extremities: no clubbing cyanosis or edema, Homan's sign negative  Neurologic: grossly nonfocal Psychologic: Normal affect and mood  ECG (independently read by me): Normal sinus rhythm at 60, normal intervals.  No ST segment changes  October 2017 ECG (independently read by  me): Sinus bradycardia at 57 bpm.  PR interval 200 ms.  QTc interval 395 ms.  July 2017 ECG (independently read by  me): Normal sinus rhythm at 61 bpm.  Normal QTc with borderline first-degree AV block  November 2016 ECG (independently read by me): Normal sinus rhythm at 60 bpm.  PR interval 200 ms.  June 2016 ECG (independently read by me): Sinus rhythm at 63.  Intervals normal.  No ST segment changes  December 2015 ECG (independently read by me): Sinus bradycardia 59 bpm.  Mild first-degree AV block with a PR interval 212 ms.  QTc interval normal.  No ST segment changes.  June 2015 ECG (independently read by me): sinus rhythm at 59 beats per minute.  Borderline first degree AV block with a PR interval of 204 ms.  ECG: Sinus rhythm at 61 beats per minute. Borderline first degree AV block with PR interval 200 ms. QT interval normal 388 ms.  LABS: BMP Latest Ref Rng & Units 03/20/2016 07/19/2015 07/23/2014  Glucose 65 - 99 mg/dL 97 91 87  BUN 7 - 25 mg/dL 20 26(H) 22  Creatinine 0.70 - 1.18 mg/dL 1.22(H) 1.19(H) 1.38(H)  Sodium 135 - 146 mmol/L 143 142 140  Potassium 3.5 - 5.3 mmol/L 4.2 4.4 4.6  Chloride 98 - 110 mmol/L 104 103 104  CO2 20 - 31 mmol/L '31 30 29  ' Calcium 8.6 - 10.3 mg/dL 9.0 9.1 9.3   Hepatic Function Latest Ref Rng & Units 03/20/2016 07/19/2015 07/23/2014  Total Protein 6.1 - 8.1 g/dL 6.8 6.8 7.1  Albumin 3.6 - 5.1 g/dL 3.9 3.8 4.1  AST 10 - 35 U/L '19 30 18  ' ALT 9 - 46 U/L 17 38 15  Alk Phosphatase 40 - 115 U/L 56 61 56  Total Bilirubin 0.2 - 1.2 mg/dL 0.6 1.0 0.7   CBC Latest Ref Rng & Units 03/20/2016 07/19/2015 07/23/2014  WBC 3.8 - 10.8 K/uL 6.3 6.0 6.8  Hemoglobin 13.2 - 17.1 g/dL 14.9 14.6 14.6  Hematocrit 38.5 - 50.0 % 44.6 42.3 41.2  Platelets 140 - 400 K/uL 155 150 170   Lab Results  Component Value Date   MCV 95.1 03/20/2016   MCV 93.6 07/19/2015   MCV 92.0 07/23/2014   Lab Results  Component Value Date   TSH 2.08 03/20/2016  No results found for:  HGBA1C   Lipid Panel      Component Value Date/Time   CHOL 98 (L) 03/20/2016 0818   TRIG 103 03/20/2016 0818   HDL 26 (L) 03/20/2016 0818   CHOLHDL 3.8 03/20/2016 0818   VLDL 21 03/20/2016 0818   LDLCALC 51 03/20/2016 0818     RADIOLOGY: No results found.  IMPRESSION:  1. Essential hypertension   2. Pure hypercholesterolemia   3. Medication management   4. OSA (obstructive sleep apnea)   5. Amaurosis fugax   6. Exertional shortness of breath     ASSESSMENT AND PLAN: Mr. Cordney Barstow is a 81 year old gentleman who has a history of hypertension, hyperlipidemia, intermittent peripheral edema, obstructive sleep apnea, currently on BiPAP, as well as remote history of gout. He has documented bilateral renal cysts and is status post TURP.He has an increased postvoid residual and straight caths himself one time every day.  He continues to Dr. [name] for urologic follow-up.  His blood pressure today is well controlled  torsemide 20 mg, Bystolic 10 mg, lisinopril 40 mg in addition to amlodipine 5 mg. In 2017 he had noticed a change with reference to shortness of breath with activity. A nuclear study showed normal perfusion.  He had normal systolic function with grade 1 diastolic dysfunction on  his echo Doppler study with mild AR, trivial TR and PR.  Pulmonary pressures are normal.  He continues to be on simvastatin 20 mg for hyperlipidemia, and most recent LDL level was excellent at 51.  He is on Plavix, which was started by Dr. Jannifer Franklin after development of amaurosis fugax and he has not had recurrence since changing from aspirin to Plavix therapy.  He has noticed slight increase in exertional dyspnea and at times also admits to some ankle swelling.  With his grade 1 diastolic dysfunction, I have suggested discontinuance of torsemide and changing him to spironolactone 25 mg daily for aldosterone blockade.  He admits to not being very active.  I have encouraged exercising at least 5 days per week  for minimum of 30 minutes.  With reference to his sleep apnea.  He continues to experience a dry mouth.  He admits to 100% compliance with BiPAP.  He is unaware of breakthrough snoring.  I have recommended follow-up fasting laboratory.  Adjustments to his medical therapy will be made if necessary.  As long as he remains stable I will see him in 6 months for reevaluation.  Time spent: 25 minutes  Troy Sine, MD, [facility]  12/13/2016 5:35 PM

## 2016-12-18 LAB — CBC
HEMATOCRIT: 39.5 % (ref 38.5–50.0)
Hemoglobin: 13 g/dL — ABNORMAL LOW (ref 13.2–17.1)
MCH: 31.9 pg (ref 27.0–33.0)
MCHC: 32.9 g/dL (ref 32.0–36.0)
MCV: 96.8 fL (ref 80.0–100.0)
MPV: 11 fL (ref 7.5–12.5)
Platelets: 149 10*3/uL (ref 140–400)
RBC: 4.08 MIL/uL — ABNORMAL LOW (ref 4.20–5.80)
RDW: 15.6 % — AB (ref 11.0–15.0)
WBC: 6.2 10*3/uL (ref 3.8–10.8)

## 2016-12-18 LAB — TSH: TSH: 1.76 m[IU]/L (ref 0.40–4.50)

## 2016-12-19 LAB — LIPID PANEL
CHOL/HDL RATIO: 3 ratio (ref <5.0)
Cholesterol: 83 mg/dL (ref <200)
HDL: 28 mg/dL — AB (ref >40)
LDL CALC: 36 mg/dL (ref <100)
Triglycerides: 93 mg/dL (ref <150)
VLDL: 19 mg/dL (ref <30)

## 2016-12-19 LAB — COMPREHENSIVE METABOLIC PANEL
ALT: 14 U/L (ref 9–46)
AST: 15 U/L (ref 10–35)
Albumin: 3.6 g/dL (ref 3.6–5.1)
Alkaline Phosphatase: 46 U/L (ref 40–115)
BUN: 12 mg/dL (ref 7–25)
CALCIUM: 8.5 mg/dL — AB (ref 8.6–10.3)
CHLORIDE: 110 mmol/L (ref 98–110)
CO2: 25 mmol/L (ref 20–31)
Creat: 1.05 mg/dL (ref 0.70–1.18)
GLUCOSE: 95 mg/dL (ref 65–99)
POTASSIUM: 4.1 mmol/L (ref 3.5–5.3)
Sodium: 142 mmol/L (ref 135–146)
Total Bilirubin: 0.8 mg/dL (ref 0.2–1.2)
Total Protein: 6.1 g/dL (ref 6.1–8.1)

## 2016-12-20 ENCOUNTER — Encounter: Payer: Self-pay | Admitting: Cardiovascular Disease

## 2016-12-22 ENCOUNTER — Telehealth: Payer: Self-pay | Admitting: Cardiovascular Disease

## 2016-12-22 MED ORDER — TORSEMIDE 10 MG PO TABS: 10.0000 mg | ORAL_TABLET | Freq: Every day | ORAL | 3 refills | Status: DC | PRN

## 2016-12-22 MED ORDER — SPIRONOLACTONE 25 MG PO TABS: 12.5000 mg | ORAL_TABLET | Freq: Every day | ORAL | 3 refills | Status: DC

## 2016-12-22 NOTE — Telephone Encounter (Signed)
Follow Up   Pt calling you back

## 2016-12-22 NOTE — Telephone Encounter (Signed)
Follow up    Please call pt so he can clarify instructions

## 2016-12-22 NOTE — Telephone Encounter (Signed)
Results and recommendations discussed with patient, who verbalized understanding and thanks. rx for torsemide sent in for #[phone] as requested by patient. Aware he can take this med daily PRN, & to take the spironlactone daily at the lower dose. Explained rationale for changes to his diuretic dosing. He is aware to call if further concerns.

## 2016-12-22 NOTE — Telephone Encounter (Signed)
New message      Pt c/o medication issue:  1. Name of Medication:  spironolactone  2. How are you currently taking this medication (dosage and times per day)? 25mg  3. Are you having a reaction (difficulty breathing--STAT)? no 4. What is your medication issue?  Ankles are swollen worse than when pt was on the torsemide.  Please advise

## 2016-12-22 NOTE — Telephone Encounter (Signed)
Will call pt after verification from Dr. Royann Shiversroitoru these instructions are OK

## 2016-12-22 NOTE — Telephone Encounter (Signed)
s/w pt he states that his ankles are swelling and would like to switch back to torsemide he thinks that this works better for him, please advise

## 2016-12-22 NOTE — Telephone Encounter (Signed)
Agree with Kenneth [name]'s advice MCr

## 2016-12-22 NOTE — Telephone Encounter (Signed)
Because of his low BP, I would recommend he cut spironolactone from 25 mg to 12.5 mg daily and give him some torsemide 10 mg to take prn edema.  Please review this with the DOD

## 2016-12-22 NOTE — Telephone Encounter (Signed)
s/w pt he states that he does not take his weight daily, then we got disconnected. Called back [id number]

## 2016-12-26 ENCOUNTER — Encounter: Payer: Self-pay | Admitting: Cardiovascular Disease

## 2017-01-17 ENCOUNTER — Other Ambulatory Visit: Payer: Self-pay | Admitting: Neurology

## 2017-01-18 NOTE — Telephone Encounter (Signed)
Called and spoke with patient. Advised it has been over a year since he has been seen at our office. He is going to contact PCP to see if they are willing to refill med. He will call back if they cannot.

## 2017-01-25 ENCOUNTER — Telehealth: Payer: Self-pay | Admitting: *Deleted

## 2017-01-25 NOTE — Telephone Encounter (Signed)
telephoned patient's insurance company to get PA for Bystolic 20 mg. Approved  01/25/17 through to 08/13/17.  Reference # [id number]. Called and left information on patient's home voice mail.

## 2017-03-17 ENCOUNTER — Other Ambulatory Visit: Payer: Self-pay | Admitting: Cardiovascular Disease

## 2017-03-19 NOTE — Telephone Encounter (Signed)
REFILL 

## 2017-04-22 ENCOUNTER — Other Ambulatory Visit: Payer: Self-pay | Admitting: Cardiovascular Disease

## 2017-04-25 ENCOUNTER — Encounter: Payer: Self-pay | Admitting: Cardiovascular Disease

## 2017-04-25 MED ORDER — CLOPIDOGREL BISULFATE 75 MG PO TABS: 75.0000 mg | ORAL_TABLET | Freq: Every day | ORAL | 2 refills | Status: DC

## 2017-05-29 ENCOUNTER — Encounter: Payer: Self-pay | Admitting: Cardiovascular Disease

## 2017-05-29 ENCOUNTER — Ambulatory Visit (INDEPENDENT_AMBULATORY_CARE_PROVIDER_SITE_OTHER): Payer: Medicare Other | Admitting: Cardiovascular Disease

## 2017-05-29 VITALS — BP 114/70 | HR 59 | Ht 72.0 in | Wt 199.4 lb

## 2017-05-29 DIAGNOSIS — R0602 Shortness of breath: Secondary | ICD-10-CM

## 2017-05-29 DIAGNOSIS — I519 Heart disease, unspecified: Secondary | ICD-10-CM

## 2017-05-29 DIAGNOSIS — E7800 Pure hypercholesterolemia, unspecified: Secondary | ICD-10-CM | POA: Diagnosis not present

## 2017-05-29 DIAGNOSIS — I10 Essential (primary) hypertension: Secondary | ICD-10-CM

## 2017-05-29 DIAGNOSIS — G4733 Obstructive sleep apnea (adult) (pediatric): Secondary | ICD-10-CM

## 2017-05-29 DIAGNOSIS — I5189 Other ill-defined heart diseases: Secondary | ICD-10-CM

## 2017-05-29 NOTE — Patient Instructions (Signed)

## 2017-05-29 NOTE — Progress Notes (Signed)
Patient ID: Kenneth [name], male   DOB: 12/03/1935, 81 y.o.   MRN: [id number]     PCP: [name] at [facility] in [place]  HPI: Kenneth [name] is a 81 y.o. male who presents for a 6 month cardiology evaluation.  Kenneth [name] has a history of hypertension, hyperlipidemia, gout, and is status post TURP surgery by Dr. [name].  He has a residual underactive bladder and as result straight caths himself one time every day.  He states his post void residuals are in the 250-300 cc range.  He also has  bilateral renal cysts. He has a history of documented moderate obstructive sleep apnea with an AHI of 22.7 and an RDI of 33 and has been on BiPAP Auto therapy with an EPAP minimum of 10, IPAP maximum potential up to 25 with a delta of for which he started in July 2012. 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  Recently, he has had issues with very dry mouth.  He uses a nasal mask.  He has had intermittent use of a chin strap.    An echo Doppler study in 05/2013 revealed normal left ventricle wall thickness with normal systolic and diastolic function. He did have mild aortic regurgitation due to poor central leaflet coaptation. There was borderline prolapse of his anterior mitral valve leaflet with trivial mitral regurgitation. He had normal pulmonary pressures assessment artery systolic pressure 19 mm.  He has had issues in the past with peripheral edema, which had led to an increased dose of his torsemide last year.  He denies exertionally precipitated chest pain.  He did experience one episode of some chest fullness or night when he was sitting on his bed, but this ultimately resolved, and he has not had any further sensation He is unaware of palpitations.  He denies presyncope or syncope.  In December 2015, he complained of experiencing transient episodes of a field defect in the lateral left eye.  These were both self-limiting.  He saw an ophthalmologist who  recommended that he notify me concerning the symptoms  His history is notable in that he did sustain to prior head injuries many years ago.  Once when he was in high school and had a severe concussion.  Another time occurred when he fell out of a tree stand when he was hunting.  I subtotal referred him to [facility] neurology.  Due to my concern for possible amaurosis fugax.  He saw Dr. [name], who felt that the patient had experienced at least 3 episodes of amaurosis fugax in his left eye.  He underwent an MRI of the brain that showed mild small vessel ischemic changes in the MRA of the head and carotid Doppler studies which were unremarkable.  He was taken off aspirin and started on Plavix 75 mg.  He denies any recurrent symptomatology.  His primary physician is Dr. [name] in [place].   He has a history of hyperlipidemia and is tolerating Crestor 20 mg.  He has been on amlodipine 5 mg, Bystolic 20 mg, and lisinopril 40 mg for hypertension.  He was found to have increasing PSA levels with his levels rising from 1.5  to 7 and saw Dr. [name] for biopsy. He underwent cholecystectomy and at that time his ECG showed a PR interval at 212 ms, which was of concern by the anesthesiologist.  He tolerated surgery well without cardiovascular compromise.   When I had last seen him, he had noticed a  change in development of shortness of breath with less activity.  He denies associated chest tightness.  He now feels that he gets short of breath with walking less than 100 yards.  This has been ongoing for several months.  He has noticed some trace swelling in his ankles.  A nuclear stress test which was done on 04/11/2016.  Ejection fraction was 59%.  There were no ECG changes.  There was normal perfusion.  An echo Doppler study on 04/12/2016 showed mild LVH with normal systolic function with very mild grade 1 diastolic dysfunction.  There was mild aortic insufficiency and trivial TR and PR.  Pulmonary pressures  were normal.  From a sleep perspective, he continues to use his BiPAP therapy.  He admits to his mouth getting dry during the day.  He has a nasal mask.  He may be having oral venting.  In the past.  He was given a chin strap but has not been consistently using this.  He has not had any recent download.  He believes his sleep initiation is good but he is having difficulty with sleep maintenance. He uses [employer] for his DME company.    When I last saw him, he had noticed more shortness of breath with exertion.  He denied any chest tightness.  He had experienced swelling in his lower extremity and a lower extremity Doppler study was negative for DVT in [place].  He was able to walk a mile without significant abnormalities, but when he has to carry his garbage can to the curb he notes shortness of breath.    Since his last office visit in April 2018, he has continued to be without chest pain.  There is no change in his previous shortness of breath.  I had attempted to initiate spironolactone with his diastolic dysfunction but apparently during my absence heat, cold, the office and was concerned that he had not noticed any significant change.  He was advised to go back and take one half of his previous dose of torsemide and one half of spironolactone.  He has been doing this and actually he has felt somewhat better.  He continues to take lisinopril 40 mg, Bystolic 20 mg, and amlodipine for hypertension.  He denies significant swelling.  He is on Crestor for hyperlipidemia.  He presents for evaluation  Past Medical History:  Diagnosis Date  . Amaurosis fugax 09/22/2014  . Diastolic dysfunction    grade 1 by echo 4/11  . Dyslipidemia   . HTN (hypertension)   . Sleep apnea    on BiPap    Past Surgical History:  Procedure Laterality Date  . CATARACT EXTRACTION Bilateral   . CHOLECYSTECTOMY    . SEPTOPLASTY  2000  . TONSILLECTOMY    . TRANSURETHRAL RESECTION OF PROSTATE  1997    No Known  Allergies  Current Outpatient Prescriptions  Medication Sig Dispense Refill  . allopurinol (ZYLOPRIM) 100 MG tablet Take 200 mg by mouth daily.     Marland Kitchen amLODipine (NORVASC) 5 MG tablet TAKE 1 TABLET BY MOUTH  DAILY 90 tablet 1  . BYSTOLIC 20 MG TABS TAKE 1 TABLET BY MOUTH  DAILY 90 tablet 2  . Cholecalciferol (VITAMIN D-1000 MAX ST) 1000 UNITS tablet Take 1,000 Units by mouth daily.    . clopidogrel (PLAVIX) 75 MG tablet Take 1 tablet (75 mg total) by mouth daily. 90 tablet 2  . diclofenac sodium (VOLTAREN) 1 % GEL Apply 1 application topically as needed.    Marland Kitchen  ferrous sulfate 325 (65 FE) MG EC tablet Take 325 mg by mouth daily.    . fish oil-omega-3 fatty acids 1000 MG capsule Take 1,200 mg by mouth daily.     Marland Kitchen lisinopril (PRINIVIL,ZESTRIL) 40 MG tablet TAKE 1 TABLET BY MOUTH  DAILY 90 tablet 1  . loratadine (CLARITIN) 10 MG tablet Take 10 mg by mouth daily.    . Melatonin 3 MG TABS Take 1 tablet by mouth at bedtime as needed.    . rosuvastatin (CRESTOR) 20 MG tablet TAKE 1 TABLET BY MOUTH  DAILY 90 tablet 1  . spironolactone (ALDACTONE) 25 MG tablet Take 0.5 tablets (12.5 mg total) by mouth daily. 90 tablet 3  . tamsulosin (FLOMAX) 0.4 MG CAPS Take 0.4 mg by mouth daily after supper.    . testosterone cypionate (DEPOTESTOTERONE CYPIONATE) 200 MG/ML injection Inject 400 mg into the muscle every 30 (thirty) days. Takes every three weeks.    . torsemide (DEMADEX) 10 MG tablet Take 5 mg by mouth daily.    Marland Kitchen zolpidem (AMBIEN) 5 MG tablet TAKE 1 TABLET BY MOUTH EVERY NIGHT AT BEDTIME AS NEEDED FOR SLEEP 30 tablet 2   No current facility-administered medications for this visit.     Social History   Social History  . Marital status: Single    Spouse name: Collie Siad  . Number of children: 3  . Years of education: MD   Occupational History  . Retired    Social History Main Topics  . Smoking status: Former Research scientist (life sciences)  . Smokeless tobacco: Never Used  . Alcohol use No  . Drug use: No  . Sexual  activity: Not on file   Other Topics Concern  . Not on file   Social History Narrative   Lives at home w/ his wife   Patient is right handed.   Patient drinks 2 cups caffeine daily.       Family History  Problem Relation Age of Onset  . Alzheimer's disease Mother 33  . Emphysema Father    Socially he is married and has 3 children, 14 grandchildren and 3 great-grandchildren. There is no tobacco or alcohol use.  ROS General: Negative; No fevers, chills, or night sweats;  HEENT: Positive for 2 transient episodes over the past year with left lateral eye field defect, sinus congestion, difficulty swallowing Pulmonary: Negative; No cough, wheezing, shortness of breath, hemoptysis Cardiovascular: See history of present illness GI: Negative; No nausea, vomiting, diarrhea, or abdominal pain GU: Negative; No dysuria, hematuria, or difficulty voiding Musculoskeletal: Positive for history of gout Hematologic/Oncology: Negative; no easy bruising, bleeding Endocrine: Negative; no heat/cold intolerance; no diabetes Neuro: Positive for h/o amaurosis fugax; resolved since Plavix was instituted Skin: Negative; No rashes or skin lesions Psychiatric: Negative; No behavioral problems, depression Sleep: Positive for sleep apnea.  With BiPAP therapy he is unaware of any breakthrough snoring, daytime sleepiness, hypersomnolence, bruxism, restless legs, hypnogognic hallucinations, no cataplexy Other comprehensive 14 point system review is negative.   PE BP 114/70   Pulse (!) 59   Ht 6' (1.829 m)   Wt 199 lb 6.4 oz (90.4 kg)   BMI 27.04 kg/m    Repeat blood pressure by me was 120/70 supine and 116/70 standing  Wt Readings from Last 3 Encounters:  05/29/17 199 lb 6.4 oz (90.4 kg)  12/11/16 202 lb (91.6 kg)  06/07/16 200 lb (90.7 kg)   General: Alert, oriented, no distress.  Skin: normal turgor, no rashes, warm and dry HEENT: Normocephalic, atraumatic. Pupils  equal round and reactive to  light; sclera anicteric; extraocular muscles intact;  Nose without nasal septal hypertrophy Mouth/Parynx benign;  Mallinpatti scale 3 Neck: No JVD, no carotid bruits; normal carotid upstroke Lungs: clear to ausculatation and percussion; no wheezing or rales Chest wall: without tenderness to palpitation Heart: PMI not displaced, RRR, s1 s2 normal, 1/6 systolic murmur , faint diastolic murmur, no rubs, gallops, thrills, or heaves Abdomen: soft, nontender; no hepatosplenomehaly, BS+; abdominal aorta nontender and not dilated by palpation. Back: no CVA tenderness Pulses 2+ Musculoskeletal: full range of motion, normal strength, no joint deformities Extremities: no clubbing cyanosis or edema, Homan's sign negative  Neurologic: grossly nonfocal; Cranial nerves grossly wnl Psychologic: Normal mood and affect   ECG (independently read by me): Sinus bradycardia 59 bpm.  Borderline first degree AV block with a PR interval 202 ms; QTC normal at 394 ms.  No ectopy.  April 2018 ECG (independently read by me): Normal sinus rhythm at 60, normal intervals.  No ST segment changes  October 2017 ECG (independently read by me): Sinus bradycardia at 57 bpm.  PR interval 200 ms.  QTc interval 395 ms.  July 2017 ECG (independently read by me): Normal sinus rhythm at 61 bpm.  Normal QTc with borderline first-degree AV block  November 2016 ECG (independently read by me): Normal sinus rhythm at 60 bpm.  PR interval 200 ms.  June 2016 ECG (independently read by me): Sinus rhythm at 63.  Intervals normal.  No ST segment changes  December 2015 ECG (independently read by me): Sinus bradycardia 59 bpm.  Mild first-degree AV block with a PR interval 212 ms.  QTc interval normal.  No ST segment changes.  June 2015 ECG (independently read by me): sinus rhythm at 59 beats per minute.  Borderline first degree AV block with a PR interval of 204 ms.  ECG: Sinus rhythm at 61 beats per minute. Borderline first degree AV  block with PR interval 200 ms. QT interval normal 388 ms.  LABS: BMP Latest Ref Rng & Units 12/18/2016 03/20/2016 07/19/2015  Glucose 65 - 99 mg/dL 95 97 91  BUN 7 - 25 mg/dL 12 20 26(H)  Creatinine 0.70 - 1.18 mg/dL 1.05 1.22(H) 1.19(H)  Sodium 135 - 146 mmol/L 142 143 142  Potassium 3.5 - 5.3 mmol/L 4.1 4.2 4.4  Chloride 98 - 110 mmol/L 110 104 103  CO2 20 - 31 mmol/L '25 31 30  ' Calcium 8.6 - 10.3 mg/dL 8.5(L) 9.0 9.1   Hepatic Function Latest Ref Rng & Units 12/18/2016 03/20/2016 07/19/2015  Total Protein 6.1 - 8.1 g/dL 6.1 6.8 6.8  Albumin 3.6 - 5.1 g/dL 3.6 3.9 3.8  AST 10 - 35 U/L '15 19 30  ' ALT 9 - 46 U/L 14 17 38  Alk Phosphatase 40 - 115 U/L 46 56 61  Total Bilirubin 0.2 - 1.2 mg/dL 0.8 0.6 1.0   CBC Latest Ref Rng & Units 12/18/2016 03/20/2016 07/19/2015  WBC 3.8 - 10.8 K/uL 6.2 6.3 6.0  Hemoglobin 13.2 - 17.1 g/dL 13.0(L) 14.9 14.6  Hematocrit 38.5 - 50.0 % 39.5 44.6 42.3  Platelets 140 - 400 K/uL 149 155 150   Lab Results  Component Value Date   MCV 96.8 12/18/2016   MCV 95.1 03/20/2016   MCV 93.6 07/19/2015   Lab Results  Component Value Date   TSH 1.76 12/18/2016  No results found for: HGBA1C   Lipid Panel      Component Value Date/Time   CHOL 83 12/18/2016  0803   TRIG 93 12/18/2016 0803   HDL 28 (L) 12/18/2016 0803   CHOLHDL 3.0 12/18/2016 0803   VLDL 19 12/18/2016 0803   LDLCALC 36 12/18/2016 0803     RADIOLOGY: No results found.  IMPRESSION:  1. Essential hypertension   2. Exertional shortness of breath   3. Grade I diastolic dysfunction   4. Pure hypercholesterolemia   5. OSA (obstructive sleep apnea) on BIPAP     ASSESSMENT AND PLAN: Kenneth [name] is a 81 year old gentleman who has a history of hypertension, hyperlipidemia, intermittent peripheral edema, obstructive sleep apnea, currently on BiPAP, as well as remote history of gout. He has documented bilateral renal cysts and is status post TURP.He has an increased postvoid residual and straight  caths himself one time every day.  He continues to Dr. [name] for urologic follow-up, and just saw him last week.  Today, his blood pressure is controlled now on a regimen consisting of amlodipine 5 mg, Bystolic 20 mg, lisinopril 40 mg, spironolactone 12.5 mg, and torsemide 10 mg.  He denies any lower extremity edema.  He continues to experience mild shortness of breath with activity which may be contributed by his diastolic dysfunction.  If his blood pressure can tolerate it may be possible in the future to try increasing spironolactone back to 12.5 g twice a day, but I will not do this presently.Marland Kitchen His last nuclear perfusion study in 2017 showed normal perfusion.  An echocardiographic evaluation hee had normal systolic function with grade 1 diastolic function,  mild AR, trivial TR and PR.  Pulmonary pressures are normal.  He continues to be on simvastatin 20 mg for hyperlipidemia.  I reviewed his recent laboratory from May 2018.  LDL was excellent at 36.  He is on Plavix, which was started by Dr. Jannifer Franklin after development of amaurosis fugax and he has not had recurrence since changing from aspirin to Plavix therapy.  He continues to use BiPAP with 100% compliance.  He denies daytime sleepiness or breakthrough snoring.  He will be moving into independent living at [facility].  Over the next several months.  He wishes that I see him at six-month intervals.  I will see him in 6 months for reevaluation. Time spent: 25 minutes  Troy Sine, MD, [facility]  05/29/2017 6:56 PM

## 2017-06-08 ENCOUNTER — Encounter: Payer: Self-pay | Admitting: Cardiovascular Disease

## 2017-06-24 ENCOUNTER — Encounter: Payer: Self-pay | Admitting: Cardiovascular Disease

## 2017-06-27 ENCOUNTER — Encounter: Payer: Self-pay | Admitting: *Deleted

## 2017-07-09 ENCOUNTER — Telehealth: Payer: Self-pay | Admitting: Cardiovascular Disease

## 2017-07-09 NOTE — Telephone Encounter (Signed)
Please fax face to face notes prior to the Sleep Study which she had on 12-18-2011 please. Please fax to [phone].

## 2017-07-18 NOTE — Telephone Encounter (Signed)
Requested original OV note from [facility].  If unable to get note-will need to order paper chart

## 2017-07-25 ENCOUNTER — Telehealth: Payer: Self-pay | Admitting: Cardiovascular Disease

## 2017-07-25 NOTE — Telephone Encounter (Signed)
Paper chart has been ordered via medical records.

## 2017-07-25 NOTE — Telephone Encounter (Signed)
New Message  Pt call requesting to speak with RN to get an appt with only Dr. Tresa EndoKelly before the end of January due to insurance cover. Please call back to discuss

## 2017-07-25 NOTE — Telephone Encounter (Signed)
Spoke with patient and he received his new CPAP machine end of November. Per patient for insurance purposes and coverage he needs to be seen by Dr Tresa EndoKelly within 60-90 days of receiving machine. Will forward to [facility] RN with Dr Tresa EndoKelly.

## 2017-07-27 ENCOUNTER — Encounter: Payer: Self-pay | Admitting: Cardiovascular Disease

## 2017-07-27 NOTE — Telephone Encounter (Signed)
Patient is scheduled for 1/30 sleep clinic at 8:20 am

## 2017-08-21 ENCOUNTER — Telehealth: Payer: Self-pay | Admitting: *Deleted

## 2017-08-21 NOTE — Telephone Encounter (Signed)
Order form signed by Dr. Tresa EndoKelly and returned to [facility].

## 2017-09-11 ENCOUNTER — Other Ambulatory Visit: Payer: Self-pay | Admitting: Cardiovascular Disease

## 2017-09-12 ENCOUNTER — Ambulatory Visit: Payer: Medicare Other | Admitting: Cardiovascular Disease

## 2017-09-12 ENCOUNTER — Encounter: Payer: Self-pay | Admitting: Cardiovascular Disease

## 2017-09-12 ENCOUNTER — Ambulatory Visit (INDEPENDENT_AMBULATORY_CARE_PROVIDER_SITE_OTHER): Payer: Medicare Other | Admitting: Cardiovascular Disease

## 2017-09-12 VITALS — BP 104/58 | HR 59 | Ht 72.0 in | Wt 199.2 lb

## 2017-09-12 DIAGNOSIS — I10 Essential (primary) hypertension: Secondary | ICD-10-CM | POA: Diagnosis not present

## 2017-09-12 DIAGNOSIS — R002 Palpitations: Secondary | ICD-10-CM | POA: Diagnosis not present

## 2017-09-12 DIAGNOSIS — G4733 Obstructive sleep apnea (adult) (pediatric): Secondary | ICD-10-CM

## 2017-09-12 DIAGNOSIS — I5189 Other ill-defined heart diseases: Secondary | ICD-10-CM

## 2017-09-12 DIAGNOSIS — E7800 Pure hypercholesterolemia, unspecified: Secondary | ICD-10-CM

## 2017-09-12 DIAGNOSIS — I519 Heart disease, unspecified: Secondary | ICD-10-CM | POA: Diagnosis not present

## 2017-09-12 MED ORDER — DILTIAZEM HCL ER COATED BEADS 120 MG PO CP24: 120.0000 mg | ORAL_CAPSULE | Freq: Every day | ORAL | 3 refills | Status: DC

## 2017-09-12 MED ORDER — METOPROLOL TARTRATE 25 MG PO TABS: ORAL_TABLET | ORAL | 3 refills | Status: DC

## 2017-09-12 NOTE — Progress Notes (Signed)
Patient ID: Kenneth [name], male   DOB: 08/17/1935, 81 y.o.   MRN: [id number]     PCP: Dr. Larry Holler at [facility] in [place]  HPI: Kenneth [name] is a 81 y.o. male who presents for a 3 month f/u sleep/cardiology evaluation.  Kenneth [name] has a history of hypertension, hyperlipidemia, gout, and is status post TURP surgery by Dr. Evans.  He has a residual underactive bladder and as result straight caths himself one time every day.  He states his post void residuals are in the 250-300 cc range.  He also has  bilateral renal cysts. He has a history of documented moderate obstructive sleep apnea with an AHI of 22.7 and an RDI of 33 and has been on BiPAP Auto therapy with an EPAP minimum of 10, IPAP maximum potential up to 25 with a delta of for which he started in July 2012. 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  Recently, he has had issues with very dry mouth.  He uses a nasal mask.  He has had intermittent use of a chin strap.    An echo Doppler study in 05/2013 revealed normal left ventricle wall thickness with normal systolic and diastolic function. He did have mild aortic regurgitation due to poor central leaflet coaptation. There was borderline prolapse of his anterior mitral valve leaflet with trivial mitral regurgitation. He had normal pulmonary pressures assessment artery systolic pressure 19 mm.  He has had issues in the past with peripheral edema, which had led to an increased dose of his torsemide last year.  He denies exertionally precipitated chest pain.  He did experience one episode of some chest fullness or night when he was sitting on his bed, but this ultimately resolved, and he has not had any further sensation He is unaware of palpitations.  He denies presyncope or syncope.  In December 2015, he complained of experiencing transient episodes of a field defect in the lateral left eye.  These were both self-limiting.  He saw an ophthalmologist  who recommended that he notify me concerning the symptoms  His history is notable in that he did sustain to prior head injuries many years ago.  Once when he was in high school and had a severe concussion.  Another time occurred when he fell out of a tree stand when he was hunting.  I subtotal referred him to [facility].  Due to my concern for possible amaurosis fugax.  He saw Kenneth [name], who felt that the patient had experienced at least 3 episodes of amaurosis fugax in his left eye.  He underwent an MRI of the brain that showed mild small vessel ischemic changes in the MRA of the head and carotid Doppler studies which were unremarkable.  He was taken off aspirin and started on Plavix 75 mg.  He denies any recurrent symptomatology.  His primary physician is Dr. Holler in [place].   He has a history of hyperlipidemia and is tolerating Crestor 20 mg.  He has been on amlodipine 5 mg, Bystolic 20 mg, and lisinopril 40 mg for hypertension.  He was found to have increasing PSA levels with his levels rising from 1.5  to 7 and saw Dr. Evans for biopsy. He underwent cholecystectomy and at that time his ECG showed a PR interval at 212 ms, which was of concern by the anesthesiologist.  He tolerated surgery well without cardiovascular compromise.   In 2017 he had noticed a change in development   of shortness of breath with less activity.  He denied associated chest tightness and was experiencing shortness of breath with walking less than 100 yards.  This has been ongoing for several months.  He had noticed some trace swelling in his ankles.  A nuclear stress test which was done on 04/11/2016.  Ejection fraction was 59%.  There were no ECG changes.  There was normal perfusion.  An echo Doppler study on 04/12/2016 showed mild LVH with normal systolic function with very mild grade 1 diastolic dysfunction.  There was mild aortic insufficiency and trivial TR and PR.  Pulmonary pressures were  normal.  From a sleep perspective, he continues to use his BiPAP therapy.  He admits to his mouth getting dry during the day.  He has a nasal mask.  He may be having oral venting.  In the past.  He was given a chin strap but has not been consistently using this.  He has not had any recent download.  He believes his sleep initiation is good but he is having difficulty with sleep maintenance. He uses [employer] for his DME company.    He had experienced swelling in his lower extremity and a lower extremity Doppler study was negative for DVT in [place].  He was able to walk a mile without significant abnormalities, but when he has to carry his garbage can to the curb he notes shortness of breath.    Since his  office visit in April 2018, he has continued to be without chest pain.  There is no change in his previous shortness of breath.  I had attempted to initiate spironolactone with his diastolic dysfunction but apparently during my absence heat, cold, the office and was concerned that he had not noticed any significant change.  He was advised to go back and take one half of his previous dose of torsemide and one half of spironolactone.  He has been doing this and actually he has felt somewhat better.  He continues to take lisinopril 40 mg, Bystolic 20 mg, and amlodipine for hypertension.  He denies significant swelling.  He is on Crestor for hyperlipidemia.   Since I last saw him in October 2018, he had received a new BiPAP machine, ResMed air curve 10 feet auto.  His set up date was 07/06/2017.  His minimum EPAP pressure is 9 with a maximum IPAP pressure of 25.  In the office today.  I obtained a download from 08/12/2017 through 09/10/2017.  He was 100% compliant with usage stays and usage greater than 4 hours was 97%.  He is averaging almost 6 hours and 30 minutes of sleep per night.  AHI is excellent at 1.2 and his 95th percentile pressure was 14.7/10.8 with a maximum average pressure of 15.8/11.8.  He  has noticed significant improvement with his new machine.  He is sleeping well.  Recently, he has noticed occasional palpitations which he describes as a chest flutter, which may last up to 30 seconds.  He has noticed this rarely every 2-3 months and typically there is an abrupt onset and abrupt cessation. Marland Kitchen  He denies presyncope or syncope.  He denies associated chest tightness.  He presents for evaluation.  Past Medical History:  Diagnosis Date  . Amaurosis fugax 09/22/2014  . Diastolic dysfunction    grade 1 by echo 4/11  . Dyslipidemia   . HTN (hypertension)   . Sleep apnea    on BiPap    Past Surgical History:  Procedure Laterality Date  .  CATARACT EXTRACTION Bilateral   . CHOLECYSTECTOMY    . SEPTOPLASTY  2000  . TONSILLECTOMY    . TRANSURETHRAL RESECTION OF PROSTATE  1997    No Known Allergies  Current Outpatient Medications  Medication Sig Dispense Refill  . allopurinol (ZYLOPRIM) 100 MG tablet Take 200 mg by mouth daily.     Marland Kitchen BYSTOLIC 20 MG TABS TAKE 1 TABLET BY MOUTH  DAILY 90 tablet 2  . Cholecalciferol (VITAMIN D-1000 MAX ST) 1000 UNITS tablet Take 1,000 Units by mouth daily.    . clopidogrel (PLAVIX) 75 MG tablet Take 1 tablet (75 mg total) by mouth daily. 90 tablet 2  . diclofenac sodium (VOLTAREN) 1 % GEL Apply 1 application topically as needed.    . ferrous sulfate 325 (65 FE) MG EC tablet Take 325 mg by mouth daily.    . fish oil-omega-3 fatty acids 1000 MG capsule Take 1,200 mg by mouth daily.     Marland Kitchen lisinopril (PRINIVIL,ZESTRIL) 40 MG tablet TAKE 1 TABLET BY MOUTH  DAILY 90 tablet 1  . loratadine (CLARITIN) 10 MG tablet Take 10 mg by mouth daily.    . Melatonin 3 MG TABS Take 1 tablet by mouth at bedtime as needed.    . rosuvastatin (CRESTOR) 20 MG tablet TAKE 1 TABLET BY MOUTH  DAILY 90 tablet 1  . spironolactone (ALDACTONE) 25 MG tablet Take 12.5 mg by mouth daily.    . tamsulosin (FLOMAX) 0.4 MG CAPS Take 0.4 mg by mouth daily after supper.    .  testosterone cypionate (DEPOTESTOSTERONE CYPIONATE) 200 MG/ML injection Inject 400 mg into the muscle every 30 (thirty) days.    Marland Kitchen torsemide (DEMADEX) 10 MG tablet Take 5 mg by mouth daily.    Marland Kitchen zolpidem (AMBIEN) 5 MG tablet TAKE 1 TABLET BY MOUTH EVERY NIGHT AT BEDTIME AS NEEDED FOR SLEEP 30 tablet 2  . diltiazem (CARDIZEM CD) 120 MG 24 hr capsule Take 1 capsule (120 mg total) by mouth daily. 90 capsule 3  . metoprolol tartrate (LOPRESSOR) 25 MG tablet Take 12.46m-25mg AS NEEDED for palpitations 30 tablet 3   No current facility-administered medications for this visit.     Social History   Socioeconomic History  . Marital status: Single    Spouse name: Kenneth [name] . Number of children: 3  . Years of education: MD  . Highest education level: Not on file  Social Needs  . Financial resource strain: Not on file  . Food insecurity - worry: Not on file  . Food insecurity - inability: Not on file  . Transportation needs - medical: Not on file  . Transportation needs - non-medical: Not on file  Occupational History  . Occupation: Retired  Tobacco Use  . Smoking status: Former SResearch scientist (life sciences) . Smokeless tobacco: Never Used  Substance and Sexual Activity  . Alcohol use: No  . Drug use: No  . Sexual activity: Not on file  Other Topics Concern  . Not on file  Social History Narrative   Lives at home w/ his wife   Patient is right handed.   Patient drinks 2 cups caffeine daily.    Family History  Problem Relation Age of Onset  . Alzheimer's disease Mother 872 . Emphysema Father    Socially he is married and has 3 children, 14 grandchildren and 3 great-grandchildren. There is no tobacco or alcohol use.  ROS General: Negative; No fevers, chills, or night sweats;  HEENT: Positive for 2 transient episodes over the past  year with left lateral eye field defect, sinus congestion, difficulty swallowing Pulmonary: Negative; No cough, wheezing, shortness of breath, hemoptysis Cardiovascular: See  history of present illness GI: Negative; No nausea, vomiting, diarrhea, or abdominal pain GU: Negative; No dysuria, hematuria, or difficulty voiding Musculoskeletal: Positive for history of gout Hematologic/Oncology: Negative; no easy bruising, bleeding Endocrine: Negative; no heat/cold intolerance; no diabetes Neuro: Positive for h/o amaurosis fugax; resolved since Plavix was instituted Skin: Negative; No rashes or skin lesions Psychiatric: Negative; No behavioral problems, depression Sleep: Positive for sleep apnea.  With BiPAP therapy he is unaware of any breakthrough snoring, daytime sleepiness, hypersomnolence, bruxism, restless legs, hypnogognic hallucinations, no cataplexy Other comprehensive 14 point system review is negative.   PE BP (!) 104/58   Pulse (!) 59   Ht 6' (1.829 m)   Wt 199 lb 3.2 oz (90.4 kg)   SpO2 97%   BMI 27.02 kg/m    Repeat blood pressure by me was 122/64.  There was no orthostatic change.  Wt Readings from Last 3 Encounters:  09/12/17 199 lb 3.2 oz (90.4 kg)  05/29/17 199 lb 6.4 oz (90.4 kg)  12/11/16 202 lb (91.6 kg)     Physical Exam BP (!) 104/58   Pulse (!) 59   Ht 6' (1.829 m)   Wt 199 lb 3.2 oz (90.4 kg)   SpO2 97%   BMI 27.02 kg/m  General: Alert, oriented, no distress.  Skin: normal turgor, no rashes, warm and dry HEENT: Normocephalic, atraumatic. Pupils equal round and reactive to light; sclera anicteric; extraocular muscles intact;  Nose without nasal septal hypertrophy Mouth/Parynx benign; Mallinpatti scale 3 Neck: No JVD, no carotid bruits; normal carotid upstroke Lungs: clear to ausculatation and percussion; no wheezing or rales Chest wall: without tenderness to palpitation Heart: PMI not displaced, RRR, s1 s2 normal, 1/6 systolic murmur, no diastolic murmur, no rubs, gallops, thrills, or heaves Abdomen: mild-to-moderate abdominal diastases recti; soft, nontender; no hepatosplenomehaly, BS+; abdominal aorta nontender and not  dilated by palpation. Back: no CVA tenderness Pulses 2+ Musculoskeletal: full range of motion, normal strength, no joint deformities Extremities: no clubbing cyanosis or edema, Homan's sign negative  Neurologic: grossly nonfocal; Cranial nerves grossly wnl Psychologic: Normal mood and affect   ECG (independently read by me): Sinus bradycardia 59 bpm.  Borderline first degree AV block with a PR interval 202 ms; QTC normal at 394 ms.  No ectopy.  April 2018 ECG (independently read by me): Normal sinus rhythm at 60, normal intervals.  No ST segment changes  October 2017 ECG (independently read by me): Sinus bradycardia at 57 bpm.  PR interval 200 ms.  QTc interval 395 ms.  July 2017 ECG (independently read by me): Normal sinus rhythm at 61 bpm.  Normal QTc with borderline first-degree AV block  November 2016 ECG (independently read by me): Normal sinus rhythm at 60 bpm.  PR interval 200 ms.  June 2016 ECG (independently read by me): Sinus rhythm at 63.  Intervals normal.  No ST segment changes  December 2015 ECG (independently read by me): Sinus bradycardia 59 bpm.  Mild first-degree AV block with a PR interval 212 ms.  QTc interval normal.  No ST segment changes.  June 2015 ECG (independently read by me): sinus rhythm at 59 beats per minute.  Borderline first degree AV block with a PR interval of 204 ms.  ECG: Sinus rhythm at 61 beats per minute. Borderline first degree AV block with PR interval 200 ms. QT interval normal 388  ms.  LABS: BMP Latest Ref Rng & Units 12/18/2016 03/20/2016 07/19/2015  Glucose 65 - 99 mg/dL 95 97 91  BUN 7 - 25 mg/dL 12 20 26(H)  Creatinine 0.70 - 1.18 mg/dL 1.05 1.22(H) 1.19(H)  Sodium 135 - 146 mmol/L 142 143 142  Potassium 3.5 - 5.3 mmol/L 4.1 4.2 4.4  Chloride 98 - 110 mmol/L 110 104 103  CO2 20 - 31 mmol/L 25 31 30  Calcium 8.6 - 10.3 mg/dL 8.5(L) 9.0 9.1   Hepatic Function Latest Ref Rng & Units 12/18/2016 03/20/2016 07/19/2015  Total Protein 6.1 - 8.1  g/dL 6.1 6.8 6.8  Albumin 3.6 - 5.1 g/dL 3.6 3.9 3.8  AST 10 - 35 U/L 15 19 30  ALT 9 - 46 U/L 14 17 38  Alk Phosphatase 40 - 115 U/L 46 56 61  Total Bilirubin 0.2 - 1.2 mg/dL 0.8 0.6 1.0   CBC Latest Ref Rng & Units 12/18/2016 03/20/2016 07/19/2015  WBC 3.8 - 10.8 K/uL 6.2 6.3 6.0  Hemoglobin 13.2 - 17.1 g/dL 13.0(L) 14.9 14.6  Hematocrit 38.5 - 50.0 % 39.5 44.6 42.3  Platelets 140 - 400 K/uL 149 155 150   Lab Results  Component Value Date   MCV 96.8 12/18/2016   MCV 95.1 03/20/2016   MCV 93.6 07/19/2015   Lab Results  Component Value Date   TSH 1.76 12/18/2016  No results found for: HGBA1C   Lipid Panel      Component Value Date/Time   CHOL 83 12/18/2016 0803   TRIG 93 12/18/2016 0803   HDL 28 (L) 12/18/2016 0803   CHOLHDL 3.0 12/18/2016 0803   VLDL 19 12/18/2016 0803   LDLCALC 36 12/18/2016 0803     RADIOLOGY: No results found.  IMPRESSION:  1. Essential hypertension   2. OSA (obstructive sleep apnea)   3. Palpitations   4. Pure hypercholesterolemia   5. Grade I diastolic dysfunction     ASSESSMENT AND PLAN: Kenneth [name] is a 81-year-old gentleman who has a history of hypertension, hyperlipidemia, intermittent peripheral edema, obstructive sleep apnea, currently on BiPAP, as well as remote history of gout. He has documented bilateral renal cysts and is status post TURP.He has an increased postvoid residual and straight caths himself one time every day.  He continues to Dr. Evans for urologic follow-up.  Since I last saw him, he received a new ResMed air curve 10 the auto BiPAPmachine.  A download today reveals excellent compliance.  He denies daytime sleepiness.  AHI is excellent at 1.2.  He is unaware of breakthrough snoring.  His blood pressure today is stable on a regimen now consisting of amlodipine 5 mg, Bystolic 20 mg, lisinopril 40 mg, spironolactone 12.5 mg,torsemide, which he takes 5 mg.  He does not have any significant edema.  He recently has noticed  some palpitations which can occur every 2-3 months and typically last for less than 30 seconds.  However, on one occasion.  He did take his pulse is carotid and he felt his heart rate had increased and was over 100.  For this reason, I am recommending discontinuance of amlodipine change this to Cardizem CD 120 mg at night.  I'm also giving him a prescription for metoprolol, tartrate to take 12.5-25 mg as needed.  If recurrent tachypalpitations developed.  We discussed improved sleep duration of 7-8 hours if at all possible. He continues to be on rosuvastatin 20 mg for hyperlipidemia and is tolerating this well.  He is followed by Dr.   Evans for urology and is on Flomax in addition totestosterone.  I will see him in 4 months for reevaluation  Time spent: 25 minutes  Thomas A. Kelly, MD, FACC  09/13/2017 7:41 AM    

## 2017-09-12 NOTE — Patient Instructions (Signed)
Medication Instructions:  STOP amlodipine  START Cardizem CD 120 mg daily at bedtime  Take metoprolol tartrate (Lopressor) 12.5mg -25mg  AS NEEDED for palpitations/tachycardia  Follow-Up: Your physician recommends that you schedule a follow-up appointment in: 4 months with Dr. Tresa EndoKelly.   Any Other Special Instructions Will Be Listed Below (If Applicable).     If you need a refill on your cardiac medications before your next appointment, please call your pharmacy.

## 2017-09-13 ENCOUNTER — Encounter: Payer: Self-pay | Admitting: Cardiovascular Disease

## 2017-09-14 ENCOUNTER — Other Ambulatory Visit: Payer: Self-pay | Admitting: *Deleted

## 2017-09-30 ENCOUNTER — Other Ambulatory Visit: Payer: Self-pay | Admitting: Cardiovascular Disease

## 2017-10-01 ENCOUNTER — Other Ambulatory Visit: Payer: Self-pay

## 2017-10-01 MED ORDER — ZOLPIDEM TARTRATE 5 MG PO TABS: 5.0000 mg | ORAL_TABLET | Freq: Every evening | ORAL | 2 refills | Status: DC | PRN

## 2017-12-15 ENCOUNTER — Other Ambulatory Visit: Payer: Self-pay | Admitting: Cardiovascular Disease

## 2017-12-17 ENCOUNTER — Other Ambulatory Visit: Payer: Self-pay | Admitting: Cardiovascular Disease

## 2017-12-17 NOTE — Telephone Encounter (Signed)
REFILL 

## 2017-12-18 NOTE — Telephone Encounter (Signed)
REFILL 

## 2017-12-19 ENCOUNTER — Other Ambulatory Visit: Payer: Self-pay

## 2017-12-19 MED ORDER — SPIRONOLACTONE 25 MG PO TABS: 12.5000 mg | ORAL_TABLET | Freq: Every day | ORAL | 1 refills | Status: DC

## 2017-12-26 ENCOUNTER — Other Ambulatory Visit: Payer: Self-pay | Admitting: *Deleted

## 2017-12-26 MED ORDER — ZOLPIDEM TARTRATE 5 MG PO TABS: 5.0000 mg | ORAL_TABLET | Freq: Every evening | ORAL | 2 refills | Status: DC | PRN

## 2018-01-17 IMAGING — NM NM MISC PROCEDURE
6 series · 36 of 36 positions shown · non-contrast
Comparison: none

[Series 1: wbr rest · 6.40mm/px · 6 of 64 frames shown]
[frame 6/64]
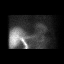
[frame 16/64]
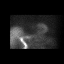
[frame 27/64]
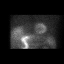
[frame 38/64]
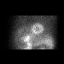
[frame 48/64]
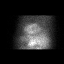
[frame 59/64]
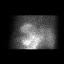

[Series 1: wbr_r-proj_st wbr rest · 6.40mm/px · 6 of 64 frames shown]
[frame 6/64]
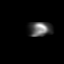
[frame 16/64]
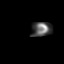
[frame 27/64]
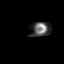
[frame 38/64]
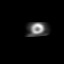
[frame 48/64]
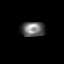
[frame 59/64]
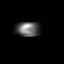

[Series 2: wbr stress-gsp · 6.40mm/px · 6 of 512 frames shown]
[frame 43/512]
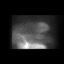
[frame 128/512]
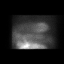
[frame 214/512]
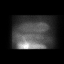
[frame 299/512]
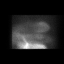
[frame 384/512]
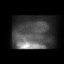
[frame 470/512]
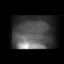

[Series 2: wbr_s-proj_st wbr stress-gsp · 6.40mm/px · 6 of 512 frames shown]
[frame 43/512]
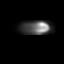
[frame 128/512]
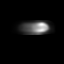
[frame 214/512]
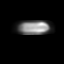
[frame 299/512]
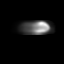
[frame 384/512]
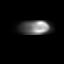
[frame 470/512]
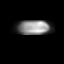

[Series 3: wbr_s-proj_st wbr stress-sum-em · 6.40mm/px · 6 of 64 frames shown]
[frame 6/64]
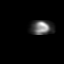
[frame 16/64]
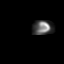
[frame 27/64]
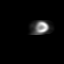
[frame 38/64]
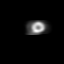
[frame 48/64]
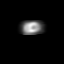
[frame 59/64]
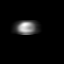

[Series 3: wbr stress-sum-em · 6.40mm/px · 6 of 64 frames shown]
[frame 6/64]
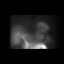
[frame 16/64]
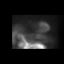
[frame 27/64]
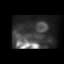
[frame 38/64]
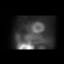
[frame 48/64]
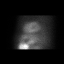
[frame 59/64]
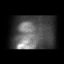

[36 of 36 positions shown; findings below may reference images not displayed]

Canned report from images found in remote index.

Refer to host system for actual result text.

## 2018-01-19 ENCOUNTER — Other Ambulatory Visit: Payer: Self-pay | Admitting: Cardiovascular Disease

## 2018-01-21 ENCOUNTER — Ambulatory Visit (INDEPENDENT_AMBULATORY_CARE_PROVIDER_SITE_OTHER): Payer: Medicare Other | Admitting: Cardiovascular Disease

## 2018-01-21 VITALS — BP 112/56 | HR 50 | Ht 72.0 in | Wt 199.0 lb

## 2018-01-21 DIAGNOSIS — I10 Essential (primary) hypertension: Secondary | ICD-10-CM

## 2018-01-21 DIAGNOSIS — R0602 Shortness of breath: Secondary | ICD-10-CM

## 2018-01-21 DIAGNOSIS — I519 Heart disease, unspecified: Secondary | ICD-10-CM | POA: Diagnosis not present

## 2018-01-21 DIAGNOSIS — E7800 Pure hypercholesterolemia, unspecified: Secondary | ICD-10-CM

## 2018-01-21 DIAGNOSIS — G4733 Obstructive sleep apnea (adult) (pediatric): Secondary | ICD-10-CM | POA: Diagnosis not present

## 2018-01-21 DIAGNOSIS — I5189 Other ill-defined heart diseases: Secondary | ICD-10-CM

## 2018-01-21 MED ORDER — NEBIVOLOL HCL 20 MG PO TABS: 0.5000 | ORAL_TABLET | Freq: Every day | ORAL | 3 refills | Status: DC

## 2018-01-21 NOTE — Patient Instructions (Signed)
Medication Instructions:  DECREASE Bystolic to 10 mg daily  Testing/Procedures: Your physician has requested that you have an echocardiogram in [place]. Echocardiography is a painless test that uses sound waves to create images of your heart. It provides your doctor with information about the size and shape of your heart and how well your heart's chambers and valves are working. This procedure takes approximately one hour. There are no restrictions for this procedure.  Follow-Up: Your physician wants you to follow-up in: October with Dr. Tresa EndoKelly.  You will receive a reminder letter in the mail two months in advance. If you don't receive a letter, please call our office to schedule the follow-up appointment.   Any Other Special Instructions Will Be Listed Below (If Applicable).     If you need a refill on your cardiac medications before your next appointment, please call your pharmacy.

## 2018-01-21 NOTE — Progress Notes (Signed)
Patient ID: Zafir Schauer, male   DOB: 02-Nov-1935, 82 y.o.   MRN: [id number]     PCP: [name] at [facility] in [place]  HPI: Resean Brander is a 82 y.o. male who presents for a 5 month f/u sleep/cardiology evaluation.  Mr. Petrasek has a history of hypertension, hyperlipidemia, gout, and is status post TURP surgery by Dr. [name].  He has a residual underactive bladder and as result straight caths himself one time every day.  He states his post void residuals are in the 250-300 cc range.  He also has  bilateral renal cysts. He has a history of documented moderate obstructive sleep apnea with an AHI of 22.7 and an RDI of 33 and has been on BiPAP Auto therapy with an EPAP minimum of 10, IPAP maximum potential up to 25 with a delta of for which he started in July 2012. 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  Recently, he has had issues with very dry mouth.  He uses a nasal mask.  He has had intermittent use of a chin strap.    An echo Doppler study in 05/2013 revealed normal left ventricle wall thickness with normal systolic and diastolic function. He did have mild aortic regurgitation due to poor central leaflet coaptation. There was borderline prolapse of his anterior mitral valve leaflet with trivial mitral regurgitation. He had normal pulmonary pressures assessment artery systolic pressure 19 mm.  He has had issues in the past with peripheral edema, which had led to an increased dose of his torsemide last year.  He denies exertionally precipitated chest pain.  He did experience one episode of some chest fullness or night when he was sitting on his bed, but this ultimately resolved, and he has not had any further sensation He is unaware of palpitations.  He denies presyncope or syncope.  In December 2015, he complained of experiencing transient episodes of a field defect in the lateral left eye.  These were both self-limiting.  He saw an ophthalmologist  who recommended that he notify me concerning the symptoms  His history is notable in that he did sustain to prior head injuries many years ago.  Once when he was in high school and had a severe concussion.  Another time occurred when he fell out of a tree stand when he was hunting.  I subtotal referred him to [facility] neurology.  Due to my concern for possible amaurosis fugax.  He saw Dr. [name], who felt that the patient had experienced at least 3 episodes of amaurosis fugax in his left eye.  He underwent an MRI of the brain that showed mild small vessel ischemic changes in the MRA of the head and carotid Doppler studies which were unremarkable.  He was taken off aspirin and started on Plavix 75 mg.  He denies any recurrent symptomatology.  His primary physician is Dr. [name] in [place].   He has a history of hyperlipidemia and is tolerating Crestor 20 mg.  He has been on amlodipine 5 mg, Bystolic 20 mg, and lisinopril 40 mg for hypertension.  He was found to have increasing PSA levels with his levels rising from 1.5  to 7 and saw Dr. [name] for biopsy. He underwent cholecystectomy and at that time his ECG showed a PR interval at 212 ms, which was of concern by the anesthesiologist.  He tolerated surgery well without cardiovascular compromise.   In 2017 he had noticed a change in development  of shortness of breath with less activity.  He denied associated chest tightness and was experiencing shortness of breath with walking less than 100 yards.  This has been ongoing for several months.  He had noticed some trace swelling in his ankles.  A nuclear stress test which was done on 04/11/2016.  Ejection fraction was 59%.  There were no ECG changes.  There was normal perfusion.  An echo Doppler study on 04/12/2016 showed mild LVH with normal systolic function with very mild grade 1 diastolic dysfunction.  There was mild aortic insufficiency and trivial TR and PR.  Pulmonary pressures were  normal.  From a sleep perspective, he continues to use his BiPAP therapy.  He admits to his mouth getting dry during the day.  He has a nasal mask.  He may be having oral venting.  In the past.  He was given a chin strap but has not been consistently using this.  He has not had any recent download.  He believes his sleep initiation is good but he is having difficulty with sleep maintenance. He uses [employer] for his DME company.    He had experienced swelling in his lower extremity and a lower extremity Doppler study was negative for DVT in [place].  He was able to walk a mile without significant abnormalities, but when he has to carry his garbage can to the curb he notes shortness of breath.    Since his  office visit in April 2018, he has continued to be without chest pain.  There is no change in his previous shortness of breath.  I had attempted to initiate spironolactone with his diastolic dysfunction but apparently during my absence heat, cold, the office and was concerned that he had not noticed any significant change.  He was advised to go back and take one half of his previous dose of torsemide and one half of spironolactone.  He has been doing this and actually he has felt somewhat better.  He continues to take lisinopril 40 mg, Bystolic 20 mg, and amlodipine for hypertension.  He denies significant swelling.  He is on Crestor for hyperlipidemia.   Since I last saw him in October 2018, he had received a new BiPAP machine, ResMed air curve 10 feet auto.  His set up date was 07/06/2017.  His minimum EPAP pressure is 9 with a maximum IPAP pressure of 25.  In the office today.  I obtained a download from 08/12/2017 through 09/10/2017.  He was 100% compliant with usage stays and usage greater than 4 hours was 97%.  He is averaging almost 6 hours and 30 minutes of sleep per night.  AHI is excellent at 1.2 and his 95th percentile pressure was 14.7/10.8 with a maximum average pressure of 15.8/11.8.  He  has noticed significant improvement with his new machine.  He is sleeping well.  When I last saw him he had noticed rare palpitations which he describes as a chest flutter, which may last up to 30 seconds.  He has noticed this rarely every 2-3 months and typically there is an abrupt onset and abrupt cessation. Marland Kitchen  He denies presyncope or syncope.  He denies associated chest tightness.  At that office visit, I discontinued amlodipine and change this to Cardizem CD 120 mg at night and also gave a prescription from a told metoprolol tartrate to take 12.5 to 25 mg as needed.  I also discussed improved sleep duration at all possible.  As I last saw him, recent  palpitations with the added diltiazem to his regimen.  However his major complaint is that of fatigue well as exertional dyspnea.  He denies any leg swelling.  He denies chest pain.  Is continued to be on Bystolic 20 mg, diltiazem CD 120 daily, lisinopril 40 mg daily, in addition to low-dose spironolactone 12.5 mg and torsemide just 5 mg.  Continues to use BiPAP and a download from Dec 22, 2017 through January 20, 2018 shows 100% compliance.  However, sleep duration was only 5 hours and 45 minutes.  95th percentile pressure was 15/11 with a maximum pressure 16/12.  AHI is excellent at 1.0.  He presents for reevaluation.    Past Medical History:  Diagnosis Date  . Amaurosis fugax 09/22/2014  . Diastolic dysfunction    grade 1 by echo 4/11  . Dyslipidemia   . HTN (hypertension)   . Sleep apnea    on BiPap    Past Surgical History:  Procedure Laterality Date  . CATARACT EXTRACTION Bilateral   . CHOLECYSTECTOMY    . SEPTOPLASTY  2000  . TONSILLECTOMY    . TRANSURETHRAL RESECTION OF PROSTATE  1997    No Known Allergies  Current Outpatient Medications  Medication Sig Dispense Refill  . allopurinol (ZYLOPRIM) 100 MG tablet Take 200 mg by mouth daily.     . Cholecalciferol (VITAMIN D-1000 MAX ST) 1000 UNITS tablet Take 1,000 Units by mouth daily.     . clopidogrel (PLAVIX) 75 MG tablet Take 1 tablet (75 mg total) by mouth daily. 90 tablet 2  . diclofenac sodium (VOLTAREN) 1 % GEL Apply 1 application topically as needed.    . ferrous sulfate 325 (65 FE) MG EC tablet Take 325 mg by mouth daily.    . fish oil-omega-3 fatty acids 1000 MG capsule Take 1,200 mg by mouth daily.     Marland Kitchen lisinopril (PRINIVIL,ZESTRIL) 40 MG tablet TAKE 1 TABLET BY MOUTH  DAILY 90 tablet 1  . loratadine (CLARITIN) 10 MG tablet Take 10 mg by mouth daily.    . Melatonin 3 MG TABS Take 1 tablet by mouth at bedtime as needed.    . Nebivolol HCl (BYSTOLIC) 20 MG TABS Take 0.5 tablets (10 mg total) by mouth daily. 90 tablet 3  . rosuvastatin (CRESTOR) 20 MG tablet TAKE 1 TABLET BY MOUTH  DAILY 90 tablet 1  . spironolactone (ALDACTONE) 25 MG tablet Take 0.5 tablets (12.5 mg total) by mouth daily. 90 tablet 1  . tamsulosin (FLOMAX) 0.4 MG CAPS Take 0.4 mg by mouth daily after supper.    . testosterone cypionate (DEPOTESTOSTERONE CYPIONATE) 200 MG/ML injection Inject 400 mg into the muscle every 30 (thirty) days.    Marland Kitchen torsemide (DEMADEX) 10 MG tablet Take 5 mg by mouth daily.    Marland Kitchen zolpidem (AMBIEN) 5 MG tablet Take 1 tablet (5 mg total) by mouth at bedtime as needed. for sleep 30 tablet 2  . diltiazem (CARDIZEM CD) 120 MG 24 hr capsule Take 1 capsule (120 mg total) by mouth daily. 90 capsule 3  . metoprolol tartrate (LOPRESSOR) 25 MG tablet TAKE 1/2 TO 1 TABLET BY MOUTH AS NEEDED FOR PALPITATIONS 30 tablet 11   No current facility-administered medications for this visit.     Social History   Socioeconomic History  . Marital status: Single    Spouse name: Collie Siad  . Number of children: 3  . Years of education: MD  . Highest education level: Not on file  Occupational History  . Occupation: Retired  Social Needs  . Financial resource strain: Not on file  . Food insecurity:    Worry: Not on file    Inability: Not on file  . Transportation needs:    Medical: Not on file     Non-medical: Not on file  Tobacco Use  . Smoking status: Former Research scientist (life sciences)  . Smokeless tobacco: Never Used  Substance and Sexual Activity  . Alcohol use: No  . Drug use: No  . Sexual activity: Not on file  Lifestyle  . Physical activity:    Days per week: Not on file    Minutes per session: Not on file  . Stress: Not on file  Relationships  . Social connections:    Talks on phone: Not on file    Gets together: Not on file    Attends religious service: Not on file    Active member of club or organization: Not on file    Attends meetings of clubs or organizations: Not on file    Relationship status: Not on file  . Intimate partner violence:    Fear of current or ex partner: Not on file    Emotionally abused: Not on file    Physically abused: Not on file    Forced sexual activity: Not on file  Other Topics Concern  . Not on file  Social History Narrative   Lives at home w/ his wife   Patient is right handed.   Patient drinks 2 cups caffeine daily.    Family History  Problem Relation Age of Onset  . Alzheimer's disease Mother 43  . Emphysema Father    Socially he is married and has 3 children, 14 grandchildren and 3 great-grandchildren. There is no tobacco or alcohol use.  ROS General: Negative; No fevers, chills, or night sweats;  HEENT: Positive for 2 transient episodes over the past year with left lateral eye field defect, sinus congestion, difficulty swallowing Pulmonary: Negative; No cough, wheezing, shortness of breath, hemoptysis Cardiovascular: See history of present illness GI: Negative; No nausea, vomiting, diarrhea, or abdominal pain GU: Negative; No dysuria, hematuria, or difficulty voiding Musculoskeletal: Positive for history of gout Hematologic/Oncology: Negative; no easy bruising, bleeding Endocrine: Negative; no heat/cold intolerance; no diabetes Neuro: Positive for h/o amaurosis fugax; resolved since Plavix was instituted Skin: Negative; No rashes or  skin lesions Psychiatric: Negative; No behavioral problems, depression Sleep: Positive for sleep apnea.  With BiPAP therapy he is unaware of any breakthrough snoring, daytime sleepiness, hypersomnolence, bruxism, restless legs, hypnogognic hallucinations, no cataplexy Other comprehensive 14 point system review is negative.   PE BP (!) 112/56   Pulse (!) 50   Ht 6' (1.829 m)   Wt 199 lb (90.3 kg)   BMI 26.99 kg/m    Repeat blood pressure was 122/62  Wt Readings from Last 3 Encounters:  01/21/18 199 lb (90.3 kg)  09/12/17 199 lb 3.2 oz (90.4 kg)  05/29/17 199 lb 6.4 oz (90.4 kg)   General: Alert, oriented, no distress.  Skin: normal turgor, no rashes, warm and dry HEENT: Normocephalic, atraumatic. Pupils equal round and reactive to light; sclera anicteric; extraocular muscles intact;  Nose without nasal septal hypertrophy Mouth/Parynx benign; Mallinpatti scale 3 Neck: No JVD, no carotid bruits; normal carotid upstroke Lungs: clear to ausculatation and percussion; no wheezing or rales Chest wall: without tenderness to palpitation Heart: PMI not displaced, RRR, s1 s2 normal, 1/6 systolic murmur, no diastolic murmur, no rubs, gallops, thrills, or heaves Abdomen: soft, nontender; no hepatosplenomehaly, BS+; abdominal  aorta nontender and not dilated by palpation. Back: no CVA tenderness Pulses 2+ Musculoskeletal: full range of motion, normal strength, no joint deformities Extremities: no clubbing cyanosis or edema, Homan's sign negative  Neurologic: grossly nonfocal; Cranial nerves grossly wnl Psychologic: Normal mood and affect   ECG (independently read by me): Sinus bradycardia at 50 bpm.  First-degree AV block.  No significant ST segment changes.  January 2019 ECG (independently read by me): Sinus bradycardia 59 bpm.  Borderline first degree AV block with a PR interval 202 ms; QTC normal at 394 ms.  No ectopy.  April 2018 ECG (independently read by me): Normal sinus rhythm at  60, normal intervals.  No ST segment changes  October 2017 ECG (independently read by me): Sinus bradycardia at 57 bpm.  PR interval 200 ms.  QTc interval 395 ms.  July 2017 ECG (independently read by me): Normal sinus rhythm at 61 bpm.  Normal QTc with borderline first-degree AV block  November 2016 ECG (independently read by me): Normal sinus rhythm at 60 bpm.  PR interval 200 ms.  June 2016 ECG (independently read by me): Sinus rhythm at 63.  Intervals normal.  No ST segment changes  December 2015 ECG (independently read by me): Sinus bradycardia 59 bpm.  Mild first-degree AV block with a PR interval 212 ms.  QTc interval normal.  No ST segment changes.  June 2015 ECG (independently read by me): sinus rhythm at 59 beats per minute.  Borderline first degree AV block with a PR interval of 204 ms.  ECG: Sinus rhythm at 61 beats per minute. Borderline first degree AV block with PR interval 200 ms. QT interval normal 388 ms.  LABS: BMP Latest Ref Rng & Units 12/18/2016 03/20/2016 07/19/2015  Glucose 65 - 99 mg/dL 95 97 91  BUN 7 - 25 mg/dL 12 20 26(H)  Creatinine 0.70 - 1.18 mg/dL 1.05 1.22(H) 1.19(H)  Sodium 135 - 146 mmol/L 142 143 142  Potassium 3.5 - 5.3 mmol/L 4.1 4.2 4.4  Chloride 98 - 110 mmol/L 110 104 103  CO2 20 - 31 mmol/L _0 Calcium 8.6 - 10.3 mg/dL 8.5(L) 9.0 9.1   Hepatic Function Latest Ref Rng & Units 12/18/2016 03/20/2016 07/19/2015  Total Protein 6.1 - 8.1 g/dL 6.1 6.8 6.8  Albumin 3.6 - 5.1 g/dL 3.6 3.9 3.8  AST 10 - 35 U/L _1 ALT 9 - 46 U/L 14 17 38  Alk Phosphatase 40 - 115 U/L 46 56 61  Total Bilirubin 0.2 - 1.2 mg/dL 0.8 0.6 1.0   CBC Latest Ref Rng & Units 12/18/2016 03/20/2016 07/19/2015  WBC 3.8 - 10.8 K/uL 6.2 6.3 6.0  Hemoglobin 13.2 - 17.1 g/dL 13.0(L) 14.9 14.6  Hematocrit 38.5 - 50.0 % 39.5 44.6 42.3  Platelets 140 - 400 K/uL 149 155 150   Lab Results  Component Value Date   MCV 96.8 12/18/2016   MCV 95.1 03/20/2016   MCV 93.6 07/19/2015    Lab Results  Component Value Date   TSH 1.76 12/18/2016  No results found for: HGBA1C   Lipid Panel      Component Value Date/Time   CHOL 83 12/18/2016 0803   TRIG 93 12/18/2016 0803   HDL 28 (L) 12/18/2016 0803   CHOLHDL 3.0 12/18/2016 0803   VLDL 19 12/18/2016 0803   LDLCALC 36 12/18/2016 0803     RADIOLOGY: No results found.  IMPRESSION:  1. Essential hypertension   2. Exertional shortness of breath  3. OSA (obstructive sleep apnea)   4. Pure hypercholesterolemia   5. Grade I diastolic dysfunction     ASSESSMENT AND PLAN: Mr. Josejulian Tarango is a 82 year old gentleman who has a history of hypertension, hyperlipidemia, intermittent peripheral edema, obstructive sleep apnea, currently on BiPAP, as well as remote history of gout. He has documented bilateral renal cysts and is status post TURP.He has an increased postvoid residual and straight caths himself one time every day.  He continues to Dr. [name] for urologic follow-up.  He continues to use BiPap and received a new ResMed AirCurve 10  Auto BiPAP machine.  I reviewed his download which is excellent.  AHI is 1.0.  We again discussed improved sleep duration and ideally sleeping 7 to 8 hours per night if at all possible.  He is unaware of breakthrough snoring.  I last saw him, he was noticing some intermittent palpitations and these have improved with the addition of diltiazem to his current medical regimen.  His blood pressure today is stable on his regimen consisting of Cardizem 767 mg, Bystolic 20 mill grams, lisinopril 40 mg in addition to spironolactone 12.5 mg and very low-dose torsemide at 5 mg.  Spironolactone at low dose had remotely been added due to his previous diagnosis of grade 1 diastolic dysfunction.  He does not have any further peripheral edema on this regimen.  He is bradycardic and ECG reveals heart rate at 50.  He has continued to experience some fatigue as well as exertional dyspnea.  I have suggested he try  reducing Bystolic down to 10 mg to see if this improves his symptoms with more energy.  If he notes increase palpitations he may then need to increase this to 15 mg.  Continues to be on rosuvastatin 20 mg for hyperlipidemia and was last checked LDH was excellent at 36.  He continues to be seen by Dr. [name] for urology follow-up and is on Flomax in addition to testosterone.  His last echo Doppler study was in 2017.  In September he will undergo a 2-year follow-up echo Doppler study to reassess systolic as well as diastolic function.  I will see him in October for follow-up evaluation.  Time spent: 25 minutes  Troy Sine, MD, [facility]  01/22/2018 1:03 PM

## 2018-01-22 ENCOUNTER — Encounter: Payer: Self-pay | Admitting: Cardiovascular Disease

## 2018-01-28 ENCOUNTER — Encounter: Payer: Self-pay | Admitting: Cardiovascular Disease

## 2018-02-13 ENCOUNTER — Other Ambulatory Visit: Payer: Self-pay | Admitting: Cardiovascular Disease

## 2018-04-16 ENCOUNTER — Ambulatory Visit (HOSPITAL_COMMUNITY): Payer: Medicare Other | Attending: Cardiovascular Disease

## 2018-04-16 ENCOUNTER — Other Ambulatory Visit: Payer: Self-pay

## 2018-04-16 DIAGNOSIS — I10 Essential (primary) hypertension: Secondary | ICD-10-CM | POA: Insufficient documentation

## 2018-04-16 DIAGNOSIS — I351 Nonrheumatic aortic (valve) insufficiency: Secondary | ICD-10-CM | POA: Diagnosis not present

## 2018-04-16 DIAGNOSIS — R0602 Shortness of breath: Secondary | ICD-10-CM | POA: Diagnosis present

## 2018-04-16 DIAGNOSIS — E785 Hyperlipidemia, unspecified: Secondary | ICD-10-CM | POA: Insufficient documentation

## 2018-04-17 ENCOUNTER — Telehealth: Payer: Self-pay | Admitting: *Deleted

## 2018-04-17 NOTE — Telephone Encounter (Signed)
    [facility] Pre-operative Risk Assessment    Request for surgical clearance:  1. What type of surgery is being performed? Endoscopic Ultrasound   2. When is this surgery scheduled? 04/26/18   3. What type of clearance is required (medical clearance vs. Pharmacy clearance to hold med vs. Both)? pharmacy  4. Are there any medications that need to be held prior to surgery and how long? Plavix   5. Practice name and name of physician performing surgery? [facility] Dr. Christoper Fabian   6. What is your office phone number [phone]    7.   What is your office fax number [phone]  8.   Anesthesia type (None, local, MAC, general) ?    Takuya Lariccia A Jecenia Leamer 04/17/2018, 3:15 PM  _________________________________________________________________   (provider comments below)

## 2018-04-18 NOTE — Telephone Encounter (Signed)
Okay to hold Plavix for 5 days prior to procedure 

## 2018-04-18 NOTE — Telephone Encounter (Signed)
   Primary Cardiologist: Nicki Guadalajara, MD  Chart reviewed as part of pre-operative protocol coverage. Patient was contacted 04/18/2018 in reference to pre-operative risk assessment for pending surgery as outlined below.  Kenneth [name] was last seen on  01/21/18 by Dr. Tresa Endo.  Since that day, Kenneth [name] has done well. He is getting > 4 mets of activity.   Therefore, based on ACC/AHA guidelines, the patient would be at acceptable risk for the planned procedure without further cardiovascular testing.   He takes plavix for possible TIA which is managed by Dr. Tresa Endo per patient.   Dr. Tresa Endo is it okay to hold plavix and how long?  Please forward your response to [facility] PREOP. Thank you   Manson Passey, PA 04/18/2018, 2:45 PM

## 2018-06-13 ENCOUNTER — Ambulatory Visit (INDEPENDENT_AMBULATORY_CARE_PROVIDER_SITE_OTHER): Payer: Medicare Other | Admitting: Cardiovascular Disease

## 2018-06-13 ENCOUNTER — Other Ambulatory Visit: Payer: Self-pay | Admitting: Cardiovascular Disease

## 2018-06-13 ENCOUNTER — Encounter: Payer: Self-pay | Admitting: Cardiovascular Disease

## 2018-06-13 VITALS — BP 120/64 | HR 59 | Ht 72.0 in | Wt 194.4 lb

## 2018-06-13 DIAGNOSIS — R0602 Shortness of breath: Secondary | ICD-10-CM | POA: Diagnosis not present

## 2018-06-13 DIAGNOSIS — I351 Nonrheumatic aortic (valve) insufficiency: Secondary | ICD-10-CM

## 2018-06-13 DIAGNOSIS — G4733 Obstructive sleep apnea (adult) (pediatric): Secondary | ICD-10-CM

## 2018-06-13 DIAGNOSIS — E7800 Pure hypercholesterolemia, unspecified: Secondary | ICD-10-CM | POA: Diagnosis not present

## 2018-06-13 DIAGNOSIS — I519 Heart disease, unspecified: Secondary | ICD-10-CM

## 2018-06-13 DIAGNOSIS — I10 Essential (primary) hypertension: Secondary | ICD-10-CM | POA: Diagnosis not present

## 2018-06-13 DIAGNOSIS — I5189 Other ill-defined heart diseases: Secondary | ICD-10-CM

## 2018-06-13 NOTE — Patient Instructions (Signed)
Medication Instructions:  Your physician recommends that you continue on your current medications as directed. Please refer to the Current Medication list given to you today.  If you need a refill on your cardiac medications before your next appointment, please call your pharmacy.   Follow-Up: At [facility], you and your health needs are our priority.  As part of our continuing mission to provide you with exceptional heart care, we have created designated Provider Care Teams.  These Care Teams include your primary Cardiologist (physician) and Advanced Practice Providers (APPs -  Physician Assistants and Nurse Practitioners) who all work together to provide you with the care you need, when you need it. You will need a follow up appointment in 6 months.  Please call our office 2 months in advance to schedule this appointment.  You may see Nicki Guadalajara, MD or one of the following Advanced Practice Providers on your designated Care Team: [place], [place] . Micah Flesher, PA-C  Any Other Special Instructions Will Be Listed Below (If Applicable). We will send orders to [facility] for a new type of mask

## 2018-06-13 NOTE — Progress Notes (Signed)
Patient ID: Kenneth [name], male   DOB: 07-25-1936, 82 y.o.   MRN: [id number]     PCP: Dr. Hampton [name] at [facility] in [place]  HPI: Kenneth [name] is a 82 y.o. male who presents for a 4 month f/u sleep/cardiology evaluation.  Kenneth [name] has a history of hypertension, hyperlipidemia, gout, and is status post TURP surgery by Dr. Amalia [name].  He has a residual underactive bladder and as result straight caths himself one time every day.  He states his post void residuals are in the 250-300 cc range.  He also has  bilateral renal cysts. He has a history of documented moderate obstructive sleep apnea with an AHI of 22.7 and an RDI of 33 and has been on BiPAP Auto therapy with an EPAP minimum of 10, IPAP maximum potential up to 25 with a delta of for which he started in July 2012. 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  Recently, he has had issues with very dry mouth.  He uses a nasal mask.  He has had intermittent use of a chin strap.    An echo Doppler study in 05/2013 revealed normal left ventricle wall thickness with normal systolic and diastolic function. He did have mild aortic regurgitation due to poor central leaflet coaptation. There was borderline prolapse of his anterior mitral valve leaflet with trivial mitral regurgitation. He had normal pulmonary pressures assessment artery systolic pressure 19 mm.  He has had issues in the past with peripheral edema, which had led to an increased dose of his torsemide last year.  He denies exertionally precipitated chest pain.  He did experience one episode of some chest fullness or night when he was sitting on his bed, but this ultimately resolved, and he has not had any further sensation He is unaware of palpitations.  He denies presyncope or syncope.  In December 2015, he complained of experiencing transient episodes of a field defect in the lateral left eye.  These were both self-limiting.  He saw an ophthalmologist  who recommended that he notify me concerning the symptoms  His history is notable in that he did sustain to prior head injuries many years ago.  Once when he was in high school and had a severe concussion.  Another time occurred when he fell out of a tree stand when he was hunting.  I subtotal referred him to [facility] neurology.  Due to my concern for possible amaurosis fugax.  He saw Dr. Floyde [name], who felt that the patient had experienced at least 3 episodes of amaurosis fugax in his left eye.  He underwent an MRI of the brain that showed mild small vessel ischemic changes in the MRA of the head and carotid Doppler studies which were unremarkable.  He was taken off aspirin and started on Plavix 75 mg.  He denies any recurrent symptomatology.  His primary physician is Dr. Janna [name] in [place].   He has a history of hyperlipidemia and is tolerating Crestor 20 mg.  He has been on amlodipine 5 mg, Bystolic 20 mg, and lisinopril 40 mg for hypertension.  He was found to have increasing PSA levels with his levels rising from 1.5  to 7 and saw Dr. Amalia [name] for biopsy. He underwent cholecystectomy and at that time his ECG showed a PR interval at 212 ms, which was of concern by the anesthesiologist.  He tolerated surgery well without cardiovascular compromise.   In 2017 he had noticed a change in development  of shortness of breath with less activity.  He denied associated chest tightness and was experiencing shortness of breath with walking less than 100 yards.  This has been ongoing for several months.  He had noticed some trace swelling in his ankles.  A nuclear stress test which was done on 04/11/2016.  Ejection fraction was 59%.  There were no ECG changes.  There was normal perfusion.  An echo Doppler study on 04/12/2016 showed mild LVH with normal systolic function with very mild grade 1 diastolic dysfunction.  There was mild aortic insufficiency and trivial TR and PR.  Pulmonary pressures were  normal.  From a sleep perspective, he continues to use his BiPAP therapy.  He admits to his mouth getting dry during the day.  He has a nasal mask.  He may be having oral venting.  In the past.  He was given a chin strap but has not been consistently using this.  He has not had any recent download.  He believes his sleep initiation is good but he is having difficulty with sleep maintenance. He uses [employer] for his DME company.    He had experienced swelling in his lower extremity and a lower extremity Doppler study was negative for DVT in [place].  He was able to walk a mile without significant abnormalities, but when he has to carry his garbage can to the curb he notes shortness of breath.    Since his office visit in April 2018, he has continued to be without chest pain.  There is no change in his previous shortness of breath.  I had attempted to initiate spironolactone with his diastolic dysfunction but apparently during my absence heat, cold, the office and was concerned that he had not noticed any significant change.  He was advised to go back and take one half of his previous dose of torsemide and one half of spironolactone.  He has been doing this and actually he has felt somewhat better.  He continues to take lisinopril 40 mg, Bystolic 20 mg, and amlodipine for hypertension.  He denies significant swelling.  He is on Crestor for hyperlipidemia.   When I saw him in October 2018, he had received a new BiPAP machine, ResMed air curve 10 feet auto.  His set up date was 07/06/2017.  His minimum EPAP pressure is 9 with a maximum IPAP pressure of 25.  In the office today.  I obtained a download from 08/12/2017 through 09/10/2017.  He was 100% compliant with usage stays and usage greater than 4 hours was 97%.  He is averaging almost 6 hours and 30 minutes of sleep per night.  AHI is excellent at 1.2 and his 95th percentile pressure was 14.7/10.8 with a maximum average pressure of 15.8/11.8.  He has  noticed significant improvement with his new machine.  He is sleeping well.  When I last saw him he had noticed rare palpitations which he describes as a chest flutter, which may last up to 30 seconds.  He has noticed this rarely every 2-3 months and typically there is an abrupt onset and abrupt cessation. Marland Kitchen  He denies presyncope or syncope.  He denies associated chest tightness.  At that office visit, I discontinued amlodipine and change this to Cardizem CD 120 mg at night and also gave a prescription from a told metoprolol tartrate to take 12.5 to 25 mg as needed.  I also discussed improved sleep duration at all possible.  I last saw him in June 2019.  However his major complaint is that of fatigue well as exertional dyspnea.  He denies any leg swelling.  He denies chest pain.  Is continued to be on Bystolic 20 mg, diltiazem CD 120 daily, lisinopril 40 mg daily, in addition to low-dose spironolactone 12.5 mg and torsemide just 5 mg.  Continues to use BiPAP and a download from Dec 22, 2017 through January 20, 2018 shows 100% compliance.  However, sleep duration was only 5 hours and 45 minutes.  95th percentile pressure was 15/11 with a maximum pressure 16/12.  AHI is excellent at 1.0.    Over the past 4 to 5 months, he continues to experience some fatigue.  He has been evaluated by Dr. Christoper Fabian at [facility] with pain and diarrhea.  He tells me he will be undergoing an abdominal vascular MRI.  He also is status post endoscopy.  He denies chest pain.  At times he notes shortness of breath particularly if he has to [place] but denies shortness of breath with walking.  He continues to use CPAP.  Really he has had trouble with his mask particularly with the seal on the bridge of his nose.  He typically goes to bed between around 10 PM and wakes up at 6 AM.  In July a download showed 100% usage which an AHI of 4.3.  Last download in September showed increased AHI at 9.9 and at that time he was not compliant due to mask  issues and mask leak. He presents for reevaluation.    Past Medical History:  Diagnosis Date  . Amaurosis fugax 09/22/2014  . Diastolic dysfunction    grade 1 by echo 4/11  . Dyslipidemia   . HTN (hypertension)   . Sleep apnea    on BiPap    Past Surgical History:  Procedure Laterality Date  . CATARACT EXTRACTION Bilateral   . CHOLECYSTECTOMY    . SEPTOPLASTY  2000  . TONSILLECTOMY    . TRANSURETHRAL RESECTION OF PROSTATE  1997    No Known Allergies  Current Outpatient Medications  Medication Sig Dispense Refill  . allopurinol (ZYLOPRIM) 100 MG tablet Take 200 mg by mouth daily.     . Cholecalciferol (VITAMIN D-1000 MAX ST) 1000 UNITS tablet Take 1,000 Units by mouth daily.    . clopidogrel (PLAVIX) 75 MG tablet Take 1 tablet (75 mg total) by mouth daily. 90 tablet 2  . diclofenac sodium (VOLTAREN) 1 % GEL Apply 1 application topically as needed.    . ferrous sulfate 325 (65 FE) MG EC tablet Take 325 mg by mouth daily.    . fish oil-omega-3 fatty acids 1000 MG capsule Take 1,200 mg by mouth daily.     Marland Kitchen lisinopril (PRINIVIL,ZESTRIL) 40 MG tablet TAKE 1 TABLET BY MOUTH  DAILY 90 tablet 1  . loratadine (CLARITIN) 10 MG tablet Take 10 mg by mouth daily.    . Melatonin 3 MG TABS Take 1 tablet by mouth at bedtime as needed.    . metoprolol tartrate (LOPRESSOR) 25 MG tablet TAKE 1/2 TO 1 TABLET BY MOUTH AS NEEDED FOR PALPITATIONS 30 tablet 11  . Nebivolol HCl (BYSTOLIC) 20 MG TABS Take 0.5 tablets (10 mg total) by mouth daily. 90 tablet 3  . rosuvastatin (CRESTOR) 20 MG tablet TAKE 1 TABLET BY MOUTH  DAILY 90 tablet 1  . spironolactone (ALDACTONE) 25 MG tablet Take 0.5 tablets (12.5 mg total) by mouth daily. 90 tablet 1  . tamsulosin (FLOMAX) 0.4 MG CAPS Take 0.4 mg by mouth  daily after supper.    . testosterone cypionate (DEPOTESTOSTERONE CYPIONATE) 200 MG/ML injection Inject 400 mg into the muscle every 30 (thirty) days.    Marland Kitchen zolpidem (AMBIEN) 5 MG tablet Take 1 tablet (5 mg  total) by mouth at bedtime as needed. for sleep 30 tablet 2  . diltiazem (CARDIZEM CD) 120 MG 24 hr capsule Take 1 capsule (120 mg total) by mouth daily. 90 capsule 3   No current facility-administered medications for this visit.     Social History   Socioeconomic History  . Marital status: Single    Spouse name: Collie Siad  . Number of children: 3  . Years of education: MD  . Highest education level: Not on file  Occupational History  . Occupation: Retired  Scientific laboratory technician  . Financial resource strain: Not on file  . Food insecurity:    Worry: Not on file    Inability: Not on file  . Transportation needs:    Medical: Not on file    Non-medical: Not on file  Tobacco Use  . Smoking status: Former Research scientist (life sciences)  . Smokeless tobacco: Never Used  Substance and Sexual Activity  . Alcohol use: No  . Drug use: No  . Sexual activity: Not on file  Lifestyle  . Physical activity:    Days per week: Not on file    Minutes per session: Not on file  . Stress: Not on file  Relationships  . Social connections:    Talks on phone: Not on file    Gets together: Not on file    Attends religious service: Not on file    Active member of club or organization: Not on file    Attends meetings of clubs or organizations: Not on file    Relationship status: Not on file  . Intimate partner violence:    Fear of current or ex partner: Not on file    Emotionally abused: Not on file    Physically abused: Not on file    Forced sexual activity: Not on file  Other Topics Concern  . Not on file  Social History Narrative   Lives at home w/ his wife   Patient is right handed.   Patient drinks 2 cups caffeine daily.    Family History  Problem Relation Age of Onset  . Alzheimer's disease Mother 54  . Emphysema Father    Socially he is married and has 3 children, 14 grandchildren and 3 great-grandchildren. There is no tobacco or alcohol use.  ROS General: Negative; No fevers, chills, or night sweats;  HEENT:  Positive for 2 transient episodes over the past year with left lateral eye field defect, sinus congestion, difficulty swallowing Pulmonary: Negative; No cough, wheezing, shortness of breath, hemoptysis Cardiovascular: See history of present illness GI: Negative; No nausea, vomiting, diarrhea, or abdominal pain GU: Negative; No dysuria, hematuria, or difficulty voiding Musculoskeletal: Positive for history of gout Hematologic/Oncology: Negative; no easy bruising, bleeding Endocrine: Negative; no heat/cold intolerance; no diabetes Neuro: Positive for h/o amaurosis fugax; resolved since Plavix was instituted Skin: Negative; No rashes or skin lesions Psychiatric: Negative; No behavioral problems, depression Sleep: Positive for sleep apnea.  With BiPAP therapy he is unaware of any breakthrough snoring, daytime sleepiness, hypersomnolence, bruxism, restless legs, hypnogognic hallucinations, no cataplexy Other comprehensive 14 point system review is negative.   PE BP 120/64   Pulse (!) 59   Ht 6' (1.829 m)   Wt 194 lb 6.4 oz (88.2 kg)   BMI 26.37 kg/m  Repeat blood pressure by me 130/70.  Wt Readings from Last 3 Encounters:  06/13/18 194 lb 6.4 oz (88.2 kg)  01/21/18 199 lb (90.3 kg)  09/12/17 199 lb 3.2 oz (90.4 kg)    General: Alert, oriented, no distress.  Skin: normal turgor, no rashes, warm and dry HEENT: Normocephalic, atraumatic. Pupils equal round and reactive to light; sclera anicteric; extraocular muscles intact;  Nose without nasal septal hypertrophy Mouth/Parynx benign; Mallinpatti scale 3 Neck: No JVD, no carotid bruits; normal carotid upstroke Lungs: clear to ausculatation and percussion; no wheezing or rales Chest wall: without tenderness to palpitation Heart: PMI not displaced, RRR, s1 s2 normal, 1/6 systolic murmur, 1/6 diastolic murmur, no rubs, gallops, thrills, or heaves Abdomen: soft, nontender; no hepatosplenomehaly, BS+; abdominal aorta nontender and not  dilated by palpation. Back: no CVA tenderness Pulses 2+ Musculoskeletal: full range of motion, normal strength, no joint deformities Extremities: no clubbing cyanosis or edema, Homan's sign negative  Neurologic: grossly nonfocal; Cranial nerves grossly wnl Psychologic: Normal mood and affect   ECG (independently read by me): Sinus bradycardia with first-degree AV block.  PR interval 214 ms.  No ST segment changes.  No ectopy.  ECG (independently read by me): Sinus bradycardia at 50 bpm.  First-degree AV block.  No significant ST segment changes.  January 2019 ECG (independently read by me): Sinus bradycardia 59 bpm.  Borderline first degree AV block with a PR interval 202 ms; QTC normal at 394 ms.  No ectopy.  April 2018 ECG (independently read by me): Normal sinus rhythm at 60, normal intervals.  No ST segment changes  October 2017 ECG (independently read by me): Sinus bradycardia at 57 bpm.  PR interval 200 ms.  QTc interval 395 ms.  July 2017 ECG (independently read by me): Normal sinus rhythm at 61 bpm.  Normal QTc with borderline first-degree AV block  November 2016 ECG (independently read by me): Normal sinus rhythm at 60 bpm.  PR interval 200 ms.  June 2016 ECG (independently read by me): Sinus rhythm at 63.  Intervals normal.  No ST segment changes  December 2015 ECG (independently read by me): Sinus bradycardia 59 bpm.  Mild first-degree AV block with a PR interval 212 ms.  QTc interval normal.  No ST segment changes.  June 2015 ECG (independently read by me): sinus rhythm at 59 beats per minute.  Borderline first degree AV block with a PR interval of 204 ms.  ECG: Sinus rhythm at 61 beats per minute. Borderline first degree AV block with PR interval 200 ms. QT interval normal 388 ms.  LABS: BMP Latest Ref Rng & Units 12/18/2016 03/20/2016 07/19/2015  Glucose 65 - 99 mg/dL 95 97 91  BUN 7 - 25 mg/dL 12 20 26(H)  Creatinine 0.70 - 1.18 mg/dL 1.05 1.22(H) 1.19(H)  Sodium 135 -  146 mmol/L 142 143 142  Potassium 3.5 - 5.3 mmol/L 4.1 4.2 4.4  Chloride 98 - 110 mmol/L 110 104 103  CO2 20 - 31 mmol/L _0 Calcium 8.6 - 10.3 mg/dL 8.5(L) 9.0 9.1   Hepatic Function Latest Ref Rng & Units 12/18/2016 03/20/2016 07/19/2015  Total Protein 6.1 - 8.1 g/dL 6.1 6.8 6.8  Albumin 3.6 - 5.1 g/dL 3.6 3.9 3.8  AST 10 - 35 U/L _1 ALT 9 - 46 U/L 14 17 38  Alk Phosphatase 40 - 115 U/L 46 56 61  Total Bilirubin 0.2 - 1.2 mg/dL 0.8 0.6 1.0   CBC Latest  Ref Rng & Units 12/18/2016 03/20/2016 07/19/2015  WBC 3.8 - 10.8 K/uL 6.2 6.3 6.0  Hemoglobin 13.2 - 17.1 g/dL 13.0(L) 14.9 14.6  Hematocrit 38.5 - 50.0 % 39.5 44.6 42.3  Platelets 140 - 400 K/uL 149 155 150   Lab Results  Component Value Date   MCV 96.8 12/18/2016   MCV 95.1 03/20/2016   MCV 93.6 07/19/2015   Lab Results  Component Value Date   TSH 1.76 12/18/2016  No results found for: HGBA1C   Lipid Panel      Component Value Date/Time   CHOL 83 12/18/2016 0803   TRIG 93 12/18/2016 0803   HDL 28 (L) 12/18/2016 0803   CHOLHDL 3.0 12/18/2016 0803   VLDL 19 12/18/2016 0803   LDLCALC 36 12/18/2016 0803     RADIOLOGY: No results found.  IMPRESSION:  1. Essential hypertension   2. Exertional shortness of breath   3. OSA (obstructive sleep apnea)   4. Pure hypercholesterolemia   5. Grade I diastolic dysfunction   6. Nonrheumatic aortic valve insufficiency     ASSESSMENT AND PLAN: Kenneth [name] is a 82 year-old gentleman who has a history of hypertension, hyperlipidemia, intermittent peripheral edema, obstructive sleep apnea, currently on BiPAP, as well as remote history of gout. He has documented bilateral renal cysts and is status post TURP.He has an increased postvoid residual and straight caths himself one time every day.  He continues to Dr. Amalia [name] for urologic follow-up.  He continues to use BiPap and received a new ResMed AirCurve 10  Auto BiPAP machine he has been previously demonstrated to be  compliant with an excellent AHI at 1.0.  However, recently he has had issues with his mask.  His most recent download has shown only CPAP usage at less than 3 hours per night and AHI was increased at 9.9 due to consistent mask leak.  He has purchased a Leisure centre manager for cleansing.  He breathes through his mouth and had used a full facemask.  I have recommended he change his mask and will write a prescription for a ResMed AirFit F30.  His blood pressure today is stable.  He has had issues with abdominal pain and diarrhea and will be undergoing an abdominal MRI evaluation to assess abdominal blood flow with his GI physician at [facility].  He has recently seen Dr. Amalia [name] in follow-up of his underactive bladder.  I reviewed recent laboratory from his primary physician Dr. Susie Cassette.  I reviewed his echo Doppler study from September 2019 which showed EF of 55 to 60% with grade 1 diastolic dysfunction.  He had mild to moderate aortic insufficiency.  There was mild RA dilation.  He continues to be on rosuvastatin for hyperlipidemia and is tolerating this well.  His blood pressure today is stable on diltiazem 120 mg, lisinopril 40 mg, nebivolol 10 mg and spironolactone 12.5 mg daily.  I will see him in 6 months for reevaluation.  Time spent: 25 minutes  Kenneth Sine, MD, [facility]  06/15/2018 11:00 AM

## 2018-06-15 ENCOUNTER — Encounter: Payer: Self-pay | Admitting: Cardiovascular Disease

## 2018-08-05 ENCOUNTER — Telehealth: Payer: Self-pay | Admitting: *Deleted

## 2018-08-05 NOTE — Telephone Encounter (Signed)
   [facility] Pre-operative Risk Assessment    Request for surgical clearance:  1. What type of surgery is being performed? Right endoscopic dacrystorhinostomy with silicone stent insertion   2. When is this surgery scheduled? 09/09/18  3. What type of clearance is required (medical clearance vs. Pharmacy clearance to hold med vs. Both)?  both  4. Are there any medications that need to be held prior to surgery and how long? Plavix   5. Practice name and name of physician performing surgery? [facility] Dr. Lorina Rabon   6. What is your office phone number [phone]    7.   What is your office fax number [phone]  8.   Anesthesia type (None, local, MAC, general) ? general   Kenneth [name] Kenneth [name] 08/05/2018, 10:25 AM  _________________________________________________________________   (provider comments below)

## 2018-08-06 NOTE — Telephone Encounter (Signed)
   Primary Cardiologist: Nicki Guadalajarahomas Kelly, MD  Chart reviewed as part of pre-operative protocol coverage. Patient was contacted 08/06/2018 in reference to pre-operative risk assessment for pending surgery as outlined below.  Kenneth BickersJames [name] was last seen 06/13/18 by Dr. Tresa EndoKelly. H/o HTN, HLD, gout, TURP, OSA, possible amaurosis fugax (on Plavix), mild-mod AI, EF 55-60%, grade 1 DD. I spoke with patient and he affirms he is feeling great without any change from OV 06/13/18. No CP, SOB, palpitations, syncope. RCRI 0.9% indicating low risk of CV complications. Therefore, based on ACC/AHA guidelines, the patient would be at acceptable risk for the planned procedure without further cardiovascular testing.   Per further review of chart, in 04/2018 Dr. Tresa EndoKelly previously cleared patient to hold Plavix for 5 days prior to a procedure. Given no significant change in patient status since that time, this recommendation would still apply if it is deemed necessary to hold blood thinner therapy for this procedure. Would resume as soon as felt safe by surgeon.  I will route this recommendation to the requesting party via Epic fax function and remove from pre-op pool.  Please call with questions.  Laurann Montanaayna N Jerlean Peralta, PA-C 08/06/2018, 2:29 PM

## 2018-08-15 ENCOUNTER — Other Ambulatory Visit: Payer: Self-pay | Admitting: Cardiovascular Disease

## 2018-08-16 ENCOUNTER — Other Ambulatory Visit: Payer: Self-pay | Admitting: Cardiovascular Disease

## 2018-08-28 ENCOUNTER — Telehealth: Payer: Self-pay

## 2018-08-28 NOTE — Telephone Encounter (Signed)
   [facility] Pre-operative Risk Assessment    Request for surgical clearance:  1. What type of surgery is being performed? Right endoscopic dacryocystorhinostomy with silicone stent insertion    2. When is this surgery scheduled?  09/09/2018  3. What type of clearance is required (medical clearance vs. Pharmacy clearance to hold med vs. Both)? Both  4. Are there any medications that need to be held prior to surgery and how long? Plavix    5. Practice name and name of physician performing surgery? [facility] - Dr.Zaldivar   6. What is your office phone number  [phone]   7.   What is your office fax number [phone]  8.   Anesthesia type (None, local, MAC, general) ? General    Kenneth [name] 08/28/2018, 4:32 PM  _________________________________________________________________   (provider comments below)

## 2018-08-30 NOTE — Telephone Encounter (Signed)
   Chart reviewed as part of the pre-operative protocol coverage. Patient was cleared for this procedure 08/06/18 and faxed to Dr. Vella Raring office via [employer]. I have resent that clearance form to Dr. Vella Raring office and will remove from the pre-op pool.   Beatriz Stallion, PA-C 08/30/18, 4:05pm

## 2018-08-30 NOTE — Telephone Encounter (Signed)
LMOMV OF OFFICE TO CONTACT CLINIC BACK  IF NOT RECEIVED  FAXED .PAPER FAXED ALSO

## 2018-10-01 ENCOUNTER — Ambulatory Visit (INDEPENDENT_AMBULATORY_CARE_PROVIDER_SITE_OTHER): Payer: Medicare Other | Admitting: Physician Assistant

## 2018-10-01 ENCOUNTER — Encounter: Payer: Self-pay | Admitting: Physician Assistant

## 2018-10-01 VITALS — BP 121/60 | HR 57 | Ht 72.0 in | Wt 193.0 lb

## 2018-10-01 DIAGNOSIS — I2584 Coronary atherosclerosis due to calcified coronary lesion: Secondary | ICD-10-CM | POA: Diagnosis not present

## 2018-10-01 DIAGNOSIS — G4733 Obstructive sleep apnea (adult) (pediatric): Secondary | ICD-10-CM

## 2018-10-01 DIAGNOSIS — E785 Hyperlipidemia, unspecified: Secondary | ICD-10-CM | POA: Diagnosis not present

## 2018-10-01 DIAGNOSIS — I10 Essential (primary) hypertension: Secondary | ICD-10-CM | POA: Diagnosis not present

## 2018-10-01 DIAGNOSIS — I2510 Atherosclerotic heart disease of native coronary artery without angina pectoris: Secondary | ICD-10-CM

## 2018-10-01 NOTE — Progress Notes (Signed)
Cardiology Office Note    Date:  10/03/2018   ID:  Kenneth BickersJames [name], DOB 1936/04/20, MRN [id number]  PCP:  Kenneth [name], Kenneth A, MD  Cardiologist: Kenneth [name]  Chief Complaint  Patient presents with  . Follow-up    seen for Kenneth [name], low BP recently    History of Present Illness:  Kenneth [name] is a 83 y.o. male with PMH of HTN, HLD, and OSA on BiPAP.  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  Echocardiogram in October 2014 showed normal LV function, mild aortic regurgitation due to poor central leaflet coaptation, borderline prolapse of anterior mitral valve leaflet with trivial mitral regurgitation.  In December 2015, he presented with a episode of amaurosis fugax.  MRI of the brain showed small vessel ischemic changes.  MRA of the head and neck carotid Doppler were unremarkable.  He was taken off of aspirin and started on Plavix.  Myoview in August 2017 showed EF 59%, normal perfusion.  Echocardiogram in August 2017 showed mild LVH, normal systolic function, grade 1 DD, normal pulmonary pressure.  Patient was last seen by Kenneth [name] in October 2019, a repeat echocardiogram in September 2019 showed EF of 55 to 60%, grade 1 DD, mild to moderate aortic insufficiency, mild RA dilatation.  Patient presents today for cardiology office visit.  He denies any exertional chest pain or significant shortness of breath.  He does have chronic dyspnea on exertion with more extreme activity but not with any everyday activity.  He had a abdominal CT that was obtained a few months ago at outside facility, this revealed coronary artery calcification.  When he was seen by his primary care provider Dr. Lenis [name] on 09/26/2018, his blood pressure was 106/56.  He says his blood pressure is usually not very low.  He was asymptomatic that day.  After his tear duct surgery recently, he did have 2 episode of severe vertigo.  In both cases, his blood pressure was either normal or slightly high.  He does not  have any orthostatic dizziness.  I recommended continue on the current blood pressure medication and observe blood pressure for the next 2 weeks.  If his systolic blood pressure remain in the 100-110 range, I likely will cut back on his blood pressure medication.    Past Medical History:  Diagnosis Date  . Amaurosis fugax 09/22/2014  . Diastolic dysfunction    grade 1 by echo 4/11  . Dyslipidemia   . HTN (hypertension)   . Sleep apnea    on BiPap    Past Surgical History:  Procedure Laterality Date  . CATARACT EXTRACTION Bilateral   . CHOLECYSTECTOMY    . SEPTOPLASTY  2000  . TONSILLECTOMY    . TRANSURETHRAL RESECTION OF PROSTATE  1997    Current Medications: Outpatient Medications Prior to Visit  Medication Sig Dispense Refill  . allopurinol (ZYLOPRIM) 100 MG tablet Take 200 mg by mouth daily.     . Cholecalciferol (VITAMIN D-1000 MAX ST) 1000 UNITS tablet Take 1,000 Units by mouth daily.    . clopidogrel (PLAVIX) 75 MG tablet Take 1 tablet (75 mg total) by mouth daily. 90 tablet 2  . diclofenac sodium (VOLTAREN) 1 % GEL Apply 1 application topically as needed.    . diltiazem (CARDIZEM CD) 120 MG 24 hr capsule TAKE 1 CAPSULE(120 MG) BY MOUTH DAILY 90 capsule 2  . ferrous sulfate 325 (65 FE) MG EC tablet Take 325 mg by mouth daily.    . fish  oil-omega-3 fatty acids 1000 MG capsule Take 1,200 mg by mouth daily.     Marland Kitchen lisinopril (PRINIVIL,ZESTRIL) 40 MG tablet Take 1 tablet (40 mg total) by mouth daily. 90 tablet 2  . loratadine (CLARITIN) 10 MG tablet Take 10 mg by mouth daily.    . Melatonin 3 MG TABS Take 1 tablet by mouth at bedtime as needed.    . metoprolol tartrate (LOPRESSOR) 25 MG tablet TAKE 1/2 TO 1 TABLET BY MOUTH AS NEEDED FOR PALPITATIONS 30 tablet 11  . Nebivolol HCl (BYSTOLIC) 20 MG TABS Take 1 tablet (20 mg total) by mouth daily. 90 tablet 2  . rosuvastatin (CRESTOR) 20 MG tablet Take 1 tablet (20 mg total) by mouth daily. 90 tablet 2  . spironolactone (ALDACTONE)  25 MG tablet Take 0.5 tablets (12.5 mg total) by mouth daily. K+ sparing diuretic: Hyperkalemia may occur with decreased renal function 45 tablet 2  . tamsulosin (FLOMAX) 0.4 MG CAPS Take 0.4 mg by mouth daily after supper.    . testosterone cypionate (DEPOTESTOSTERONE CYPIONATE) 200 MG/ML injection Inject 400 mg into the muscle every 30 (thirty) days.    Marland Kitchen zolpidem (AMBIEN) 5 MG tablet Take 1 tablet (5 mg total) by mouth at bedtime as needed. for sleep 30 tablet 2   No facility-administered medications prior to visit.      Allergies:   Patient has no known allergies.   Social History   Socioeconomic History  . Marital status: Single    Spouse name: Fannie Knee  . Number of children: 3  . Years of education: MD  . Highest education level: Not on file  Occupational History  . Occupation: Retired  Engineer, production  . Financial resource strain: Not on file  . Food insecurity:    Worry: Not on file    Inability: Not on file  . Transportation needs:    Medical: Not on file    Non-medical: Not on file  Tobacco Use  . Smoking status: Former Games developer  . Smokeless tobacco: Never Used  Substance and Sexual Activity  . Alcohol use: No  . Drug use: No  . Sexual activity: Not on file  Lifestyle  . Physical activity:    Days per week: Not on file    Minutes per session: Not on file  . Stress: Not on file  Relationships  . Social connections:    Talks on phone: Not on file    Gets together: Not on file    Attends religious service: Not on file    Active member of club or organization: Not on file    Attends meetings of clubs or organizations: Not on file    Relationship status: Not on file  Other Topics Concern  . Not on file  Social History Narrative   Lives at home w/ his wife   Patient is right handed.   Patient drinks 2 cups caffeine daily.     Family History:  The patient's family history includes Alzheimer's disease (age of onset: 76) in his mother; Emphysema in his father.   ROS:    Please see the history of present illness.    ROS All other systems reviewed and are negative.   PHYSICAL EXAM:   VS:  BP 121/60   Pulse (!) 57   Ht 6' (1.829 m)   Wt 193 lb (87.5 kg)   BMI 26.18 kg/m    GEN: Well nourished, well developed, in no acute distress  HEENT: normal  Neck: no JVD,  carotid bruits, or masses Cardiac: RRR; no murmurs, rubs, or gallops,no edema  Respiratory:  clear to auscultation bilaterally, normal work of breathing GI: soft, nontender, nondistended, + BS MS: no deformity or atrophy  Skin: warm and dry, no rash Neuro:  Alert and Oriented x 3, Strength and sensation are intact Psych: euthymic mood, full affect  Wt Readings from Last 3 Encounters:  10/01/18 193 lb (87.5 kg)  06/13/18 194 lb 6.4 oz (88.2 kg)  01/21/18 199 lb (90.3 kg)      Studies/Labs Reviewed:   EKG:  EKG is ordered today.  The ekg ordered today demonstrates normal sinus rhythm without significant ST-T wave changes  Recent Labs: No results found for requested labs within last 8760 hours.   Lipid Panel    Component Value Date/Time   CHOL 83 12/18/2016 0803   TRIG 93 12/18/2016 0803   HDL 28 (L) 12/18/2016 0803   CHOLHDL 3.0 12/18/2016 0803   VLDL 19 12/18/2016 0803   LDLCALC 36 12/18/2016 0803    Additional studies/ records that were reviewed today include:   Echo 04/16/2018 LV EF: 55% -   60% Study Conclusions  - Left ventricle: The cavity size was normal. Systolic function was   normal. The estimated ejection fraction was in the range of 55%   to 60%. Wall motion was normal; there were no regional wall   motion abnormalities. Doppler parameters are consistent with   abnormal left ventricular relaxation (grade 1 diastolic   dysfunction). - Aortic valve: There was mild to moderate regurgitation. - Right atrium: The atrium was mildly dilated.     ASSESSMENT:    1. Coronary artery disease due to calcified coronary lesion   2. Essential hypertension   3.  Hyperlipidemia LDL goal <70   4. OSA (obstructive sleep apnea)      PLAN:  In order of problems listed above:  1. Coronary artery calcification: Seen on previous abdominal CT.  Patient has been asymptomatic.  Continue Plavix monotherapy.  2. Hypertension: Blood pressure stable on current therapy, lisinopril, metoprolol and the spironolactone  3. Hyperlipidemia: On Crestor 20 mg daily.  Will defer annual lipid panel to primary care provider  4. Obstructive sleep apnea: Continue CPAP.    Medication Adjustments/Labs and Tests Ordered: Current medicines are reviewed at length with the patient today.  Concerns regarding medicines are outlined above.  Medication changes, Labs and Tests ordered today are listed in the Patient Instructions below. Patient Instructions  Medication Instructions:   Your physician recommends that you continue on your current medications as directed. Please refer to the Current Medication list given to you today.  If you need a refill on your cardiac medications before your next appointment, please call your pharmacy.   Lab work:  NONE  If you have labs (blood work) drawn today and your tests are completely normal, you will receive your results only by: Marland Kitchen MyChart Message (if you have MyChart) OR . A paper copy in the mail If you have any lab test that is abnormal or we need to change your treatment, we will call you to review the results.  Testing/Procedures:  NONE  Follow-Up: . Your physician recommends that you keep your scheduled follow-up appointment in April with Dr. Tresa Endo   Any Other Special Instructions Will Be Listed Below (If Applicable).  In 2 weeks send a message with your Blood Pressure readings through your MyChart account.       Ramond Dial, [place]  10/03/2018 11:12  PM    [facility] [address], [place], [place]  [address] Phone: [phone]; Fax: [phone]

## 2018-10-01 NOTE — Patient Instructions (Signed)
Medication Instructions:   Your physician recommends that you continue on your current medications as directed. Please refer to the Current Medication list given to you today.  If you need a refill on your cardiac medications before your next appointment, please call your pharmacy.   Lab work:  NONE  If you have labs (blood work) drawn today and your tests are completely normal, you will receive your results only by: Marland Kitchen MyChart Message (if you have MyChart) OR . A paper copy in the mail If you have any lab test that is abnormal or we need to change your treatment, we will call you to review the results.  Testing/Procedures:  NONE  Follow-Up: . Your physician recommends that you keep your scheduled follow-up appointment in April with Dr. Tresa Endo   Any Other Special Instructions Will Be Listed Below (If Applicable).  In 2 weeks send a message with your Blood Pressure readings through your MyChart account.

## 2018-10-03 ENCOUNTER — Encounter: Payer: Self-pay | Admitting: Physician Assistant

## 2018-12-10 ENCOUNTER — Telehealth: Payer: Self-pay | Admitting: Cardiovascular Disease

## 2018-12-10 NOTE — Telephone Encounter (Signed)
Smartphonee/ consent/ my chart/ pre reg completed

## 2018-12-11 ENCOUNTER — Telehealth (INDEPENDENT_AMBULATORY_CARE_PROVIDER_SITE_OTHER): Payer: Medicare Other | Admitting: Cardiovascular Disease

## 2018-12-11 ENCOUNTER — Encounter: Payer: Self-pay | Admitting: Cardiovascular Disease

## 2018-12-11 ENCOUNTER — Telehealth: Payer: Medicare Other | Admitting: Cardiovascular Disease

## 2018-12-11 VITALS — BP 122/56 | HR 53 | Ht 72.0 in | Wt 185.0 lb

## 2018-12-11 DIAGNOSIS — E785 Hyperlipidemia, unspecified: Secondary | ICD-10-CM | POA: Diagnosis not present

## 2018-12-11 DIAGNOSIS — I2584 Coronary atherosclerosis due to calcified coronary lesion: Secondary | ICD-10-CM

## 2018-12-11 DIAGNOSIS — I10 Essential (primary) hypertension: Secondary | ICD-10-CM

## 2018-12-11 DIAGNOSIS — I519 Heart disease, unspecified: Secondary | ICD-10-CM

## 2018-12-11 DIAGNOSIS — G4733 Obstructive sleep apnea (adult) (pediatric): Secondary | ICD-10-CM

## 2018-12-11 DIAGNOSIS — I5189 Other ill-defined heart diseases: Secondary | ICD-10-CM

## 2018-12-11 DIAGNOSIS — I351 Nonrheumatic aortic (valve) insufficiency: Secondary | ICD-10-CM

## 2018-12-11 DIAGNOSIS — I2510 Atherosclerotic heart disease of native coronary artery without angina pectoris: Secondary | ICD-10-CM

## 2018-12-11 MED ORDER — NEBIVOLOL HCL 20 MG PO TABS: 10.0000 mg | ORAL_TABLET | Freq: Every day | ORAL | 2 refills | Status: DC

## 2018-12-11 NOTE — Progress Notes (Signed)
Virtual Visit via Telephone Note   This visit type was conducted due to national recommendations for restrictions regarding the COVID-19 Pandemic (e.g. social distancing) in an effort to limit this patient's exposure and mitigate transmission in our community.  Due to his co-morbid illnesses, this patient is at least at moderate risk for complications without adequate follow up.  This format is felt to be most appropriate for this patient at this time.  The patient did not have access to video technology/had technical difficulties with video requiring transitioning to audio format only (telephone).  All issues noted in this document were discussed and addressed.  No physical exam could be performed with this format.  Please refer to the patient's chart for his  consent to telehealth for [facility].   Evaluation Performed:  Follow-up visit  Date:  12/11/2018   ID:  Kenneth [name], DOB Nov 27, 1935, MRN [id number]  Patient Location: Home Provider Location: Home  PCP:  Lenis Dickinson Marrian Salvage, MD  Cardiologist:  Nicki Guadalajara, MD  Electrophysiologist:  None   Chief Complaint: 59-month follow-up cardiology and sleep evaluation  History of Present Illness:    Kenneth [name] is a 83 y.o. male who  has a history of hypertension, hyperlipidemia, gout, and is status post TURP surgery by [name].  He has a residual underactive bladder and as result straight caths himself one time every day.  He states his post void residuals are in the 250-300 cc range.  He also has  bilateral renal cysts. He has a history of documented moderate obstructive sleep apnea with an AHI of 22.7 and an RDI of 33 and has been on BiPAP Auto therapy with an EPAP minimum of 10, IPAP maximum potential up to 25 with a delta of for which he started in July 2012. 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  Recently, he has had issues with very dry mouth.  He uses a nasal mask.  He has had intermittent use  of a chin strap.    An echo Doppler study in 05/2013 revealed normal left ventricle wall thickness with normal systolic and diastolic function. He did have mild aortic regurgitation due to poor central leaflet coaptation. There was borderline prolapse of his anterior mitral valve leaflet with trivial mitral regurgitation. He had normal pulmonary pressures assessment artery systolic pressure 19 mm.  He has had issues in the past with peripheral edema, which had led to an increased dose of his torsemide last year.  He denies exertionally precipitated chest pain.  He did experience one episode of some chest fullness or night when he was sitting on his bed, but this ultimately resolved, and he has not had any further sensation He is unaware of palpitations.  He denies presyncope or syncope.  In December 2015, he complained of experiencing transient episodes of a field defect in the lateral left eye.  These were both self-limiting.  He saw an ophthalmologist who recommended that he notify me concerning the symptoms  His history is notable in that he did sustain to prior head injuries many years ago.  Once when he was in [name] and had a severe concussion.  Another time occurred when he fell out of a tree stand when he was hunting.  I subtotal referred him to [facility] neurology.  Due to my concern for possible amaurosis fugax.  He saw 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  He underwent an MRI of the brain that showed mild small vessel ischemic changes in the MRA of the head and carotid Doppler studies which were unremarkable.  He was taken off aspirin and started on Plavix 75 mg.  He denies any recurrent symptomatology.  His primary physician is Dr. Glennon [name] in [place].   He has a history of hyperlipidemia and is tolerating Crestor 20 mg.  He has been on amlodipine 5 mg, Bystolic 20 mg, and lisinopril 40 mg for hypertension.   He was found to have increasing PSA levels with his levels rising from 1.5  to 7 and saw [name] for biopsy. He underwent cholecystectomy and at that time his ECG showed a PR interval at 212 ms, which was of concern by the anesthesiologist.  He tolerated surgery well without cardiovascular compromise.   In 2017 he had noticed a change in development of shortness of breath with less activity.  He denied associated chest tightness and was experiencing shortness of breath with walking less than 100 yards.  This has been ongoing for several months.  He had noticed some trace swelling in his ankles.  A nuclear stress test which was done on 04/11/2016.  Ejection fraction was 59%.  There were no ECG changes.  There was normal perfusion.  An echo Doppler study on 04/12/2016 showed mild LVH with normal systolic function with very mild grade 1 diastolic dysfunction.  There was mild aortic insufficiency and trivial TR and PR.  Pulmonary pressures were normal.  From a sleep perspective, he continues to use his BiPAP therapy.  He admits to his mouth getting dry during the day.  He has a nasal mask.  He may be having oral venting.  In the past.  He was given a chin strap but has not been consistently using this.  He has not had any recent download.  He believes his sleep initiation is good but he is having difficulty with sleep maintenance. He uses [employer] for his DME company.    He had experienced swelling in his lower extremity and a lower extremity Doppler study was negative for DVT in [place].  He was able to walk a mile without significant abnormalities, but when he has to carry his garbage can to the curb he notes shortness of breath.    Since his office visit in April 2018, he has continued to be without chest pain.  There is no change in his previous shortness of breath.  I had attempted to initiate spironolactone with his diastolic dysfunction but apparently during my absence heat, cold, the  office and was concerned that he had not noticed any significant change.  He was advised to go back and take one half of his previous dose of torsemide and one half of spironolactone.  He has been doing this and actually he has felt somewhat better.  He continues to take lisinopril 40 mg, Bystolic 20 mg, and amlodipine for hypertension.  He denies significant swelling.  He is on Crestor for hyperlipidemia.   When I saw him in October 2018, he had received a new BiPAP machine, ResMed air curve 10 feet auto.  His set up date was 07/06/2017.  His minimum EPAP pressure is 9 with a maximum IPAP pressure of 25.  In the office today.  I obtained a download from 08/12/2017 through 09/10/2017.  He was 100% compliant with usage stays and usage greater than 4 hours was 97%.  He is averaging  almost 6 hours and 30 minutes of sleep per night.  AHI is excellent at 1.2 and his 95th percentile pressure was 14.7/10.8 with a maximum average pressure of 15.8/11.8.  He has noticed significant improvement with his new machine.  He is sleeping well.  When I last saw him he had noticed rare palpitations which he describes as a chest flutter, which may last up to 30 seconds.  He has noticed this rarely every 2-3 months and typically there is an abrupt onset and abrupt cessation. Marland Kitchen.  He denies presyncope or syncope.  He denies associated chest tightness.  At that office visit, I discontinued amlodipine and change this to Cardizem CD 120 mg at night and also gave a prescription from a told metoprolol tartrate to take 12.5 to 25 mg as needed.  I also discussed improved sleep duration at all possible.  I last saw him in June 2019.  However his major complaint is that of fatigue well as exertional dyspnea.  He denies any leg swelling.  He denies chest pain.  Is continued to be on Bystolic 20 mg, diltiazem CD 120 daily, lisinopril 40 mg daily, in addition to low-dose spironolactone 12.5 mg and torsemide just 5 mg.  Continues to use BiPAP  and a download from Dec 22, 2017 through January 20, 2018 shows 100% compliance.  However, sleep duration was only 5 hours and 45 minutes.  95th percentile pressure was 15/11 with a maximum pressure 16/12.  AHI is excellent at 1.0.    When I last saw him in October 2019 he continued to experience fatigability.  He was continued to have issues with diarrhea and some abdominal pain and has been seeing [facility]ey at [facility].  He was to undergo an abdominal vascular MRI.  He denied any chest tightness or pressure.  He was continuing to use BiPAP typically goes to bed between 10 and 1030 and wakes up around 6:30 AM.  A download in July 2019 showed 100% usage with an AHI of 4.5 he continues to experience some fatigue.  He has been evaluated [facility] with pain and diarrhea.  He tells me he will be undergoing an abdominal vascular MRI.  He also is status post endoscopy.  He denies chest pain.  At times he notes shortness of breath particularly if he has to [place] b but a subsequent download in September 2019 showed increased AHI at 9.9 and was having some significant mask issues.  Presently, he has been on diltiazem 120 mg daily, lisinopril 40 mg, Bystolic 10 mg, in addition to Spironolactone 12.5 mg.  He also is on rosuvastatin 20 mg for hyperlipidemia.  His primary physician has been checking laboratory.  Remotely his LDL cholesterols have consistently been excellent and less than 60.  Since I last saw him, he was evaluated by Azalee CourseHao Meng, PAC in February 2020.  Of note, he had undergone an abdominal CT and this had demonstrated mild coronary artery calcification.  He was seen by his primary care provider Dr. Glennon HamiltonHoller early February 2020 and his blood pressure was 106/56.  He was feeling well and was asymptomatic.  A download was obtained today of his BiPAP unit from March 30 through December 10, 2018.  Usage was 100%.  However, daily usage declined to only 3 hours and 14 minutes and only 10 out of 30 days with  usage greater than 4 hours.  He apparently has a full facemask.  He never received the air fit F  30 little I that we had recommended but has a new full facemask covering his nose.  On his most recent download he did not have any significant leak.  He is set at an EPAP minimum pressure of 9 with an IPAP maximum pressure of 25 and a pressure support of 4.  AHI, however is elevated at 20.3 with an apnea index of 20.1.  95th percentile pressure was 22.1/18.1.  Typically he has been taking the mask off after he goes to the bathroom sometime during the night and typically has not put it on consistently.  He also has had some issues with dry mouth.  He presents for evaluation.  The patient does not have symptoms concerning for COVID-19 infection (fever, chills, cough, or new shortness of breath).    Past Medical History:  Diagnosis Date   Amaurosis fugax 09/22/2014   Diastolic dysfunction    grade 1 by echo 4/11   Dyslipidemia    HTN (hypertension)    Sleep apnea    on BiPap   Past Surgical History:  Procedure Laterality Date   CATARACT EXTRACTION Bilateral    CHOLECYSTECTOMY     SEPTOPLASTY  2000   TONSILLECTOMY     TRANSURETHRAL RESECTION OF PROSTATE  1997     Current Meds  Medication Sig   allopurinol (ZYLOPRIM) 100 MG tablet Take 200 mg by mouth daily.    Cholecalciferol (VITAMIN D-1000 MAX ST) 1000 UNITS tablet Take 1,000 Units by mouth daily.   clopidogrel (PLAVIX) 75 MG tablet Take 1 tablet (75 mg total) by mouth daily.   diclofenac sodium (VOLTAREN) 1 % GEL Apply 1 application topically as needed.   diltiazem (CARDIZEM CD) 120 MG 24 hr capsule TAKE 1 CAPSULE(120 MG) BY MOUTH DAILY   diphenoxylate-atropine (LOMOTIL) 2.5-0.025 MG tablet Take by mouth 2 (two) times a day.    ferrous sulfate 325 (65 FE) MG EC tablet Take 325 mg by mouth daily.   fish oil-omega-3 fatty acids 1000 MG capsule Take 1,200 mg by mouth daily.    lisinopril (PRINIVIL,ZESTRIL) 40 MG tablet Take  1 tablet (40 mg total) by mouth daily.   loratadine (CLARITIN) 10 MG tablet Take 10 mg by mouth daily.   Melatonin 3 MG TABS Take 1 tablet by mouth at bedtime as needed.   metoprolol tartrate (LOPRESSOR) 25 MG tablet TAKE 1/2 TO 1 TABLET BY MOUTH AS NEEDED FOR PALPITATIONS   Nebivolol HCl (BYSTOLIC) 20 MG TABS Take 0.5 tablets (10 mg total) by mouth daily.   rosuvastatin (CRESTOR) 20 MG tablet Take 1 tablet (20 mg total) by mouth daily.   spironolactone (ALDACTONE) 25 MG tablet Take 0.5 tablets (12.5 mg total) by mouth daily. K+ sparing diuretic: Hyperkalemia may occur with decreased renal function   tamsulosin (FLOMAX) 0.4 MG CAPS Take 0.4 mg by mouth daily after supper.   testosterone cypionate (DEPOTESTOSTERONE CYPIONATE) 200 MG/ML injection Inject 400 mg into the muscle every 30 (thirty) days.   Zinc 30 MG TABS Take by mouth.   zolpidem (AMBIEN) 5 MG tablet Take 1 tablet (5 mg total) by mouth at bedtime as needed. for sleep   [DISCONTINUED] Nebivolol HCl (BYSTOLIC) 20 MG TABS Take 1 tablet (20 mg total) by mouth daily.     Allergies:   Patient has no known allergies.   Social History   Tobacco Use   Smoking status: Former Smoker   Smokeless tobacco: Never Used  Substance Use Topics   Alcohol use: No   Drug use: No  Family Hx: The patient's family history includes Alzheimer's disease (age of onset: 50) in his mother; Emphysema in his father.  ROS:   Please see the history of present illness.    No fever chills or night sweats.  History of prior episodes of left lateral eye defect Occasional shortness of breath activity. No chest pain Continued issues with abdominal discomfort and diarrhea Male hypogonadism Remote history of amaurosis fugax Unaware of residual snoring mild fatigability.  Not using CPAP for the entire night duration, dry mouth  All other systems reviewed and are negative.   Prior CV studies:   The following studies were reviewed  today:  Echo 04/16/2018 LV EF: 55% - 60% Study Conclusions  - Left ventricle: The cavity size was normal. Systolic function was normal. The estimated ejection fraction was in the range of 55% to 60%. Wall motion was normal; there were no regional wall motion abnormalities. Doppler parameters are consistent with abnormal left ventricular relaxation (grade 1 diastolic dysfunction). - Aortic valve: There was mild to moderate regurgitation. - Right atrium: The atrium was mildly dilated.    Labs/Other Tests and Data Reviewed:    EKG:  An ECG dated 06/13/2018 was personally reviewed today and demonstrated:   Sinus bradycardia with first-degree AV block.  PR interval 214 ms.  No ST segment changes.  No ectopy.  Recent Labs: No results found for requested labs within last 8760 hours.   Recent Lipid Panel Lab Results  Component Value Date/Time   CHOL 83 12/18/2016 08:03 AM   TRIG 93 12/18/2016 08:03 AM   HDL 28 (L) 12/18/2016 08:03 AM   CHOLHDL 3.0 12/18/2016 08:03 AM   LDLCALC 36 12/18/2016 08:03 AM    Wt Readings from Last 3 Encounters:  12/11/18 185 lb (83.9 kg)  10/01/18 193 lb (87.5 kg)  06/13/18 194 lb 6.4 oz (88.2 kg)     Objective:    Vital Signs:  BP (!) 122/56    Pulse (!) 53    Ht 6' (1.829 m)    Wt 185 lb (83.9 kg)    BMI 25.09 kg/m    Initially this was scheduled as a video conference, but the patient had difficulty in opening up the video component. He denies any significant change in general appearance His breathing was normal and not labored I did not hear any audible wheezing He denied any discomfort to his chest with palpation He continues to experience abdominal discomfort and has had issues with persistent diarrhea undergoing continued GI evaluations He admits to fatigue He denies any new visual symptoms and since he has been on Plavix He denies restless legs.   ASSESSMENT & PLAN:    1. Essential hypertension: Blood pressure today  continues to be controlled on his medical regimen now consisting of lisinopril, Bystolic, diltiazem, and spironolactone. 2. Sleep apnea: Patient has significant sleep apnea requiring high pressure BiPAP therapy.  I extensively reviewed his most recent download with him in detail.  Although he is 100 percent compliant with with BiPAP usage, he is not compliant with usage greater than 4 hours.  On his most recent download usage was only 3 hours and 14 minutes with only 33% of the nights with greater than 4 hours use.  His unit has been set with an EPAP minimum of 9 and IPAP maximum of 25.  95 percentile pressure is 22.1/18.1.  His AHI is elevated at 20.3/h with an apnea index of 20.1/h.  He has been set at a pressure support  of 4.  I have recommended adjustment to his modality.  I will increase his EPAP minimum to 11 and I will further increase his pressure support to 5 such that his initial pressure will be 16/11 with potential titration up to 25/20.  I again discussed the importance of using CPAP throughout the night entirety.  Specifically, the preponderance of rem sleep occurs in the second half of the night and when he has been taking off his mask after coming back from the bathroom he has not been having treatment during the greatest likelihood of obtaining REM sleep in the second half of the night.  He admits to some residual daytime sleepiness and fatigability.  At present he does not have a significant mask leak.  He continues to use a full facemask over the nose.  [employer] is his DME company. 3. Exertional dyspnea.  He has documented grade 1 diastolic dysfunction.  Over the winter months he does not routinely exercise.  He is now been walking 2 days/week.  Some of this may be a combination of diastolic dysfunction as well as aerobic deconditioning.  He denies any chest tightness or pressure. 4. Mild to moderate aortic insufficiency noted on his most recent echo. 5. Abdominal discomfort with diarrhea,  followed by Dr. Opal Sidles at [place]. 6. Coronary calcification noted on CT.  He continues to be on rosuvastatin which should be beneficial in potential reduction in plaque progression and induction any regression with his typical LDL cholesterol 60 or below  COVID-19 Education: The signs and symptoms of COVID-19 were discussed with the patient and how to seek care for testing (follow up with PCP or arrange E-visit).  The importance of social distancing was discussed today.  Time:   Today, I have spent 30 minutes with the patient with telehealth technology discussing the above problems.     Medication Adjustments/Labs and Tests Ordered: Current medicines are reviewed at length with the patient today.  Concerns regarding medicines are outlined above.   Tests Ordered: No orders of the defined types were placed in this encounter.   Medication Changes: Meds ordered this encounter  Medications   Nebivolol HCl (BYSTOLIC) 20 MG TABS    Sig: Take 0.5 tablets (10 mg total) by mouth daily.    Dispense:  90 tablet    Refill:  2    Disposition:  Follow up: a download will be rechecked in 4 to 6 weeks.  I will see him in 6 months for face-to-face reevaluation   Signed, Nicki Guadalajara, MD  12/11/2018 10:36 AM    [facility]

## 2018-12-11 NOTE — Patient Instructions (Signed)
Medication Instructions:  The current medical regimen is effective;  continue present plan and medications.  If you need a refill on your cardiac medications before your next appointment, please call your pharmacy.  Follow-Up: At [facility], you and your health needs are our priority.  As part of our continuing mission to provide you with exceptional heart care, we have created designated Provider Care Teams.  These Care Teams include your primary Cardiologist (physician) and Advanced Practice Providers (APPs -  Physician Assistants and Nurse Practitioners) who all work together to provide you with the care you need, when you need it. You will need a follow up appointment in 6 months.  Please call our office 2 months in advance to schedule this appointment.  You may see Nicki Guadalajara, MD or one of the following Advanced Practice Providers on your designated Care Team: [place], [place] . Micah Flesher, PA-C  Any Other Special Instructions Will Be Listed Below (If Applicable). Changed setting to BiPAP- sent already. Will obtain download in 4-6 weeks.

## 2019-04-16 ENCOUNTER — Other Ambulatory Visit: Payer: Self-pay | Admitting: Cardiovascular Disease

## 2019-04-16 NOTE — Telephone Encounter (Signed)
Last filled in April by Dr. Claiborne Billings. Ok to fill?

## 2019-04-18 ENCOUNTER — Other Ambulatory Visit: Payer: Self-pay | Admitting: Cardiovascular Disease

## 2019-04-30 ENCOUNTER — Other Ambulatory Visit: Payer: Self-pay | Admitting: Cardiovascular Disease

## 2019-05-31 ENCOUNTER — Other Ambulatory Visit: Payer: Self-pay | Admitting: Cardiovascular Disease

## 2019-06-12 ENCOUNTER — Ambulatory Visit: Payer: Medicare Other | Admitting: Cardiovascular Disease

## 2019-08-07 ENCOUNTER — Other Ambulatory Visit: Payer: Self-pay

## 2019-08-07 ENCOUNTER — Ambulatory Visit (INDEPENDENT_AMBULATORY_CARE_PROVIDER_SITE_OTHER): Payer: Medicare Other | Admitting: Cardiovascular Disease

## 2019-08-07 ENCOUNTER — Encounter: Payer: Self-pay | Admitting: Cardiovascular Disease

## 2019-08-07 DIAGNOSIS — I351 Nonrheumatic aortic (valve) insufficiency: Secondary | ICD-10-CM

## 2019-08-07 DIAGNOSIS — I2510 Atherosclerotic heart disease of native coronary artery without angina pectoris: Secondary | ICD-10-CM | POA: Diagnosis not present

## 2019-08-07 DIAGNOSIS — I5189 Other ill-defined heart diseases: Secondary | ICD-10-CM

## 2019-08-07 DIAGNOSIS — G4733 Obstructive sleep apnea (adult) (pediatric): Secondary | ICD-10-CM

## 2019-08-07 DIAGNOSIS — I10 Essential (primary) hypertension: Secondary | ICD-10-CM | POA: Diagnosis not present

## 2019-08-07 DIAGNOSIS — I519 Heart disease, unspecified: Secondary | ICD-10-CM | POA: Diagnosis not present

## 2019-08-07 DIAGNOSIS — I313 Pericardial effusion (noninflammatory): Secondary | ICD-10-CM

## 2019-08-07 DIAGNOSIS — E785 Hyperlipidemia, unspecified: Secondary | ICD-10-CM

## 2019-08-07 DIAGNOSIS — I3139 Other pericardial effusion (noninflammatory): Secondary | ICD-10-CM

## 2019-08-07 NOTE — Patient Instructions (Signed)
Medication Instructions:  Your physician recommends that you continue on your current medications as directed. Please refer to the Current Medication list given to you today.  *If you need a refill on your cardiac medications before your next appointment, please call your pharmacy*  Lab Work: NONE If you have labs (blood work) drawn today and your tests are completely normal, you will receive your results only by: Marland Kitchen MyChart Message (if you have MyChart) OR . A paper copy in the mail If you have any lab test that is abnormal or we need to change your treatment, we will call you to review the results.  Testing/Procedures: Your physician has requested that you have an echocardiogram. Echocardiography is a painless test that uses sound waves to create images of your heart. It provides your doctor with information about the size and shape of your heart and how well your heart's chambers and valves are working. This procedure takes approximately one hour. There are no restrictions for this procedure.LOCATION: [place] [facility]: [address] [place], [place], [place] [address]    Follow-Up: At [facility], you and your health needs are our priority.  As part of our continuing mission to provide you with exceptional heart care, we have created designated Provider Care Teams.  These Care Teams include your primary Cardiologist (physician) and Advanced Practice Providers (APPs -  Physician Assistants and Nurse Practitioners) who all work together to provide you with the care you need, when you need it.  Your next appointment:   6 month(s) UNLESS YOUR ECHOCARDIOGRAM RESULTS ARE ABNORMAL   The format for your next appointment:   In Person  Provider:   Shelva Majestic, MD  Other Instructions

## 2019-08-07 NOTE — Progress Notes (Signed)
Patient ID: Kenneth [name], male   DOB: 1935/08/21, 83 y.o.   MRN: [id number]     PCP: [name] at [facility] in [place]  HPI: Kenneth [name] is a 83 y.o. male who presents for a 8 month f/u sleep/cardiology evaluation.  Mr. Kenneth [name] has a history of hypertension, hyperlipidemia, gout, and is status post TURP surgery by Dr. [name].  He has a residual underactive bladder and as result straight caths himself one time every day.  He states his post void residuals are in the 250-300 cc range.  He also has  bilateral renal cysts. He has a history of documented moderate obstructive sleep apnea with an AHI of 22.7 and an RDI of 33 and has been on BiPAP Auto therapy with an EPAP minimum of 10, IPAP maximum potential up to 25 with a delta of for which he started in July 2012. 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  Recently, he has had issues with very dry mouth.  He uses a nasal mask.  He has had intermittent use of a chin strap.    An echo Doppler study in 05/2013 revealed normal left ventricle wall thickness with normal systolic and diastolic function. He did have mild aortic regurgitation due to poor central leaflet coaptation. There was borderline prolapse of his anterior mitral valve leaflet with trivial mitral regurgitation. He had normal pulmonary pressures assessment artery systolic pressure 19 mm.  He has had issues in the past with peripheral edema, which had led to an increased dose of his torsemide last year.  He denies exertionally precipitated chest pain.  He did experience one episode of some chest fullness or night when he was sitting on his bed, but this ultimately resolved, and he has not had any further sensation He is unaware of palpitations.  He denies presyncope or syncope.  In December 2015, he complained of experiencing transient episodes of a field defect in the lateral left eye.  These were both self-limiting.  He saw an ophthalmologist  who recommended that he notify me concerning the symptoms  His history is notable in that he did sustain to prior head injuries many years ago.  Once when he was in high school and had a severe concussion.  Another time occurred when he fell out of a tree stand when he was hunting.  I subtotal referred him to [facility] neurology.  Due to my concern for possible amaurosis fugax.  He saw Dr. [name], who felt that the patient had experienced at least 3 episodes of amaurosis fugax in his left eye.  He underwent an MRI of the brain that showed mild small vessel ischemic changes in the MRA of the head and carotid Doppler studies which were unremarkable.  He was taken off aspirin and started on Plavix 75 mg.  He denies any recurrent symptomatology.  His primary physician is Dr. [name] in [place].   He has a history of hyperlipidemia and is tolerating Crestor 20 mg.  He has been on amlodipine 5 mg, Bystolic 20 mg, and lisinopril 40 mg for hypertension.  He was found to have increasing PSA levels with his levels rising from 1.5  to 7 and saw Dr. [name] for biopsy. He underwent cholecystectomy and at that time his ECG showed a PR interval at 212 ms, which was of concern by the anesthesiologist.  He tolerated surgery well without cardiovascular compromise.   In 2017 he had noticed a change in development  of shortness of breath with less activity.  He denied associated chest tightness and was experiencing shortness of breath with walking less than 100 yards.  This has been ongoing for several months.  He had noticed some trace swelling in his ankles.  A nuclear stress test which was done on 04/11/2016.  Ejection fraction was 59%.  There were no ECG changes.  There was normal perfusion.  An echo Doppler study on 04/12/2016 showed mild LVH with normal systolic function with very mild grade 1 diastolic dysfunction.  There was mild aortic insufficiency and trivial TR and PR.  Pulmonary pressures were  normal.  From a sleep perspective, he continues to use his BiPAP therapy.  He admits to his mouth getting dry during the day.  He has a nasal mask.  He may be having oral venting.  In the past.  He was given a chin strap but has not been consistently using this.  He has not had any recent download.  He believes his sleep initiation is good but he is having difficulty with sleep maintenance. He uses [employer] for his DME company.    He had experienced swelling in his lower extremity and a lower extremity Doppler study was negative for DVT in [place].  He was able to walk a mile without significant abnormalities, but when he has to carry his garbage can to the curb he notes shortness of breath.    Since his office visit in April 2018, he has continued to be without chest pain.  There is no change in his previous shortness of breath.  I had attempted to initiate spironolactone with his diastolic dysfunction but apparently during my absence heat, cold, the office and was concerned that he had not noticed any significant change.  He was advised to go back and take one half of his previous dose of torsemide and one half of spironolactone.  He has been doing this and actually he has felt somewhat better.  He continues to take lisinopril 40 mg, Bystolic 20 mg, and amlodipine for hypertension.  He denies significant swelling.  He is on Crestor for hyperlipidemia.   When I saw him in October 2018, he had received a new BiPAP machine, ResMed air curve 10 feet auto.  His set up date was 07/06/2017.  His minimum EPAP pressure is 9 with a maximum IPAP pressure of 25.  In the office today.  I obtained a download from 08/12/2017 through 09/10/2017.  He was 100% compliant with usage stays and usage greater than 4 hours was 97%.  He is averaging almost 6 hours and 30 minutes of sleep per night.  AHI is excellent at 1.2 and his 95th percentile pressure was 14.7/10.8 with a maximum average pressure of 15.8/11.8.  He has  noticed significant improvement with his new machine.  He is sleeping well.  When I saw him in January 2019 he was experiencing rare palpitations which he described as a chest flutter, which may last up to 30 seconds.  He has noticed this rarely every 2-3 months and typically there is an abrupt onset and abrupt cessation. Marland Kitchen  He denies presyncope or syncope.  He denies associated chest tightness.  At that office visit, I discontinued amlodipine and change this to Cardizem CD 120 mg at night and also gave a prescription from a told metoprolol tartrate to take 12.5 to 25 mg as needed.  I also discussed improved sleep duration at all possible.  At his evaluation in June 2019  his major complaint is that of fatigue well as exertional dyspnea.  He denied chest pain, swelling or awareness of palpitations.  He continued to be on Bystolic 20 mg, diltiazem CD 120 daily, lisinopril 40 mg daily, in addition to low-dose spironolactone 12.5 mg and torsemide just 5 mg.  Continues to use BiPAP and a download from Dec 22, 2017 through January 20, 2018 shows 100% compliance.  However, sleep duration was only 5 hours and 45 minutes.  95th percentile pressure was 15/11 with a maximum pressure 16/12.  AHI is excellent at 1.0.    I last evaluated him on December 11, 2018 and a telemedicine visit.  His blood pressure was controlled.  I reviewed a recent download which revealed 100% compliance with BiPAP usage however he was not using therapy for long enough duration and was only averaging 3 hours and 14 minutes.  His AHI was elevated at 20.3 on an EPAP minimum of 9 and IPAP maximum of 25.  I increase his EPAP minimum to 11 and further increased his pressure support to 5.  He was walking several days per week.  His echo study had previously demonstrated grade 1 diastolic dysfunction.  Coronary CT had noticed coronary calcification and for this reason I continued him on rosuvastatin at 0 mg attempt to induce LDL reduction to the 50s or  below.  As only, he feels well.  He had undergone laboratory by his primary provider and LDL cholesterol was excellent at 37.  Primary provider question whether or not his dose of rosuvastatin should be decreased to 20 mg down to 10 mg..  He has been taking rosuvastatin 20 mg daily  When I  saw him in October 2019 he continued to experience fatigability.  He was continued to have issues with diarrhea and some abdominal pain and has been seeing Dr. Christoper Fabian at [facility].  He was to undergo an abdominal vascular MRI.  He denied any chest tightness or pressure.  He was continuing to use BiPAP typically goes to bed between 10 and 1030 and wakes up around 6:30 AM.  A download in July 2019 showed 100% usage with an AHI of 4.5 he continues to experience some fatigue. He has been evaluated by Dr. Christoper Fabian at [facility] with pain and diarrhea. He tells me he will be undergoing an abdominal vascular MRI. He also is status post endoscopy. He denies chest pain. At times he notes shortness of breath particularly if he has to [place] b but a subsequent download in September 2019 showed increased AHI at 9.9 and was having some significant mask issues.  Presently, he has been on diltiazem 120 mg daily, lisinopril 40 mg, Bystolic 10 mg, in addition to Spironolactone 12.5 mg.  He also is on rosuvastatin 20 mg for hyperlipidemia.  His primary physician has been checking laboratory.  Remotely his LDL cholesterols have consistently been excellent and less than 60.  He was evaluated by Almyra Deforest, PAC in February 2020.  Of note, he had undergone an abdominal CT and this had demonstrated mild coronary artery calcification.  He was seen by his primary care provider Dr. [name] early February 2020 and his blood pressure was 106/56.  He was feeling well and was asymptomatic.  I last evaluated him in a telemedicine visit on December 11, 2018.  A download was obtained  of his  BiPAP unit from March 30 through December 10, 2018.  Usage was 100%.   However, daily usage declined to only 3 hours and  14 minutes and only 10 out of 30 days with usage greater than 4 hours.  He apparently has a full facemask.  He never received the air fit F 30 little I that we had recommended but has a new full facemask covering his nose.  On his most recent download he did not have any significant leak.  He is set at an EPAP minimum pressure of 9 with an IPAP maximum pressure of 25 and a pressure support of 4.  AHI, however is elevated at 20.3 with an apnea index of 20.1.  95th percentile pressure was 22.1/18.1.  Typically he has been taking the mask off after he goes to the bathroom sometime during the night and typically has not put it on consistently.  He also has had some issues with dry mouth.    During that evaluation I adjusted his BiPAP therapy and increased EPAP minimum to 11 and increase pressure support to 5 such that his initial pressure will be 16/11 with potential titration up to 25/20.  Gust the importance of improved sleep duration and using CPAP for the preponderance of the night.  Since I last evaluated him, he has seen his primary physician, Dr. [name].  Laboratory recently showed an LDL cholesterol at 37 and his primary physician was wondering if his rosuvastatin dose should be reduced 20 down to 10 mg.  Prior CT did demonstrate coronary calcification as well as suggested a possible small pericardial effusion.  He has continued to use BiPAP and a new download from July 08, 2019 through August 06, 2019 shows 100% usage and usage greater than 4 hours at 73%.  Average usage on days used was 4 hours and 43 minutes.  He has issues with dry mouth.  AHI was improved from previously but still elevated at 10.3.  His 95th percentile pressure was 22.4/17.4 with a maximum average pressure 23.4/18.5.  He continues to see Dr. [name] for his neurologic issues.  There is strong family history of prostate CA and the patient requires self cathing on a daily basis.  He  denies chest pain.  Is admit to a vague chest sensation if he bends over.  He denies any chest pain or shortness of breath with cutting his grass with a walking lawnmower.   Past Medical History:  Diagnosis Date  . Amaurosis fugax 09/22/2014  . Diastolic dysfunction    grade 1 by echo 4/11  . Dyslipidemia   . HTN (hypertension)   . Sleep apnea    on BiPap    Past Surgical History:  Procedure Laterality Date  . CATARACT EXTRACTION Bilateral   . CHOLECYSTECTOMY    . SEPTOPLASTY  2000  . TONSILLECTOMY    . TRANSURETHRAL RESECTION OF PROSTATE  1997    No Known Allergies  Current Outpatient Medications  Medication Sig Dispense Refill  . allopurinol (ZYLOPRIM) 100 MG tablet Take 200 mg by mouth daily.     Marland Kitchen BYSTOLIC 20 MG TABS TAKE 1 TABLET BY MOUTH  DAILY (Patient taking differently: Take half a tablet daily.) 90 tablet 3  . Cholecalciferol (VITAMIN D-1000 MAX ST) 1000 UNITS tablet Take 1,000 Units by mouth daily.    . clopidogrel (PLAVIX) 75 MG tablet Take 1 tablet (75 mg total) by mouth daily. 90 tablet 2  . diclofenac sodium (VOLTAREN) 1 % GEL Apply 1 application topically as needed.    . diltiazem (CARDIZEM CD) 120 MG 24 hr capsule TAKE 1 CAPSULE(120 MG) BY MOUTH DAILY 90 capsule 2  .  diphenoxylate-atropine (LOMOTIL) 2.5-0.025 MG tablet Take by mouth 2 (two) times a day.     . ferrous sulfate 325 (65 FE) MG EC tablet Take 325 mg by mouth daily.    . fish oil-omega-3 fatty acids 1000 MG capsule Take 1,200 mg by mouth daily.     Marland Kitchen lisinopril (ZESTRIL) 40 MG tablet TAKE 1 TABLET BY MOUTH  DAILY 90 tablet 3  . loratadine (CLARITIN) 10 MG tablet Take 10 mg by mouth daily.    . Melatonin 3 MG TABS Take 1 tablet by mouth at bedtime as needed.    . metoprolol tartrate (LOPRESSOR) 25 MG tablet TAKE 1/2 TO 1 TABLET BY MOUTH AS NEEDED FOR PALPITATIONS 30 tablet 11  . rosuvastatin (CRESTOR) 20 MG tablet TAKE 1 TABLET BY MOUTH  DAILY 90 tablet 2  . spironolactone (ALDACTONE) 25 MG tablet  Take 0.5 tablets (12.5 mg total) by mouth daily. K+ sparing diuretic: Hyperkalemia may occur with decreased renal function 45 tablet 2  . tamsulosin (FLOMAX) 0.4 MG CAPS Take 0.4 mg by mouth daily after supper.    . testosterone cypionate (DEPOTESTOSTERONE CYPIONATE) 200 MG/ML injection Inject 400 mg into the muscle every 30 (thirty) days.    Marland Kitchen ZENPEP 40000-126000 units CPEP Take 1 capsule by mouth daily.    . Zinc 30 MG TABS Take by mouth.    . zolpidem (AMBIEN) 5 MG tablet TAKE 1 TABLET BY MOUTH AT  BEDTIME AS NEEDED FOR SLEEP 30 tablet 2   No current facility-administered medications for this visit.    Social History   Socioeconomic History  . Marital status: Single    Spouse name: Collie Siad  . Number of children: 3  . Years of education: MD  . Highest education level: Not on file  Occupational History  . Occupation: Retired  Tobacco Use  . Smoking status: Former Research scientist (life sciences)  . Smokeless tobacco: Never Used  Substance and Sexual Activity  . Alcohol use: No  . Drug use: No  . Sexual activity: Not on file  Other Topics Concern  . Not on file  Social History Narrative   Lives at home w/ his wife   Patient is right handed.   Patient drinks 2 cups caffeine daily.   Social Determinants of Health   Financial Resource Strain:   . Difficulty of Paying Living Expenses: Not on file  Food Insecurity:   . Worried About Charity fundraiser in the Last Year: Not on file  . Ran Out of Food in the Last Year: Not on file  Transportation Needs:   . Lack of Transportation (Medical): Not on file  . Lack of Transportation (Non-Medical): Not on file  Physical Activity:   . Days of Exercise per Week: Not on file  . Minutes of Exercise per Session: Not on file  Stress:   . Feeling of Stress : Not on file  Social Connections:   . Frequency of Communication with Friends and Family: Not on file  . Frequency of Social Gatherings with Friends and Family: Not on file  . Attends Religious Services: Not  on file  . Active Member of Clubs or Organizations: Not on file  . Attends Archivist Meetings: Not on file  . Marital Status: Not on file  Intimate Partner Violence:   . Fear of Current or Ex-Partner: Not on file  . Emotionally Abused: Not on file  . Physically Abused: Not on file  . Sexually Abused: Not on file  Family History  Problem Relation Age of Onset  . Alzheimer's disease Mother 73  . Emphysema Father    Socially he is married and has 3 children, 14 grandchildren and 3 great-grandchildren. There is no tobacco or alcohol use.  ROS General: Negative; No fevers, chills, or night sweats;  HEENT: Positive for 2 transient episodes over the past year with left lateral eye field defect, sinus congestion, difficulty swallowing Pulmonary: Negative; No cough, wheezing, shortness of breath, hemoptysis Cardiovascular: See history of present illness GI: Negative; No nausea, vomiting, diarrhea, or abdominal pain GU: Negative; No dysuria, hematuria, or difficulty voiding Musculoskeletal: Positive for history of gout Hematologic/Oncology: Negative; no easy bruising, bleeding Endocrine: Negative; no heat/cold intolerance; no diabetes Neuro: Positive for h/o amaurosis fugax; resolved since Plavix was instituted Skin: Negative; No rashes or skin lesions Psychiatric: Negative; No behavioral problems, depression Sleep: Positive for sleep apnea.  With BiPAP therapy he is unaware of any breakthrough snoring, daytime sleepiness, hypersomnolence, bruxism, restless legs, hypnogognic hallucinations, no cataplexy Other comprehensive 14 point system review is negative.   PE BP 122/69   Pulse 63   Temp (!) 96.8 F (36 C)   Ht '6\' 1"'$  (1.854 m)   Wt 186 lb (84.4 kg)   SpO2 97%   BMI 24.54 kg/m    Repeat blood pressure by me was 124/70.  He states he has lost 10 pounds in 6 to 8 months  Wt Readings from Last 3 Encounters:  08/07/19 186 lb (84.4 kg)  12/11/18 185 lb (83.9 kg)   10/01/18 193 lb (87.5 kg)   General: Alert, oriented, no distress.  Skin: normal turgor, no rashes, warm and dry HEENT: Normocephalic, atraumatic. Pupils equal round and reactive to light; sclera anicteric; extraocular muscles intact;  Nose without nasal septal hypertrophy Mouth/Parynx benign; Mallinpatti scale 3 Neck: No JVD, no carotid bruits; normal carotid upstroke Lungs: clear to ausculatation and percussion; no wheezing or rales Chest wall: without tenderness to palpitation Heart: PMI not displaced, RRR, s1 s2 normal, 1/6 systolic murmur, no diastolic murmur, no rubs, gallops, thrills, or heaves Abdomen: soft, nontender; no hepatosplenomehaly, BS+; abdominal aorta nontender and not dilated by palpation. Back: no CVA tenderness Pulses 2+ Musculoskeletal: full range of motion, normal strength, no joint deformities Extremities: no clubbing cyanosis or edema, Homan's sign negative  Neurologic: grossly nonfocal; Cranial nerves grossly wnl Psychologic: Normal mood and affect  ECG (independently read by me): Sinus rhythm at 63 bpm with first-degree AV block with a parable to 18 ms.  No ectopy.  October 2019 ECG (independently read by me): Sinus bradycardia with first-degree AV block.  PR interval 214 ms.  No ST segment changes.  No ectopy.  June 2019 ECG (independently read by me): Sinus bradycardia at 50 bpm.  First-degree AV block.  No significant ST segment changes.  January 2019 ECG (independently read by me): Sinus bradycardia 59 bpm.  Borderline first degree AV block with a PR interval 202 ms; QTC normal at 394 ms.  No ectopy.  April 2018 ECG (independently read by me): Normal sinus rhythm at 60, normal intervals.  No ST segment changes  October 2017 ECG (independently read by me): Sinus bradycardia at 57 bpm.  PR interval 200 ms.  QTc interval 395 ms.  July 2017 ECG (independently read by me): Normal sinus rhythm at 61 bpm.  Normal QTc with borderline first-degree AV  block  November 2016 ECG (independently read by me): Normal sinus rhythm at 60 bpm.  PR interval 200 ms.  June 2016 ECG (independently read by me): Sinus rhythm at 63.  Intervals normal.  No ST segment changes  December 2015 ECG (independently read by me): Sinus bradycardia 59 bpm.  Mild first-degree AV block with a PR interval 212 ms.  QTc interval normal.  No ST segment changes.  June 2015 ECG (independently read by me): sinus rhythm at 59 beats per minute.  Borderline first degree AV block with a PR interval of 204 ms.  ECG: Sinus rhythm at 61 beats per minute. Borderline first degree AV block with PR interval 200 ms. QT interval normal 388 ms.  LABS: BMP Latest Ref Rng & Units 12/18/2016 03/20/2016 07/19/2015  Glucose 65 - 99 mg/dL 95 97 91  BUN 7 - 25 mg/dL 12 20 26(H)  Creatinine 0.70 - 1.18 mg/dL 1.05 1.22(H) 1.19(H)  Sodium 135 - 146 mmol/L 142 143 142  Potassium 3.5 - 5.3 mmol/L 4.1 4.2 4.4  Chloride 98 - 110 mmol/L 110 104 103  CO2 20 - 31 mmol/L '25 31 30  '$ Calcium 8.6 - 10.3 mg/dL 8.5(L) 9.0 9.1   Hepatic Function Latest Ref Rng & Units 12/18/2016 03/20/2016 07/19/2015  Total Protein 6.1 - 8.1 g/dL 6.1 6.8 6.8  Albumin 3.6 - 5.1 g/dL 3.6 3.9 3.8  AST 10 - 35 U/L '15 19 30  '$ ALT 9 - 46 U/L 14 17 38  Alk Phosphatase 40 - 115 U/L 46 56 61  Total Bilirubin 0.2 - 1.2 mg/dL 0.8 0.6 1.0   CBC Latest Ref Rng & Units 12/18/2016 03/20/2016 07/19/2015  WBC 3.8 - 10.8 K/uL 6.2 6.3 6.0  Hemoglobin 13.2 - 17.1 g/dL 13.0(L) 14.9 14.6  Hematocrit 38.5 - 50.0 % 39.5 44.6 42.3  Platelets 140 - 400 K/uL 149 155 150   Lab Results  Component Value Date   MCV 96.8 12/18/2016   MCV 95.1 03/20/2016   MCV 93.6 07/19/2015   Lab Results  Component Value Date   TSH 1.76 12/18/2016  No results found for: HGBA1C   Lipid Panel      Component Value Date/Time   CHOL 83 12/18/2016 0803   TRIG 93 12/18/2016 0803   HDL 28 (L) 12/18/2016 0803   CHOLHDL 3.0 12/18/2016 0803   VLDL 19 12/18/2016 0803    LDLCALC 36 12/18/2016 0803     RADIOLOGY: No results found.  IMPRESSION:  1. Essential hypertension   2. Grade I diastolic dysfunction   3. Coronary artery calcification seen on CAT scan   4. Hyperlipidemia LDL goal <70   5. OSA (obstructive sleep apnea) on BiPAP   6. Nonrheumatic aortic valve insufficiency   7. Pericardial effusion     ASSESSMENT AND PLAN: Mr. Basil Buffin is a 83 year-old gentleman who has a history of hypertension, hyperlipidemia, intermittent peripheral edema, obstructive sleep apnea, currently on BiPAP, as well as remote history of gout. He has documented bilateral renal cysts and is status post TURP.He has an increased postvoid residual and straight caths himself one time every day.  He continues to Dr. [name] for urologic follow-up.  Blood pressure today is stable on his current regimen consisting of Bystolic 20 mg, diltiazem 120 mg, lisinopril 40 mg, and spironolactone 12.5 mg.  He has a prescription for metoprolol tartrate to take as needed for palpitations.  He has been documented to have coronary calcification on CT and for this reason I have been very aggressive with lipid management.  Most recent LDL cholesterol checked several months ago by his Dr. [name] showed  an LDL cholesterol of 37.  I recommended he continue his current dose of rosuvastatin 20 mg and not reduce this since at this level there is a high likelihood of having significant plaque regression and prevention of plaque progression.  His CT scan also raised the possibility of a small pericardial effusion.  His last echo in September 2019 showed an EF of 55 to 99%, grade 1 diastolic dysfunction, mild to moderate aortic insufficiency, and mild RA dilation.   I have recommended he undergo an echo Doppler assessment for further evaluation.  Palpitations have been controlled.  He continues to use BiPAP.  Compliance is improved from his previous evaluation.  I again discussed the importance of increased sleep  duration and specifically that preponderance of REM sleep occurs in the second half at night.  He has had issues with dry mouth and particularly during the winter months I have suggested he increase his humidification level.  His most recent download shows an AHI of 10.3 with a minimum EPAP pressure of 14 and maximum IPAP pressure of 25.  His 95th percentile pressure, is 22.4/17.4.  I am recommending further increase of minimum EPAP pressure to 16 such that his initial pressure with a 5 cm pressure support will be 21/16.  A repeat download will be obtained in 4 to 6 weeks.  I will see him in 6 months for cardiology reevaluation.  Time spent: 25 minutes  Troy Sine, MD, [facility]  08/07/2019 2:40 PM

## 2019-08-23 ENCOUNTER — Ambulatory Visit: Payer: Medicare Other | Attending: Internal Medicine

## 2019-08-23 DIAGNOSIS — Z23 Encounter for immunization: Secondary | ICD-10-CM

## 2019-08-23 NOTE — Progress Notes (Signed)
   Covid-19 Vaccination Clinic  Name:  Kenneth [name]    MRN: [id number] DOB: [birth year]  08/23/2019  [name] was observed post Covid-19 immunization for 30 minutes based on pre-vaccination screening without incidence. He was provided with Vaccine Information Sheet and instruction to access the V-Safe system.   [name] was instructed to call 911 with any severe reactions post vaccine: Marland Kitchen Difficulty breathing  . Swelling of your face and throat  . A fast heartbeat  . A bad rash all over your body  . Dizziness and weakness    Immunizations Administered    Name Date Dose VIS Date Route   Pfizer COVID-19 Vaccine 08/23/2019 12:17 PM 0.3 mL 07/25/2019 Intramuscular   Manufacturer: [employer], [employer]   Lot: [id number]   NDC: [address]-3

## 2019-08-26 ENCOUNTER — Ambulatory Visit (HOSPITAL_COMMUNITY): Payer: Medicare Other | Attending: Cardiology

## 2019-08-26 ENCOUNTER — Other Ambulatory Visit: Payer: Self-pay

## 2019-08-26 DIAGNOSIS — I2510 Atherosclerotic heart disease of native coronary artery without angina pectoris: Secondary | ICD-10-CM | POA: Diagnosis present

## 2019-08-26 DIAGNOSIS — I519 Heart disease, unspecified: Secondary | ICD-10-CM | POA: Insufficient documentation

## 2019-08-26 DIAGNOSIS — I5189 Other ill-defined heart diseases: Secondary | ICD-10-CM

## 2019-09-13 ENCOUNTER — Ambulatory Visit: Payer: Medicare Other | Attending: Internal Medicine

## 2019-09-13 DIAGNOSIS — Z23 Encounter for immunization: Secondary | ICD-10-CM | POA: Insufficient documentation

## 2019-09-13 NOTE — Progress Notes (Signed)
   Covid-19 Vaccination Clinic  Name:  Kenneth [name]    MRN: [id number] DOB: [birth year]  09/13/2019  Kenneth [name] was observed post Covid-19 immunization for 15 minutes without incidence. He was provided with Vaccine Information Sheet and instruction to access the V-Safe system.   Kenneth [name] was instructed to call 911 with any severe reactions post vaccine: Marland Kitchen Difficulty breathing  . Swelling of your face and throat  . A fast heartbeat  . A bad rash all over your body  . Dizziness and weakness    Immunizations Administered    Name Date Dose VIS Date Route   Pfizer COVID-19 Vaccine 09/13/2019 10:15 AM 0.3 mL 07/25/2019 Intramuscular   Manufacturer: [employer], [employer]   Lot: [id number]   NDC: [address]-9

## 2019-09-16 ENCOUNTER — Encounter: Payer: Self-pay | Admitting: Cardiovascular Disease

## 2019-10-08 ENCOUNTER — Other Ambulatory Visit: Payer: Self-pay | Admitting: Cardiovascular Disease

## 2019-10-09 NOTE — Telephone Encounter (Signed)
Rx has been sent to the pharmacy electronically. ° °

## 2019-10-15 ENCOUNTER — Telehealth: Payer: Self-pay | Admitting: *Deleted

## 2019-10-15 NOTE — Telephone Encounter (Signed)
Patient notified per Dr Tresa Endo current download shows AHI 7.5 with some centrals. Changes made to device. Patient also informed per TK he needs to increase his sleep duration time with the machine. Patient states that he does try ,however his mouth gets really dry. Water chamber is almost dry in the morning. Climate control was changed to auto mode. Patient advised that if this does not help to call back to let us know. We will try something else.

## 2019-11-05 ENCOUNTER — Telehealth: Payer: Self-pay

## 2019-11-05 MED ORDER — BISOPROLOL FUMARATE 10 MG PO TABS: 10.0000 mg | ORAL_TABLET | Freq: Every day | ORAL | 3 refills | Status: DC

## 2019-11-05 NOTE — Telephone Encounter (Signed)
Called and spoke with pt. Notified that his PA for Bystolic 20mg  had been denied and that Dr.Kelly wanted him to try Bisoprolol 10mg  daily. Pt verbalized understanding. He stated that he is supposed to have a follow up with dr.Kelly 6 months from his last appt in December of 2020. I notified that Dr.Kellys schedule for Velinda Wrobel was completely full but that when our July calendar came out we would notify him for an in office visit. Pt agreeable.   Prescription sent in to pts preferred pharmacy.

## 2019-11-28 ENCOUNTER — Other Ambulatory Visit: Payer: Self-pay | Admitting: Cardiovascular Disease

## 2019-11-30 ENCOUNTER — Other Ambulatory Visit: Payer: Self-pay | Admitting: Cardiovascular Disease

## 2019-12-18 ENCOUNTER — Other Ambulatory Visit: Payer: Self-pay

## 2019-12-18 MED ORDER — BYSTOLIC 20 MG PO TABS: 1.0000 | ORAL_TABLET | Freq: Every day | ORAL | 1 refills | Status: DC

## 2019-12-30 ENCOUNTER — Other Ambulatory Visit: Payer: Self-pay

## 2020-02-23 ENCOUNTER — Other Ambulatory Visit: Payer: Self-pay | Admitting: Cardiovascular Disease

## 2020-03-02 ENCOUNTER — Other Ambulatory Visit: Payer: Self-pay

## 2020-03-02 ENCOUNTER — Ambulatory Visit (INDEPENDENT_AMBULATORY_CARE_PROVIDER_SITE_OTHER): Payer: Medicare Other | Admitting: Cardiovascular Disease

## 2020-03-02 ENCOUNTER — Encounter: Payer: Self-pay | Admitting: Cardiovascular Disease

## 2020-03-02 VITALS — BP 112/70 | HR 52 | Ht 72.0 in | Wt 185.6 lb

## 2020-03-02 DIAGNOSIS — I10 Essential (primary) hypertension: Secondary | ICD-10-CM | POA: Diagnosis not present

## 2020-03-02 DIAGNOSIS — I2510 Atherosclerotic heart disease of native coronary artery without angina pectoris: Secondary | ICD-10-CM

## 2020-03-02 DIAGNOSIS — E785 Hyperlipidemia, unspecified: Secondary | ICD-10-CM | POA: Diagnosis not present

## 2020-03-02 DIAGNOSIS — I519 Heart disease, unspecified: Secondary | ICD-10-CM

## 2020-03-02 DIAGNOSIS — I5189 Other ill-defined heart diseases: Secondary | ICD-10-CM

## 2020-03-02 MED ORDER — SPIRONOLACTONE 25 MG PO TABS: ORAL_TABLET | ORAL | 3 refills | Status: DC

## 2020-03-02 MED ORDER — METOPROLOL TARTRATE 25 MG PO TABS: ORAL_TABLET | ORAL | 9 refills | Status: DC

## 2020-03-02 NOTE — Patient Instructions (Addendum)
Medication Instructions:  INCREASE YOUR SPIRONOLACTONE- ALTERNATING 12.5MG  WITH 25MG  EVERY OTHER DAY  TRY TO STOP TAKING THE AMBIEN, CHANGE TO MELATONIN (OVER THE COUNTER, CAN  TRY 5MG )  *If you need a refill on your cardiac medications before your next appointment, please call your pharmacy*     Follow-Up: At [facility], you and your health needs are our priority.  As part of our continuing mission to provide you with exceptional heart care, we have created designated Provider Care Teams.  These Care Teams include your primary Cardiologist (physician) and Advanced Practice Providers (APPs -  Physician Assistants and Nurse Practitioners) who all work together to provide you with the care you need, when you need it.  We recommend signing up for the patient portal called "MyChart".  Sign up information is provided on this After Visit Summary.  MyChart is used to connect with patients for Virtual Visits (Telemedicine).  Patients are able to view lab/test results, encounter notes, upcoming appointments, etc.  Non-urgent messages can be sent to your provider as well.   To learn more about what you can do with MyChart, go to .    Your next appointment:   6 month(s)  The format for your next appointment:   In Person  Provider:   [facility] MD   CHANGES TO YOUR MACHINE MADE TODAY  PRIOR TO YOUR INJECTION- TAKE YOUR LAST DOSE OF YOUR PLAVIX ON 03/17/20- YOUR PROVIDER CAN LET YOU KNOW WHEN TO START AGAIN AFTER YOUR PROCEDURE.

## 2020-03-02 NOTE — Progress Notes (Signed)
Patient ID: Con Arganbright, male   DOB: [birth year], 84 y.o.   MRN: [id number]     PCP: [name] at [facility] in [place]  HPI: Sahir Tolson is a 84 y.o. male who presents for a 7 month f/u sleep/cardiology evaluation.  Mr. Goytia has a history of hypertension, hyperlipidemia, gout, and is status post TURP surgery by Dr. [name].  He has a residual underactive bladder and as result straight caths himself one time every day.  He states his post void residuals are in the 250-300 cc range.  He also has  bilateral renal cysts. He has a history of documented moderate obstructive sleep apnea with an AHI of 22.7 and an RDI of 33 and has been on BiPAP Auto therapy with an EPAP minimum of 10, IPAP maximum potential up to 25 with a delta of for which he started in July 2012. 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  Recently, he has had issues with very dry mouth.  He uses a nasal mask.  He has had intermittent use of a chin strap.    An echo Doppler study in 05/2013 revealed normal left ventricle wall thickness with normal systolic and diastolic function. He did have mild aortic regurgitation due to poor central leaflet coaptation. There was borderline prolapse of his anterior mitral valve leaflet with trivial mitral regurgitation. He had normal pulmonary pressures assessment artery systolic pressure 19 mm.  He has had issues in the past with peripheral edema, which had led to an increased dose of his torsemide last year.  He denies exertionally precipitated chest pain.  He did experience one episode of some chest fullness or night when he was sitting on his bed, but this ultimately resolved, and he has not had any further sensation He is unaware of palpitations.  He denies presyncope or syncope.  In December 2015, he complained of experiencing transient episodes of a field defect in the lateral left eye.  These were both self-limiting.  He saw an ophthalmologist  who recommended that he notify me concerning the symptoms  His history is notable in that he did sustain to prior head injuries many years ago.  Once when he was in high school and had a severe concussion.  Another time occurred when he fell out of a tree stand when he was hunting.  I subtotal referred him to [facility] neurology.  Due to my concern for possible amaurosis fugax.  He saw Dr. [name], who felt that the patient had experienced at least 3 episodes of amaurosis fugax in his left eye.  He underwent an MRI of the brain that showed mild small vessel ischemic changes in the MRA of the head and carotid Doppler studies which were unremarkable.  He was taken off aspirin and started on Plavix 75 mg.  He denies any recurrent symptomatology.  His primary physician is Dr. [name] in [place].   He has a history of hyperlipidemia and is tolerating Crestor 20 mg.  He has been on amlodipine 5 mg, Bystolic 20 mg, and lisinopril 40 mg for hypertension.  He was found to have increasing PSA levels with his levels rising from 1.5  to 7 and saw Dr. [name] for biopsy. He underwent cholecystectomy and at that time his ECG showed a PR interval at 212 ms, which was of concern by the anesthesiologist.  He tolerated surgery well without cardiovascular compromise.   In 2017 he had noticed a change in development  of shortness of breath with less activity.  He denied associated chest tightness and was experiencing shortness of breath with walking less than 100 yards.  This has been ongoing for several months.  He had noticed some trace swelling in his ankles.  A nuclear stress test which was done on 04/11/2016.  Ejection fraction was 59%.  There were no ECG changes.  There was normal perfusion.  An echo Doppler study on 04/12/2016 showed mild LVH with normal systolic function with very mild grade 1 diastolic dysfunction.  There was mild aortic insufficiency and trivial TR and PR.  Pulmonary pressures were  normal.  From a sleep perspective, he continues to use his BiPAP therapy.  He admits to his mouth getting dry during the day.  He has a nasal mask.  He may be having oral venting.  In the past.  He was given a chin strap but has not been consistently using this.  He has not had any recent download.  He believes his sleep initiation is good but he is having difficulty with sleep maintenance. He uses [employer] for his DME company.    He had experienced swelling in his lower extremity and a lower extremity Doppler study was negative for DVT in [place].  He was able to walk a mile without significant abnormalities, but when he has to carry his garbage can to the curb he notes shortness of breath.    Since his office visit in April 2018, he has continued to be without chest pain.  There is no change in his previous shortness of breath.  I had attempted to initiate spironolactone with his diastolic dysfunction but apparently during my absence heat, cold, the office and was concerned that he had not noticed any significant change.  He was advised to go back and take one half of his previous dose of torsemide and one half of spironolactone.  He has been doing this and actually he has felt somewhat better.  He continues to take lisinopril 40 mg, Bystolic 20 mg, and amlodipine for hypertension.  He denies significant swelling.  He is on Crestor for hyperlipidemia.   When I saw him in October 2018, he had received a new BiPAP machine, ResMed air curve 10 feet auto.  His set up date was 07/06/2017.  His minimum EPAP pressure is 9 with a maximum IPAP pressure of 25.  In the office today.  I obtained a download from 08/12/2017 through 09/10/2017.  He was 100% compliant with usage stays and usage greater than 4 hours was 97%.  He is averaging almost 6 hours and 30 minutes of sleep per night.  AHI is excellent at 1.2 and his 95th percentile pressure was 14.7/10.8 with a maximum average pressure of 15.8/11.8.  He has  noticed significant improvement with his new machine.  He is sleeping well.  When I saw him in January 2019 he was experiencing rare palpitations which he described as a chest flutter, which may last up to 30 seconds.  He has noticed this rarely every 2-3 months and typically there is an abrupt onset and abrupt cessation. Marland Kitchen  He denies presyncope or syncope.  He denies associated chest tightness.  At that office visit, I discontinued amlodipine and change this to Cardizem CD 120 mg at night and also gave a prescription from a told metoprolol tartrate to take 12.5 to 25 mg as needed.  I also discussed improved sleep duration at all possible.  At his evaluation in June 2019  his major complaint is that of fatigue well as exertional dyspnea.  He denied chest pain, swelling or awareness of palpitations.  He continued to be on Bystolic 20 mg, diltiazem CD 120 daily, lisinopril 40 mg daily, in addition to low-dose spironolactone 12.5 mg and torsemide just 5 mg.  Continues to use BiPAP and a download from Dec 22, 2017 through January 20, 2018 shows 100% compliance.  However, sleep duration was only 5 hours and 45 minutes.  95th percentile pressure was 15/11 with a maximum pressure 16/12.  AHI is excellent at 1.0.    I last evaluated him on December 11, 2018 and a telemedicine visit.  His blood pressure was controlled.  I reviewed a recent download which revealed 100% compliance with BiPAP usage however he was not using therapy for long enough duration and was only averaging 3 hours and 14 minutes.  His AHI was elevated at 20.3 on an EPAP minimum of 9 and IPAP maximum of 25.  I increase his EPAP minimum to 11 and further increased his pressure support to 5.  He was walking several days per week.  His echo study had previously demonstrated grade 1 diastolic dysfunction.  Coronary CT had noticed coronary calcification and for this reason I continued him on rosuvastatin at 0 mg attempt to induce LDL reduction to the 50s or  below.  He had undergone laboratory by his primary provider and LDL cholesterol was excellent at 37.  Primary provider question whether or not his dose of rosuvastatin should be decreased to 20 mg down to 10 mg..  He has been taking rosuvastatin 20 mg daily  When I  saw him in October 2019 he continued to experience fatigability.  He was continued to have issues with diarrhea and some abdominal pain and has been seeing Dr. Opal Sidles at [facility].  He was to undergo an abdominal vascular MRI.  He denied any chest tightness or pressure.  He was continuing to use BiPAP typically goes to bed between 10 and 1030 and wakes up around 6:30 AM.  A download in July 2019 showed 100% usage with an AHI of 4.5 he continues to experience some fatigue. He has been evaluated by Dr. Opal Sidles at [facility] with pain and diarrhea. He tells me he will be undergoing an abdominal vascular MRI. He also is status post endoscopy. He denies chest pain. At times he notes shortness of breath particularly if he has to [place] b but a subsequent download in September 2019 showed increased AHI at 9.9 and was having some significant mask issues.  Presently, he has been on diltiazem 120 mg daily, lisinopril 40 mg, Bystolic 10 mg, in addition to Spironolactone 12.5 mg.  He also is on rosuvastatin 20 mg for hyperlipidemia.  His primary physician has been checking laboratory.  Remotely his LDL cholesterols have consistently been excellent and less than 60.  He was evaluated by Azalee Course, PAC in February 2020.  Of note, he had undergone an abdominal CT and this had demonstrated mild coronary artery calcification.  He was seen by his primary care provider [name] early February 2020 and his blood pressure was 106/56.  He was feeling well and was asymptomatic.  I  evaluated him in a telemedicine visit on December 11, 2018.  A download was obtained  of his  BiPAP unit from March 30 through December 10, 2018.  Usage was 100%.  However, daily usage declined  to only 3 hours and 14 minutes and only 10 out  of 30 days with usage greater than 4 hours.  He apparently has a full facemask.  He never received the air fit F 30 little I that we had recommended but has a new full facemask covering his nose.  On his most recent download he did not have any significant leak.  He is set at an EPAP minimum pressure of 9 with an IPAP maximum pressure of 25 and a pressure support of 4.  AHI, however is elevated at 20.3 with an apnea index of 20.1.  95th percentile pressure was 22.1/18.1.  Typically he has been taking the mask off after he goes to the bathroom sometime during the night and typically has not put it on consistently.  He also has had some issues with dry mouth.    During that evaluation I adjusted his BiPAP therapy and increased EPAP minimum to 11 and increase pressure support to 5 such that his initial pressure will be 16/11 with potential titration up to 25/20.  Gust the importance of improved sleep duration and using CPAP for the preponderance of the night.  I last saw him on August 07, 2019 and since his prior evaluation he had seen Dr. [name], his primary physician who I checked laboratory. Laboratory  showed an LDL cholesterol at 37 and his primary physician was wondering if his rosuvastatin dose should be reduced 20 down to 10 mg.  Prior CT did demonstrate coronary calcification as well as suggested a possible small pericardial effusion.  He has continued to use BiPAP and a new download from July 08, 2019 through August 06, 2019 shows 100% usage and usage greater than 4 hours at 73%.  Average usage on days used was 4 hours and 43 minutes.  He has issues with dry mouth.  AHI was improved from previously but still elevated at 10.3.  His 95th percentile pressure was 22.4/17.4 with a maximum average pressure 23.4/18.5.  He continues to see Dr. [name] for his urologic issues.  There is strong family history of prostate CA and the patient requires self cathing on  a daily basis.  He denies chest pain.  Is admit to a vague chest sensation if he bends over.  He denies any chest pain or shortness of breath with cutting his grass with a walking lawnmower.  He underwent a follow-up echo Doppler study on August 26, 2019 which showed an EF of 50 to 55% and grade 1 diastolic dysfunction.  There was mild to moderate aortic valve sclerosis without stenosis and mild aortic insufficiency.  There is mild dilation of his aortic root measuring 40 mm.    Over the past 7 months, Mr. Sheerin has continued to do fairly well.  He has had issues with chronic pain and will need to undergo epidural injection scheduled tentatively for March 23, 2020.  He also is scheduled to undergo repeat TURP surgery with Dr. [name] on August 16.  He has noticed some intermittent ankle swelling despite taking spironolactone 12.5 mg daily.  He has continued to use BiPAP therapy.  He does have some issues with sleep maintenance.  Download was obtained in the office today from June 20 through March 01, 2020 which shows 100% compliance.  However he usage duration is only 4 hours and 29 minutes.  AHI is elevated at 11.4 with an apnea index of 11.1 and central index of 4.4.  His 95th percentile pressure is 23.4/17.4 with a maximum average pressure 24/18 cm of water.  He presents for evaluation.  Past Medical History:  Diagnosis Date  . Amaurosis fugax 09/22/2014  . Diastolic dysfunction    grade 1 by echo 4/11  . Dyslipidemia   . HTN (hypertension)   . Sleep apnea    on BiPap    Past Surgical History:  Procedure Laterality Date  . CATARACT EXTRACTION Bilateral   . CHOLECYSTECTOMY    . SEPTOPLASTY  2000  . TONSILLECTOMY    . TRANSURETHRAL RESECTION OF PROSTATE  1997    No Known Allergies  Current Outpatient Medications  Medication Sig Dispense Refill  . allopurinol (ZYLOPRIM) 100 MG tablet Take 200 mg by mouth daily.     . bisoprolol (ZEBETA) 10 MG tablet Take 1 tablet (10 mg total) by  mouth daily. 90 tablet 3  . Cholecalciferol (VITAMIN D-1000 MAX ST) 1000 UNITS tablet Take 1,000 Units by mouth daily.    . clopidogrel (PLAVIX) 75 MG tablet Take 1 tablet (75 mg total) by mouth daily. 90 tablet 2  . diclofenac sodium (VOLTAREN) 1 % GEL Apply 1 application topically as needed.    . diltiazem (CARDIZEM CD) 120 MG 24 hr capsule TAKE 1 CAPSULE(120 MG) BY MOUTH DAILY 90 capsule 2  . diphenoxylate-atropine (LOMOTIL) 2.5-0.025 MG tablet Take by mouth 2 (two) times a day.     . ferrous sulfate 325 (65 FE) MG EC tablet Take 325 mg by mouth daily.    . fish oil-omega-3 fatty acids 1000 MG capsule Take 1,200 mg by mouth daily.     Marland Kitchen lisinopril (ZESTRIL) 40 MG tablet TAKE 1 TABLET BY MOUTH  DAILY 90 tablet 3  . loratadine (CLARITIN) 10 MG tablet Take 10 mg by mouth daily.    . Melatonin 3 MG TABS Take 1 tablet by mouth at bedtime as needed.    . metoprolol tartrate (LOPRESSOR) 25 MG tablet TAKE 1/2 TO 1 TABLET BY MOUTH AS NEEDED FOR PALPITATIONS 30 tablet 9  . rosuvastatin (CRESTOR) 20 MG tablet TAKE 1 TABLET BY MOUTH  DAILY 90 tablet 3  . spironolactone (ALDACTONE) 25 MG tablet TAKE 12.'5MG'$  EVERY OTHER DAY ALTERNATING WITH '25MG'$  EVERY OTHER DAY 90 tablet 3  . tamsulosin (FLOMAX) 0.4 MG CAPS Take 0.4 mg by mouth daily after supper.    . testosterone cypionate (DEPOTESTOSTERONE CYPIONATE) 200 MG/ML injection Inject 400 mg into the muscle every 30 (thirty) days.    Marland Kitchen ZENPEP 40000-126000 units CPEP Take 1 capsule by mouth daily.    . Zinc 30 MG TABS Take by mouth.     No current facility-administered medications for this visit.    Social History   Socioeconomic History  . Marital status: Married    Spouse name: Collie Siad  . Number of children: 3  . Years of education: MD  . Highest education level: Not on file  Occupational History  . Occupation: Retired  Tobacco Use  . Smoking status: Former Research scientist (life sciences)  . Smokeless tobacco: Never Used  Substance and Sexual Activity  . Alcohol use: No  .  Drug use: No  . Sexual activity: Not on file  Other Topics Concern  . Not on file  Social History Narrative   Lives at home w/ his wife   Patient is right handed.   Patient drinks 2 cups caffeine daily.   Social Determinants of Health   Financial Resource Strain:   . Difficulty of Paying Living Expenses:   Food Insecurity:   . Worried About Charity fundraiser in the Last Year:   . [employer]  of Food in the Last Year:   Transportation Needs:   . Film/video editor (Medical):   Marland Kitchen Lack of Transportation (Non-Medical):   Physical Activity:   . Days of Exercise per Week:   . Minutes of Exercise per Session:   Stress:   . Feeling of Stress :   Social Connections:   . Frequency of Communication with Friends and Family:   . Frequency of Social Gatherings with Friends and Family:   . Attends Religious Services:   . Active Member of Clubs or Organizations:   . Attends Archivist Meetings:   Marland Kitchen Marital Status:   Intimate Partner Violence:   . Fear of Current or Ex-Partner:   . Emotionally Abused:   Marland Kitchen Physically Abused:   . Sexually Abused:     Family History  Problem Relation Age of Onset  . Alzheimer's disease Mother 64  . Emphysema Father    Socially he is married and has 3 children, 14 grandchildren and 3 great-grandchildren. There is no tobacco or alcohol use.  ROS General: Negative; No fevers, chills, or night sweats;  HEENT: Positive for 2 transient episodes over the past year with left lateral eye field defect, sinus congestion, difficulty swallowing Pulmonary: Negative; No cough, wheezing, shortness of breath, hemoptysis Cardiovascular: See history of present illness GI: Negative; No nausea, vomiting, diarrhea, or abdominal pain GU: Negative; No dysuria, hematuria, or difficulty voiding Musculoskeletal: Positive for history of gout Hematologic/Oncology: Negative; no easy bruising, bleeding Endocrine: Negative; no heat/cold intolerance; no diabetes Neuro:  Positive for h/o amaurosis fugax; resolved since Plavix was instituted Skin: Negative; No rashes or skin lesions Psychiatric: Negative; No behavioral problems, depression Sleep: Positive for sleep apnea.  With BiPAP therapy he is unaware of any breakthrough snoring, daytime sleepiness, hypersomnolence, bruxism, restless legs, hypnogognic hallucinations, no cataplexy Other comprehensive 14 point system review is negative.   PE BP 112/70 (BP Location: Right Arm, Patient Position: Sitting, Cuff Size: Normal)   Pulse (!) 52   Ht 6' (1.829 m)   Wt 185 lb 9.6 oz (84.2 kg)   BMI 25.17 kg/m    Repeat blood pressure by me was 138/66  Wt Readings from Last 3 Encounters:  03/02/20 185 lb 9.6 oz (84.2 kg)  08/07/19 186 lb (84.4 kg)  12/11/18 185 lb (83.9 kg)   General: Alert, oriented, no distress.  Skin: normal turgor, no rashes, warm and dry HEENT: Normocephalic, atraumatic. Pupils equal round and reactive to light; sclera anicteric; extraocular muscles intact;  Nose without nasal septal hypertrophy Mouth/Parynx benign; Mallinpatti scale 3 Neck: No JVD, no carotid bruits; normal carotid upstroke Lungs: clear to ausculatation and percussion; no wheezing or rales Chest wall: without tenderness to palpitation Heart: PMI not displaced, RRR, s1 s2 normal, 1/6 systolic murmur, no diastolic murmur, no rubs, gallops, thrills, or heaves Abdomen: soft, nontender; no hepatosplenomehaly, BS+; abdominal aorta nontender and not dilated by palpation. Back: no CVA tenderness Pulses 2+ Musculoskeletal: full range of motion, normal strength, no joint deformities Extremities: no clubbing cyanosis or edema, Homan's sign negative  Neurologic: grossly nonfocal; Cranial nerves grossly wnl Psychologic: Normal mood and affect  ECG (independently read by me): Sinus bradycardia 52 bpm.  No ectopy.  PR interval 206 ms  August 07, 2019 ECG (independently read by me): Sinus rhythm at 63 bpm with first-degree AV  block with a PR 218 ms.  No ectopy.  October 2019 ECG (independently read by me): Sinus bradycardia with first-degree AV block.  PR interval 214  ms.  No ST segment changes.  No ectopy.  June 2019 ECG (independently read by me): Sinus bradycardia at 50 bpm.  First-degree AV block.  No significant ST segment changes.  January 2019 ECG (independently read by me): Sinus bradycardia 59 bpm.  Borderline first degree AV block with a PR interval 202 ms; QTC normal at 394 ms.  No ectopy.  April 2018 ECG (independently read by me): Normal sinus rhythm at 60, normal intervals.  No ST segment changes  October 2017 ECG (independently read by me): Sinus bradycardia at 57 bpm.  PR interval 200 ms.  QTc interval 395 ms.  July 2017 ECG (independently read by me): Normal sinus rhythm at 61 bpm.  Normal QTc with borderline first-degree AV block  November 2016 ECG (independently read by me): Normal sinus rhythm at 60 bpm.  PR interval 200 ms.  June 2016 ECG (independently read by me): Sinus rhythm at 63.  Intervals normal.  No ST segment changes  December 2015 ECG (independently read by me): Sinus bradycardia 59 bpm.  Mild first-degree AV block with a PR interval 212 ms.  QTc interval normal.  No ST segment changes.  June 2015 ECG (independently read by me): sinus rhythm at 59 beats per minute.  Borderline first degree AV block with a PR interval of 204 ms.  ECG: Sinus rhythm at 61 beats per minute. Borderline first degree AV block with PR interval 200 ms. QT interval normal 388 ms.  LABS: BMP Latest Ref Rng & Units 12/18/2016 03/20/2016 07/19/2015  Glucose 65 - 99 mg/dL 95 97 91  BUN 7 - 25 mg/dL 12 20 26(H)  Creatinine 0.70 - 1.18 mg/dL 1.05 1.22(H) 1.19(H)  Sodium 135 - 146 mmol/L 142 143 142  Potassium 3.5 - 5.3 mmol/L 4.1 4.2 4.4  Chloride 98 - 110 mmol/L 110 104 103  CO2 20 - 31 mmol/L '25 31 30  '$ Calcium 8.6 - 10.3 mg/dL 8.5(L) 9.0 9.1   Hepatic Function Latest Ref Rng & Units 12/18/2016 03/20/2016  07/19/2015  Total Protein 6.1 - 8.1 g/dL 6.1 6.8 6.8  Albumin 3.6 - 5.1 g/dL 3.6 3.9 3.8  AST 10 - 35 U/L '15 19 30  '$ ALT 9 - 46 U/L 14 17 38  Alk Phosphatase 40 - 115 U/L 46 56 61  Total Bilirubin 0.2 - 1.2 mg/dL 0.8 0.6 1.0   CBC Latest Ref Rng & Units 12/18/2016 03/20/2016 07/19/2015  WBC 3.8 - 10.8 K/uL 6.2 6.3 6.0  Hemoglobin 13.2 - 17.1 g/dL 13.0(L) 14.9 14.6  Hematocrit 38 - 50 % 39.5 44.6 42.3  Platelets 140 - 400 K/uL 149 155 150   Lab Results  Component Value Date   MCV 96.8 12/18/2016   MCV 95.1 03/20/2016   MCV 93.6 07/19/2015   Lab Results  Component Value Date   TSH 1.76 12/18/2016  No results found for: HGBA1C   Lipid Panel      Component Value Date/Time   CHOL 83 12/18/2016 0803   TRIG 93 12/18/2016 0803   HDL 28 (L) 12/18/2016 0803   CHOLHDL 3.0 12/18/2016 0803   VLDL 19 12/18/2016 0803   LDLCALC 36 12/18/2016 0803     RADIOLOGY: No results found.  IMPRESSION:  1. Essential hypertension   2. Grade I diastolic dysfunction   3. Coronary artery calcification seen on CAT scan   4. Hyperlipidemia LDL goal <70     ASSESSMENT AND PLAN: Mr. Lathan Gieselman is a 84 year-old gentleman who has a history  of hypertension, hyperlipidemia, intermittent peripheral edema, obstructive sleep apnea, currently on BiPAP, as well as remote history of gout. He has documented bilateral renal cysts and is status post TURP.He has an increased postvoid residual and straight caths himself one time every day.  He sees Dr. [name] for urologic follow-up and due to recurrent issues is tentatively scheduled to undergo repeat TURP surgery in March 29, 2020.  Apparently he has had issues with L4 sciatica and is scheduled to undergo an epidural injection on March 23, 2020.  He has been on Plavix long-term following a remote stroke started by Dr. Jannifer Franklin.  I have recommended that his last dose of Plavix to be done taken on August 4 which will give him 5 days to be off Plavix prior to his epidural  injection.  He should also then remain off Plavix since his prostate surgery is scheduled 6 days later.  He has not had any neurologic symptoms on therapy.  Remotely he had experienced several episodes of amaurosis fugax in his left eye and an MRI of his brain showed mild small vessel ischemic changes and MR a of his head and carotid Doppler studies were unremarkable.  He has continued to use BiPAP therapy.  I am recommending slight titration and will increase his EPAP minimum to a pressure of 16 we will continue with a pressure support of 6.  He has had issues with some leg swelling despite taking spironolactone 12.5 mg daily.  Remotely his blood pressure had gotten low on 25 mg daily.  I have suggested that he alternate 12.5 mg every other day with 25 mg which should be helpful.  He also has had issues with sleep maintenance.  He has been taking Ambien with plus minus benefit.  I have recommended he discontinue this and recommended a trial of melatonin and he may need 5 mg.  In the future if sleep maintenance continues to be a problem he may benefit from low-dose Lunesta.  I will see him in 6 months for follow-up evaluation or sooner as needed.  Troy Sine, MD, [facility]  03/02/2020 6:16 PM

## 2020-03-03 ENCOUNTER — Other Ambulatory Visit: Payer: Self-pay | Admitting: Cardiovascular Disease

## 2020-03-08 ENCOUNTER — Telehealth: Payer: Self-pay

## 2020-03-08 NOTE — Telephone Encounter (Signed)
   [facility] Pre-operative Risk Assessment    HEARTCARE STAFF: - Please ensure there is not already an duplicate clearance open for this procedure. - Under Visit Info/Reason for Call, type in Other and utilize the format Clearance MM/DD/YY or Clearance TBD. Do not use dashes or single digits. - If request is for dental extraction, please clarify the # of teeth to be extracted.  Request for surgical clearance:  1. What type of surgery is being performed?  Cystoscopy with TURP    2. When is this surgery scheduled? 03/29/2020   3. What type of clearance is required (medical clearance vs. Pharmacy clearance to hold med vs. Both)? Pharmacy  4. Are there any medications that need to be held prior to surgery and how long?  Plavix, ask not lised  5. Practice name and name of physician performing surgery? [facility],  Dr. Alona Bene  6. What is the office phone number?  [phone]    7.   What is the office fax number? [phone]  8.   Anesthesia type (None, local, MAC, general) ?  Not listed    Jacqulynn Cadet 03/08/2020, 3:41 PM  _________________________________________________________________   (provider comments below)

## 2020-03-08 NOTE — Telephone Encounter (Signed)
   Primary Cardiologist: Nicki Guadalajara, MD  Chart reviewed as part of pre-operative protocol coverage. Given past medical history and time since last visit, based on ACC/AHA guidelines, Niv Darley would be at acceptable risk for the planned procedure without further cardiovascular testing.   I will route this recommendation to the requesting party via Epic fax function and remove from pre-op pool.  Please call with questions.  Per Dr. Landry Dyke recent office note, "he sees [name] for urologic follow-up and due to recurrent issues is tentatively scheduled to undergo repeat TURP surgery in March 29, 2020.  Apparently he has had issues with L4 sciatica and is scheduled to undergo an epidural injection on March 23, 2020.  He has been on Plavix long-term following a remote stroke started by Dr. Anne Hahn.  I have recommended that his last dose of Plavix to be done taken on August 4 which will give him 5 days to be off Plavix prior to his epidural injection.  He should also then remain off Plavix since his prostate surgery is scheduled 6 days later.  He has not had any neurologic symptoms on therapy."  Azalee Course, [place] 03/08/2020, 7:38 PM

## 2020-03-17 ENCOUNTER — Other Ambulatory Visit: Payer: Self-pay

## 2020-03-17 MED ORDER — CLOPIDOGREL BISULFATE 75 MG PO TABS: 75.0000 mg | ORAL_TABLET | Freq: Every day | ORAL | 2 refills | Status: DC

## 2020-03-17 MED ORDER — METOPROLOL TARTRATE 25 MG PO TABS: ORAL_TABLET | ORAL | 9 refills | Status: DC

## 2020-04-04 DIAGNOSIS — Z79899 Other long term (current) drug therapy: Secondary | ICD-10-CM

## 2020-04-14 ENCOUNTER — Telehealth: Payer: Self-pay | Admitting: *Deleted

## 2020-04-14 NOTE — Telephone Encounter (Signed)
   [facility] Pre-operative Risk Assessment     Request for surgical clearance:  1. What type of surgery is being performed? EPIDURAL STEROID INJECTION    2. When is this surgery scheduled? TBD    3. What type of clearance is required (medical clearance vs. Pharmacy clearance to hold med vs. Both)? BOTH  4.  Are there any medications that need to be held prior to surgery and how long?PLAVIX 7 DAYS  5.  Practice name and name of physician performing surgery? [facility]   6.  What is the office phone number? [phone]   7.   What is the office fax number? [phone]   8.   Anesthesia type (None, local, MAC, general) ? UNKNOWN    Alvina Filbert 04/14/2020, 12:06 PM  _________________________________________________________________   (provider comments below)

## 2020-04-14 NOTE — Telephone Encounter (Signed)
   Primary Cardiologist: Nicki Guadalajara, MD  Chart reviewed as part of pre-operative protocol coverage. Patient was contacted 04/14/2020 in reference to pre-operative risk assessment for pending surgery as outlined below.  Kenneth [name] was last seen on 03/02/20 by Dr. Tresa Endo with history of HTN, HLD, OSA, peripheral edema, remote stroke and amaurosis fugax on Plavix. Dr. Tresa Endo already evaluated him for pre-operative clearance at that time for both TURP and epidural injection, and also gave him clearance to hold Plavix at that time in preparation for his steroid injection, then remain off Plavix until his prostate surgery. Therefore the patient would be able to hold Plavix as requested, just need to ensure clinical stability since last OV. Called patient but got VM. LMOMTCB.  Laurann Montana, PA-C 04/14/2020, 1:28 PM

## 2020-04-15 NOTE — Telephone Encounter (Signed)
Follow up   Pt is returning call    

## 2020-04-15 NOTE — Telephone Encounter (Signed)
   Primary Cardiologist: Nicki Guadalajara, MD  Chart reviewed as part of pre-operative protocol coverage. Patient was contacted 04/15/2020 in reference to pre-operative risk assessment for pending surgery as outlined below.  Brylin Stanislawski was last seen on 03/02/2020 by Dr. Tresa Endo.  Since that day, Andrik Sandt has done well without chest pain or significant shortness of breath.  Therefore, based on ACC/AHA guidelines, the patient would be at acceptable risk for the planned procedure without further cardiovascular testing.   The patient was advised that if he develops new symptoms prior to surgery to contact our office to arrange for a follow-up visit, and he verbalized understanding.  I will route this recommendation to the requesting party via Epic fax function and remove from pre-op pool. Please call with questions.  Patient may hold plavix for 7 days prior to the procedure and restart as soon as possible after the procedure at the surgeon's discretion.   [place], [place] 04/15/2020, 2:10 PM

## 2020-04-17 LAB — COMPREHENSIVE METABOLIC PANEL
ALT: 16 IU/L (ref 0–44)
AST: 17 IU/L (ref 0–40)
Albumin/Globulin Ratio: 1.6 (ref 1.2–2.2)
Albumin: 3.7 g/dL (ref 3.6–4.6)
Alkaline Phosphatase: 58 IU/L (ref 48–121)
BUN/Creatinine Ratio: 15 (ref 10–24)
BUN: 18 mg/dL (ref 8–27)
Bilirubin Total: 0.8 mg/dL (ref 0.0–1.2)
CO2: 23 mmol/L (ref 20–29)
Calcium: 8.9 mg/dL (ref 8.6–10.2)
Chloride: 100 mmol/L (ref 96–106)
Creatinine, Ser: 1.24 mg/dL (ref 0.76–1.27)
GFR calc Af Amer: 62 mL/min/{1.73_m2} (ref >59)
GFR calc non Af Amer: 54 mL/min/{1.73_m2} — ABNORMAL LOW (ref >59)
Globulin, Total: 2.3 g/dL (ref 1.5–4.5)
Glucose: 96 mg/dL (ref 65–99)
Potassium: 4.6 mmol/L (ref 3.5–5.2)
Sodium: 133 mmol/L — ABNORMAL LOW (ref 134–144)
Total Protein: 6 g/dL (ref 6.0–8.5)

## 2020-04-25 ENCOUNTER — Other Ambulatory Visit: Payer: Self-pay | Admitting: Cardiovascular Disease

## 2020-05-10 MED ORDER — SPIRONOLACTONE 25 MG PO TABS: 25.0000 mg | ORAL_TABLET | Freq: Every day | ORAL | 3 refills | Status: DC

## 2020-05-10 NOTE — Addendum Note (Signed)
Addended by: Parke Poisson on: 05/10/2020 09:58 AM   Modules accepted: Orders

## 2020-06-04 ENCOUNTER — Other Ambulatory Visit: Payer: Self-pay

## 2020-06-04 ENCOUNTER — Ambulatory Visit (INDEPENDENT_AMBULATORY_CARE_PROVIDER_SITE_OTHER): Payer: Medicare Other | Admitting: Cardiology

## 2020-06-04 ENCOUNTER — Encounter: Payer: Self-pay | Admitting: Cardiology

## 2020-06-04 VITALS — BP 118/58 | HR 57 | Ht 72.0 in | Wt 188.0 lb

## 2020-06-04 DIAGNOSIS — R002 Palpitations: Secondary | ICD-10-CM | POA: Insufficient documentation

## 2020-06-04 DIAGNOSIS — G4733 Obstructive sleep apnea (adult) (pediatric): Secondary | ICD-10-CM

## 2020-06-04 DIAGNOSIS — I10 Essential (primary) hypertension: Secondary | ICD-10-CM | POA: Diagnosis not present

## 2020-06-04 DIAGNOSIS — E7800 Pure hypercholesterolemia, unspecified: Secondary | ICD-10-CM

## 2020-06-04 DIAGNOSIS — I2510 Atherosclerotic heart disease of native coronary artery without angina pectoris: Secondary | ICD-10-CM

## 2020-06-04 DIAGNOSIS — R600 Localized edema: Secondary | ICD-10-CM

## 2020-06-04 DIAGNOSIS — H5340 Unspecified visual field defects: Secondary | ICD-10-CM

## 2020-06-04 NOTE — Assessment & Plan Note (Signed)
Pt seen today with complaints of LLE edema- He had RLE edema in April 2021- venous dopplers of RLE then were negative for DVT

## 2020-06-04 NOTE — Assessment & Plan Note (Signed)
Amlodipine changed to diltiazem in the past for palpitations

## 2020-06-04 NOTE — Assessment & Plan Note (Signed)
On BiPAP.

## 2020-06-04 NOTE — Patient Instructions (Signed)
Medication Instructions:  Continue current medications  *If you need a refill on your cardiac medications before your next appointment, please call your pharmacy*   Lab Work: None Ordered  Testing/Procedures: Your physician has requested that you have a lower extremity venous duplex. This test is an ultrasound of the veins in the legs or arms. It looks at venous blood flow that carries blood from the heart to the legs or arms. Allow one hour for a Lower Venous exam. Allow thirty minutes for an Upper Venous exam. There are no restrictions or special instructions.  Follow-Up: At [facility], you and your health needs are our priority.  As part of our continuing mission to provide you with exceptional heart care, we have created designated Provider Care Teams.  These Care Teams include your primary Cardiologist (physician) and Advanced Practice Providers (APPs -  Physician Assistants and Nurse Practitioners) who all work together to provide you with the care you need, when you need it.  We recommend signing up for the patient portal called "MyChart".  Sign up information is provided on this After Visit Summary.  MyChart is used to connect with patients for Virtual Visits (Telemedicine).  Patients are able to view lab/test results, encounter notes, upcoming appointments, etc.  Non-urgent messages can be sent to your provider as well.   To learn more about what you can do with MyChart, go to [email or web address].    Your next appointment:   6-8 week(s)  The format for your next appointment:   In Person  Provider:   Nicki Guadalajara, MD

## 2020-06-04 NOTE — Progress Notes (Signed)
Cardiology Office Note:    Date:  06/04/2020   ID:  Kenneth [name], DOB 1935-10-29, MRN [id number]  PCP:  Jerlyn Ly, MD  Cardiologist:  Nicki Guadalajara, MD  Electrophysiologist:  None   Referring MD: Jerlyn Ly, MD   CC: edema LLE  History of Present Illness:    Kenneth [name] is a 84 y.o. male with a hx of hypertension, sleep apnea, dyslipidemia, amaurosis fugax treated with Plavix in the past, and back pain.  Echocardiogram January 2021 showed ejection fraction of 50 to 55%.  Patient saw his primary care provider in April 2021 with right lower extremity edema.  Right venous Doppler was negative then for DVT, there is no mention of the left system.  Dr. Tresa Endo had change the patient from amlodipine to diltiazem in the past for palpitations, the patient says his palpitations did improve with this change.  The patient is in the office today with complaints of left lower extremity edema.  He denies any orthopnea.  On exam he does have 1+ pitting edema on the left lower extremity, essentially no edema on the right.  He has no left-sided calf pain.  He has had no recent long trips.  Has had no prior history of blood clots and has no history of cancer.  He has shortness of breath with exertion but he thinks this is unchanged.  He denies any chest pain.  Past Medical History:  Diagnosis Date  . Amaurosis fugax 09/22/2014  . Diastolic dysfunction    grade 1 by echo 4/11  . Dyslipidemia   . HTN (hypertension)   . Sleep apnea    on BiPap    Past Surgical History:  Procedure Laterality Date  . CATARACT EXTRACTION Bilateral   . CHOLECYSTECTOMY    . SEPTOPLASTY  2000  . TONSILLECTOMY    . TRANSURETHRAL RESECTION OF PROSTATE  1997    Current Medications: Current Meds  Medication Sig  . allopurinol (ZYLOPRIM) 100 MG tablet Take 200 mg by mouth daily.   . bisoprolol (ZEBETA) 10 MG tablet Take 1 tablet (10 mg total) by mouth daily.  . Cholecalciferol (VITAMIN D-1000 MAX ST) 1000 UNITS  tablet Take 1,000 Units by mouth daily.  . clopidogrel (PLAVIX) 75 MG tablet Take 1 tablet (75 mg total) by mouth daily.  . diclofenac sodium (VOLTAREN) 1 % GEL Apply 1 application topically as needed.  . diltiazem (CARDIZEM CD) 120 MG 24 hr capsule TAKE 1 CAPSULE(120 MG) BY MOUTH DAILY  . diphenoxylate-atropine (LOMOTIL) 2.5-0.025 MG tablet Take by mouth 2 (two) times a day.   . ferrous sulfate 325 (65 FE) MG EC tablet Take 325 mg by mouth daily.  . fish oil-omega-3 fatty acids 1000 MG capsule Take 1,200 mg by mouth daily.   Marland Kitchen lisinopril (ZESTRIL) 40 MG tablet TAKE 1 TABLET BY MOUTH  DAILY  . loratadine (CLARITIN) 10 MG tablet Take 10 mg by mouth daily.  . Melatonin 3 MG TABS Take 1 tablet by mouth at bedtime as needed.  . metoprolol tartrate (LOPRESSOR) 25 MG tablet TAKE 1/2 TO 1 TABLET BY MOUTH AS NEEDED FOR PALPITATIONS  . rosuvastatin (CRESTOR) 20 MG tablet TAKE 1 TABLET BY MOUTH  DAILY  . spironolactone (ALDACTONE) 25 MG tablet Take 1 tablet (25 mg total) by mouth daily.  . tamsulosin (FLOMAX) 0.4 MG CAPS Take 0.4 mg by mouth daily after supper.  . testosterone cypionate (DEPOTESTOSTERONE CYPIONATE) 200 MG/ML injection Inject 400 mg into the muscle every 30 (thirty) days.  Marland Kitchen  ZENPEP 40000-126000 units CPEP Take 1 capsule by mouth daily.  . Zinc 30 MG TABS Take by mouth.     Allergies:   Patient has no known allergies.   Social History   Socioeconomic History  . Marital status: Married    Spouse name: Fannie Knee  . Number of children: 3  . Years of education: MD  . Highest education level: Not on file  Occupational History  . Occupation: Retired  Tobacco Use  . Smoking status: Former Games developer  . Smokeless tobacco: Never Used  Substance and Sexual Activity  . Alcohol use: No  . Drug use: No  . Sexual activity: Not on file  Other Topics Concern  . Not on file  Social History Narrative   Lives at home w/ his wife   Patient is right handed.   Patient drinks 2 cups caffeine daily.    Social Determinants of Health   Financial Resource Strain:   . Difficulty of Paying Living Expenses: Not on file  Food Insecurity:   . Worried About Programme researcher, broadcasting/film/video in the Last Year: Not on file  . Ran Out of Food in the Last Year: Not on file  Transportation Needs:   . Lack of Transportation (Medical): Not on file  . Lack of Transportation (Non-Medical): Not on file  Physical Activity:   . Days of Exercise per Week: Not on file  . Minutes of Exercise per Session: Not on file  Stress:   . Feeling of Stress : Not on file  Social Connections:   . Frequency of Communication with Friends and Family: Not on file  . Frequency of Social Gatherings with Friends and Family: Not on file  . Attends Religious Services: Not on file  . Active Member of Clubs or Organizations: Not on file  . Attends Banker Meetings: Not on file  . Marital Status: Not on file     Family History: The patient's family history includes Alzheimer's disease (age of onset: 52) in his mother; Emphysema in his father.  ROS:   Please see the history of present illness.     All other systems reviewed and are negative.  EKGs/Labs/Other Studies Reviewed:    The following studies were reviewed today: Echo Jan 2021- IMPRESSIONS    1. Left ventricular ejection fraction, by visual estimation, is 50 to  55%. The left ventricle has normal function. There is mildly increased  left ventricular hypertrophy.  2. Left ventricular diastolic parameters are consistent with Grade I  diastolic dysfunction (impaired relaxation).  3. The left ventricle demonstrates global hypokinesis.  4. Global right ventricle has normal systolic function.The right  ventricular size is normal.  5. Left atrial size was normal.  6. Right atrial size was normal.  7. The mitral valve is normal in structure. Trivial mitral valve  regurgitation. No evidence of mitral stenosis.  8. The tricuspid valve is normal in  structure.  9. The aortic valve is tricuspid. Aortic valve regurgitation is mild.  Mild to moderate aortic valve sclerosis/calcification without any evidence  of aortic stenosis.  10. The pulmonic valve was normal in structure. Pulmonic valve  regurgitation is not visualized.  11. Aortic dilatation noted.  12. There is mild dilatation of the aortic root measuring 40 mm.  13. Low normal LV systolic function; grade 1 diastolic dysfunction; mildly  dilated aortic root; mild LVH: sclerotic aortic valve with mild AI.   EKG:  EKG is not ordered today.  The ekg ordered  demonstrates NSR, SB  Recent Labs: 04/16/2020: ALT 16; BUN 18; Creatinine, Ser 1.24; Potassium 4.6; Sodium 133  Recent Lipid Panel    Component Value Date/Time   CHOL 83 12/18/2016 0803   TRIG 93 12/18/2016 0803   HDL 28 (L) 12/18/2016 0803   CHOLHDL 3.0 12/18/2016 0803   VLDL 19 12/18/2016 0803   LDLCALC 36 12/18/2016 0803    Physical Exam:    VS:  BP (!) 118/58   Pulse (!) 57   Ht 6' (1.829 m)   Wt 188 lb (85.3 kg)   SpO2 95%   BMI 25.50 kg/m     Wt Readings from Last 3 Encounters:  06/04/20 188 lb (85.3 kg)  03/02/20 185 lb 9.6 oz (84.2 kg)  08/07/19 186 lb (84.4 kg)     GEN: Well nourished, well developed in no acute distress HEENT: Normal NECK: No JVD; No carotid bruits CARDIAC: RRR, no murmurs, rubs, gallops RESPIRATORY:  Clear to auscultation without rales, wheezing or rhonchi  ABDOMEN: Soft, non-tender, non-distended MUSCULOSKELETAL:  1+ pitting edema LLE No deformity  SKIN: Warm and dry NEUROLOGIC:  Alert and oriented x 3 PSYCHIATRIC:  Normal affect   ASSESSMENT:    Edema of right lower leg Pt seen today with complaints of LLE edema- He had RLE edema in April 2021- venous dopplers of RLE then were negative for DVT  Palpitations Amlodipine changed to diltiazem in the past for palpitations   Essential hypertension, benign Controlled- Echo jan 2021- normal LVF  OSA (obstructive sleep  apnea) On BiPAP  Transient Visual field defect On Plavix after Neuro evaluation  PLAN:    Unilateral LE edema- r/o DVT on left (previous doppler was done on Rt).  If his doppler is negative for DVT consider changing his Diltiazem back to low dose Amlodipine for HTN.  Recommended support stockings, salt and fluid restriction.    Medication Adjustments/Labs and Tests Ordered: Current medicines are reviewed at length with the patient today.  Concerns regarding medicines are outlined above.  Orders Placed This Encounter  Procedures  . VAS US LOWER EXTREMITY VENOUS (DVT)   No orders of the defined types were placed in this encounter.   Patient Instructions  Medication Instructions:  Continue current medications  *If you need a refill on your cardiac medications before your next appointment, please call your pharmacy*   Lab Work: None Ordered  Testing/Procedures: Your physician has requested that you have a lower extremity venous duplex. This test is an ultrasound of the veins in the legs or arms. It looks at venous blood flow that carries blood from the heart to the legs or arms. Allow one hour for a Lower Venous exam. Allow thirty minutes for an Upper Venous exam. There are no restrictions or special instructions.  Follow-Up: At [facility], you and your health needs are our priority.  As part of our continuing mission to provide you with exceptional heart care, we have created designated Provider Care Teams.  These Care Teams include your primary Cardiologist (physician) and Advanced Practice Providers (APPs -  Physician Assistants and Nurse Practitioners) who all work together to provide you with the care you need, when you need it.  We recommend signing up for the patient portal called "MyChart".  Sign up information is provided on this After Visit Summary.  MyChart is used to connect with patients for Virtual Visits (Telemedicine).  Patients are able to view lab/test results,  encounter notes, upcoming appointments, etc.  Non-urgent messages can be  sent to your provider as well.   To learn more about what you can do with MyChart, go to [email or web address].    Your next appointment:   6-8 week(s)  The format for your next appointment:   In Person  Provider:   Nicki Guadalajara, MD        Signed, Corine Shelter, PA-C  06/04/2020 9:31 AM    [facility]

## 2020-06-04 NOTE — Assessment & Plan Note (Signed)
On Plavix after Neuro evaluation

## 2020-06-04 NOTE — Assessment & Plan Note (Signed)
Controlled- Echo jan 2021- normal LVF

## 2020-06-07 ENCOUNTER — Ambulatory Visit (HOSPITAL_COMMUNITY)
Admission: RE | Admit: 2020-06-07 | Discharge: 2020-06-07 | Disposition: A | Discharge status: Home / Self Care | Payer: Medicare Other | Source: Ambulatory Visit | Attending: Cardiology | Admitting: Cardiology

## 2020-06-07 ENCOUNTER — Telehealth: Payer: Self-pay | Admitting: *Deleted

## 2020-06-07 ENCOUNTER — Other Ambulatory Visit: Payer: Self-pay

## 2020-06-07 DIAGNOSIS — R600 Localized edema: Secondary | ICD-10-CM | POA: Diagnosis not present

## 2020-06-07 NOTE — Telephone Encounter (Signed)
Leave message for pt to call back 

## 2020-06-07 NOTE — Telephone Encounter (Signed)
-----   Message from Luke K Kilroy, PA-C sent at 06/07/2020 11:12 AM EDT ----- Please let the patient know I reviewed his doppler result.  If his Lt leg edema is still bothering him I would suggest changing his Diltiazem 120 mg to Amlodipine 5 mg. F/U as scheduled.  LUKE KILROY PA-C 06/07/2020 11:13 AM    

## 2020-06-08 ENCOUNTER — Telehealth: Payer: Self-pay | Admitting: *Deleted

## 2020-06-08 NOTE — Telephone Encounter (Signed)
-----   Message from Abelino Derrick, [place] sent at 06/07/2020 11:12 AM EDT ----- Please let the patient know I reviewed his doppler result.  If his Lt leg edema is still bothering him I would suggest changing his Diltiazem 120 mg to Amlodipine 5 mg. F/U as scheduled.  Corine Shelter PA-C 06/07/2020 11:13 AM

## 2020-06-08 NOTE — Telephone Encounter (Signed)
Received call back from pt, advised him of Corine Shelter recommendation, pt stated he still have a little swelling and he is going to keep an eye on it, advised pt he have an appointment with Corine Shelter in December and to keep that appt date and time, advised pt if anything changes to give our office a call back.

## 2020-07-26 ENCOUNTER — Ambulatory Visit (INDEPENDENT_AMBULATORY_CARE_PROVIDER_SITE_OTHER): Payer: Medicare Other | Admitting: Cardiology

## 2020-07-26 ENCOUNTER — Other Ambulatory Visit: Payer: Self-pay

## 2020-07-26 ENCOUNTER — Encounter: Payer: Self-pay | Admitting: Cardiology

## 2020-07-26 VITALS — BP 137/63 | HR 58 | Ht 72.0 in | Wt 185.6 lb

## 2020-07-26 DIAGNOSIS — I2510 Atherosclerotic heart disease of native coronary artery without angina pectoris: Secondary | ICD-10-CM | POA: Diagnosis not present

## 2020-07-26 DIAGNOSIS — G4733 Obstructive sleep apnea (adult) (pediatric): Secondary | ICD-10-CM

## 2020-07-26 DIAGNOSIS — R600 Localized edema: Secondary | ICD-10-CM

## 2020-07-26 DIAGNOSIS — R002 Palpitations: Secondary | ICD-10-CM

## 2020-07-26 DIAGNOSIS — H5340 Unspecified visual field defects: Secondary | ICD-10-CM

## 2020-07-26 DIAGNOSIS — N289 Disorder of kidney and ureter, unspecified: Secondary | ICD-10-CM | POA: Insufficient documentation

## 2020-07-26 DIAGNOSIS — I10 Essential (primary) hypertension: Secondary | ICD-10-CM

## 2020-07-26 NOTE — Progress Notes (Signed)
Cardiology Office Note:    Date:  07/26/2020   ID:  Kenneth [name], DOB Sep 03, 1935, MRN [id number]  PCP:  Jerlyn Ly, MD  Cardiologist:  Nicki Guadalajara, MD  Electrophysiologist:  None   Referring MD: Jerlyn Ly, MD   CC: LLE edema  History of Present Illness:    Kenneth [name] is a 84 y.o. male with a hx of hypertension, sleep apnea, dyslipidemia, amaurosis fugax treated with Plavix in the past, urinary retention, and back pain.  Echocardiogram January 2021 showed ejection fraction of 50 to 55%.  Patient saw his primary care provider in April 2021 with right lower extremity edema.  Right venous Doppler was negative then for DVT, there is no mention of the left system.  Dr. Tresa Endo had change the patient from amlodipine to diltiazem in the past for palpitations, the patient says his palpitations did improve with this change.    The patient was seen in the office 06/04/2020 with complaints of left lower extremity edema.  He denied any orthopnea.  On exam he did have 1+ pitting edema on the left lower extremity, essentially no edema on the right.  He had no left-sided calf pain.  He has had no recent long trips.  Has had no prior history of blood clots and has no history of cancer.  He has shortness of breath with exertion but he felt this was unchanged.  He denied any chest pain.  I ordered LLE venous doppler which was negative for DVT. I offered to try him on a different Ca++ blocker thinking this may be the culprit but he declined.  He returns today for follow up.  He continues to have painless 1+ LLE (ankle) edema.  There is no edema on the Rt.  Since we saw him last he has been referred to [facility] for proteinuria and renal insufficieny by his urologist, the patient tells me further work up is pending.    Past Medical History:  Diagnosis Date  . Amaurosis fugax 09/22/2014  . Diastolic dysfunction    grade 1 by echo 4/11  . Dyslipidemia   . HTN (hypertension)   . Sleep apnea     on BiPap    Past Surgical History:  Procedure Laterality Date  . CATARACT EXTRACTION Bilateral   . CHOLECYSTECTOMY    . SEPTOPLASTY  2000  . TONSILLECTOMY    . TRANSURETHRAL RESECTION OF PROSTATE  1997    Current Medications: Current Meds  Medication Sig  . allopurinol (ZYLOPRIM) 100 MG tablet Take 200 mg by mouth daily.   . bisoprolol (ZEBETA) 10 MG tablet Take 1 tablet (10 mg total) by mouth daily.  . Cholecalciferol 25 MCG (1000 UT) tablet Take 1,000 Units by mouth daily.  . clopidogrel (PLAVIX) 75 MG tablet Take 1 tablet (75 mg total) by mouth daily.  . diclofenac sodium (VOLTAREN) 1 % GEL Apply 1 application topically as needed.  . diltiazem (CARDIZEM CD) 120 MG 24 hr capsule TAKE 1 CAPSULE(120 MG) BY MOUTH DAILY  . ferrous sulfate 325 (65 FE) MG EC tablet Take 325 mg by mouth daily.  . fish oil-omega-3 fatty acids 1000 MG capsule Take 1,200 mg by mouth daily.   Marland Kitchen lisinopril (ZESTRIL) 40 MG tablet TAKE 1 TABLET BY MOUTH  DAILY  . loratadine (CLARITIN) 10 MG tablet Take 10 mg by mouth daily.  . Melatonin 3 MG TABS Take 1 tablet by mouth at bedtime as needed.  . metoprolol tartrate (LOPRESSOR) 25 MG tablet TAKE  1/2 TO 1 TABLET BY MOUTH AS NEEDED FOR PALPITATIONS  . rosuvastatin (CRESTOR) 20 MG tablet TAKE 1 TABLET BY MOUTH  DAILY  . spironolactone (ALDACTONE) 25 MG tablet Take 1 tablet (25 mg total) by mouth daily.  . tamsulosin (FLOMAX) 0.4 MG CAPS Take 0.4 mg by mouth daily after supper.  . testosterone cypionate (DEPOTESTOSTERONE CYPIONATE) 200 MG/ML injection Inject 400 mg into the muscle every 30 (thirty) days.  . Zinc 30 MG TABS Take by mouth.     Allergies:   Patient has no known allergies.   Social History   Socioeconomic History  . Marital status: Married    Spouse name: Kenneth [name]  . Number of children: 3  . Years of education: MD  . Highest education level: Not on file  Occupational History  . Occupation: Retired  Tobacco Use  . Smoking status: Former Games developer  .  Smokeless tobacco: Never Used  Substance and Sexual Activity  . Alcohol use: No  . Drug use: No  . Sexual activity: Not on file  Other Topics Concern  . Not on file  Social History Narrative   Lives at home w/ his wife   Patient is right handed.   Patient drinks 2 cups caffeine daily.   Social Determinants of Health   Financial Resource Strain: Not on file  Food Insecurity: Not on file  Transportation Needs: Not on file  Physical Activity: Not on file  Stress: Not on file  Social Connections: Not on file     Family History: The patient's family history includes Alzheimer's disease (age of onset: 81) in his mother; Emphysema in his father.  ROS:   Please see the history of present illness.     All other systems reviewed and are negative.  EKGs/Labs/Other Studies Reviewed:    The following studies were reviewed today: Venous doppler 06/07/2020- Summary:  RIGHT:  - No evidence of common femoral vein obstruction.    LEFT:  - No evidence of deep vein thrombosis in the lower extremity. No indirect  evidence of obstruction proximal to the inguinal ligament.  - No cystic structure found in the popliteal fossa.  - Mild superficial edema notes at the medial ankle and foot.    Echo 08/26/2019- IMPRESSIONS    1. Left ventricular ejection fraction, by visual estimation, is 50 to  55%. The left ventricle has normal function. There is mildly increased  left ventricular hypertrophy.  2. Left ventricular diastolic parameters are consistent with Grade I  diastolic dysfunction (impaired relaxation).  3. The left ventricle demonstrates global hypokinesis.  4. Global right ventricle has normal systolic function.The right  ventricular size is normal.  5. Left atrial size was normal.  6. Right atrial size was normal.  7. The mitral valve is normal in structure. Trivial mitral valve  regurgitation. No evidence of mitral stenosis.  8. The tricuspid valve is normal in  structure.  9. The aortic valve is tricuspid. Aortic valve regurgitation is mild.  Mild to moderate aortic valve sclerosis/calcification without any evidence  of aortic stenosis.  10. The pulmonic valve was normal in structure. Pulmonic valve  regurgitation is not visualized.  11. Aortic dilatation noted.  12. There is mild dilatation of the aortic root measuring 40 mm.  13. Low normal LV systolic function; grade 1 diastolic dysfunction; mildly  dilated aortic root; mild LVH: sclerotic aortic valve with mild AI.    EKG:  EKG is not ordered today.    Recent Labs: 04/16/2020: ALT 16;  BUN 18; Creatinine, Ser 1.24; Potassium 4.6; Sodium 133  Recent Lipid Panel    Component Value Date/Time   CHOL 83 12/18/2016 0803   TRIG 93 12/18/2016 0803   HDL 28 (L) 12/18/2016 0803   CHOLHDL 3.0 12/18/2016 0803   VLDL 19 12/18/2016 0803   LDLCALC 36 12/18/2016 0803    Physical Exam:    VS:  BP 137/63   Pulse (!) 58   Ht 6' (1.829 m)   Wt 185 lb 9.6 oz (84.2 kg)   BMI 25.17 kg/m     Wt Readings from Last 3 Encounters:  07/26/20 185 lb 9.6 oz (84.2 kg)  06/04/20 188 lb (85.3 kg)  03/02/20 185 lb 9.6 oz (84.2 kg)     GEN: Well nourished, well developed in no acute distress HEENT: Normal NECK: No JVD; No carotid bruits CARDIAC: RRR, no murmurs, rubs, gallops RESPIRATORY:  Clear to auscultation without rales, wheezing or rhonchi  ABDOMEN: Soft, non-tender, non-distended MUSCULOSKELETAL:  1+ LLE- ankle- edema; No deformity  SKIN: Warm and dry NEUROLOGIC:  Alert and oriented x 3 PSYCHIATRIC:  Normal affect   ASSESSMENT:    Edema of left lower leg Pt seen today with complaints of LLE edema- He had RLE edema in April 2021- venous dopplers of RLE then were negative for DVT, dopplers of LLE negative for DVT 06/07/2020.  I suspect this is venous insufficieny- recommend fluid and sodium restriction, support stocking. No evidence of CHF.  New renal insufficiency He is being followed by [facility]- will defer any medication changes to them.  Palpitations Amlodipine changed to diltiazem in the past for palpitations   Essential hypertension, benign Controlled- Echo jan 2021- normal LVF  OSA (obstructive sleep apnea) On BiPAP  Transient Visual field defect On Plavix after Neuro evaluation  PLAN:    F/U Dr Tresa Endo in March.   Medication Adjustments/Labs and Tests Ordered: Current medicines are reviewed at length with the patient today.  Concerns regarding medicines are outlined above.  No orders of the defined types were placed in this encounter.  No orders of the defined types were placed in this encounter.   There are no Patient Instructions on file for this visit.   Jolene Provost, PA-C  07/26/2020 9:42 AM    [facility]

## 2020-07-26 NOTE — Patient Instructions (Signed)
Medication Instructions:  Continue current medications  *If you need a refill on your cardiac medications before your next appointment, please call your pharmacy*   Lab Work: None Ordered   Testing/Procedures: None Ordered   Follow-Up: At [facility], you and your health needs are our priority.  As part of our continuing mission to provide you with exceptional heart care, we have created designated Provider Care Teams.  These Care Teams include your primary Cardiologist (physician) and Advanced Practice Providers (APPs -  Physician Assistants and Nurse Practitioners) who all work together to provide you with the care you need, when you need it.  We recommend signing up for the patient portal called "MyChart".  Sign up information is provided on this After Visit Summary.  MyChart is used to connect with patients for Virtual Visits (Telemedicine).  Patients are able to view lab/test results, encounter notes, upcoming appointments, etc.  Non-urgent messages can be sent to your provider as well.   To learn more about what you can do with MyChart, go to [email or web address].    Your next appointment:   3 month(s)  The format for your next appointment:   In Person  Provider:   You may see Saniah Schroeter Kelly, MD or one of the following Advanced Practice Providers on your designated Care Team:    Hao Meng, PA-C  Angela Duke, PA-C or   Krista Kroeger, PA-C     

## 2020-10-18 ENCOUNTER — Other Ambulatory Visit: Payer: Self-pay | Admitting: Cardiovascular Disease

## 2020-11-05 ENCOUNTER — Other Ambulatory Visit: Payer: Self-pay

## 2020-11-05 ENCOUNTER — Ambulatory Visit (INDEPENDENT_AMBULATORY_CARE_PROVIDER_SITE_OTHER): Payer: Medicare Other | Admitting: Cardiovascular Disease

## 2020-11-05 ENCOUNTER — Encounter: Payer: Self-pay | Admitting: Cardiovascular Disease

## 2020-11-05 VITALS — BP 147/77 | HR 61 | Ht 72.0 in | Wt 189.2 lb

## 2020-11-05 DIAGNOSIS — G4733 Obstructive sleep apnea (adult) (pediatric): Secondary | ICD-10-CM | POA: Diagnosis not present

## 2020-11-05 DIAGNOSIS — E7800 Pure hypercholesterolemia, unspecified: Secondary | ICD-10-CM | POA: Diagnosis not present

## 2020-11-05 DIAGNOSIS — I5189 Other ill-defined heart diseases: Secondary | ICD-10-CM | POA: Diagnosis not present

## 2020-11-05 DIAGNOSIS — I10 Essential (primary) hypertension: Secondary | ICD-10-CM | POA: Diagnosis not present

## 2020-11-05 DIAGNOSIS — G453 Amaurosis fugax: Secondary | ICD-10-CM

## 2020-11-05 DIAGNOSIS — E785 Hyperlipidemia, unspecified: Secondary | ICD-10-CM

## 2020-11-05 MED ORDER — SPIRONOLACTONE 25 MG PO TABS: ORAL_TABLET | ORAL | 3 refills | Status: DC

## 2020-11-05 NOTE — Patient Instructions (Signed)
Medication Instructions:  INCREASE- Spironolactone 25 mg by mouth daily  *If you need a refill on your cardiac medications before your next appointment, please call your pharmacy*   Lab Work: None Ordered   Testing/Procedures: None Ordered   Follow-Up: At [facility], you and your health needs are our priority.  As part of our continuing mission to provide you with exceptional heart care, we have created designated Provider Care Teams.  These Care Teams include your primary Cardiologist (physician) and Advanced Practice Providers (APPs -  Physician Assistants and Nurse Practitioners) who all work together to provide you with the care you need, when you need it.  We recommend signing up for the patient portal called "MyChart".  Sign up information is provided on this After Visit Summary.  MyChart is used to connect with patients for Virtual Visits (Telemedicine).  Patients are able to view lab/test results, encounter notes, upcoming appointments, etc.  Non-urgent messages can be sent to your provider as well.   To learn more about what you can do with MyChart, go to [email or web address].    Your next appointment:   6 month(s)  The format for your next appointment:   In Person  Provider:   You may see Nicki Guadalajara, MD or one of the following Advanced Practice Providers on your designated Care Team:    Azalee Course, PA-C  Micah Flesher, PA-C or   Judy Pimple, [place]

## 2020-11-05 NOTE — Progress Notes (Signed)
Patient ID: Kenneth [name], male   DOB: 1936-08-11, 85 y.o.   MRN: [id number]     PCP: [name] at [facility] in [place]  HPI: Kenneth [name] is a 85 y.o. male who presents for an 8 month f/u sleep/cardiology evaluation.  Mr. Reali has a history of hypertension, hyperlipidemia, gout, and is status post TURP surgery by Dr. [name].  He has a residual underactive bladder and as result straight caths himself one time every day.  He states his post void residuals are in the 250-300 cc range.  He also has  bilateral renal cysts. He has a history of documented moderate obstructive sleep apnea with an AHI of 22.7 and an RDI of 33 and has been on BiPAP Auto therapy with an EPAP minimum of 10, IPAP maximum potential up to 25 with a delta of for which he started in July 2012. 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  Recently, he has had issues with very dry mouth.  He uses a nasal mask.  He has had intermittent use of a chin strap.    An echo Doppler study in 05/2013 revealed normal left ventricle wall thickness with normal systolic and diastolic function. He did have mild aortic regurgitation due to poor central leaflet coaptation. There was borderline prolapse of his anterior mitral valve leaflet with trivial mitral regurgitation. He had normal pulmonary pressures assessment artery systolic pressure 19 mm.  He has had issues in the past with peripheral edema, which had led to an increased dose of his torsemide last year.  He denies exertionally precipitated chest pain.  He did experience one episode of some chest fullness or night when he was sitting on his bed, but this ultimately resolved, and he has not had any further sensation He is unaware of palpitations.  He denies presyncope or syncope.  In December 2015, he complained of experiencing transient episodes of a field defect in the lateral left eye.  These were both self-limiting.  He saw an ophthalmologist  who recommended that he notify me concerning the symptoms  His history is notable in that he did sustain to prior head injuries many years ago.  Once when he was in high school and had a severe concussion.  Another time occurred when he fell out of a tree stand when he was hunting.  I subtotal referred him to [facility] neurology.  Due to my concern for possible amaurosis fugax.  He saw Dr. [name], who felt that the patient had experienced at least 3 episodes of amaurosis fugax in his left eye.  He underwent an MRI of the brain that showed mild small vessel ischemic changes in the MRA of the head and carotid Doppler studies which were unremarkable.  He was taken off aspirin and started on Plavix 75 mg.  He denies any recurrent symptomatology.  His primary physician is Dr. [name] in [place].   He has a history of hyperlipidemia and is tolerating Crestor 20 mg.  He has been on amlodipine 5 mg, Bystolic 20 mg, and lisinopril 40 mg for hypertension.  He was found to have increasing PSA levels with his levels rising from 1.5  to 7 and saw Dr. [name] for biopsy. He underwent cholecystectomy and at that time his ECG showed a PR interval at 212 ms, which was of concern by the anesthesiologist.  He tolerated surgery well without cardiovascular compromise.   In 2017 he had noticed a change in development  of shortness of breath with less activity.  He denied associated chest tightness and was experiencing shortness of breath with walking less than 100 yards.  This has been ongoing for several months.  He had noticed some trace swelling in his ankles.  A nuclear stress test which was done on 04/11/2016.  Ejection fraction was 59%.  There were no ECG changes.  There was normal perfusion.  An echo Doppler study on 04/12/2016 showed mild LVH with normal systolic function with very mild grade 1 diastolic dysfunction.  There was mild aortic insufficiency and trivial TR and PR.  Pulmonary pressures were  normal.  From a sleep perspective, he continues to use his BiPAP therapy.  He admits to his mouth getting dry during the day.  He has a nasal mask.  He may be having oral venting.  In the past.  He was given a chin strap but has not been consistently using this.  He has not had any recent download.  He believes his sleep initiation is good but he is having difficulty with sleep maintenance. He uses [employer] for his DME company.    He had experienced swelling in his lower extremity and a lower extremity Doppler study was negative for DVT in [place].  He was able to walk a mile without significant abnormalities, but when he has to carry his garbage can to the curb he notes shortness of breath.    Since his office visit in April 2018, he has continued to be without chest pain.  There is no change in his previous shortness of breath.  I had attempted to initiate spironolactone with his diastolic dysfunction but apparently during my absence heat, cold, the office and was concerned that he had not noticed any significant change.  He was advised to go back and take one half of his previous dose of torsemide and one half of spironolactone.  He has been doing this and actually he has felt somewhat better.  He continues to take lisinopril 40 mg, Bystolic 20 mg, and amlodipine for hypertension.  He denies significant swelling.  He is on Crestor for hyperlipidemia.   When I saw him in October 2018, he had received a new BiPAP machine, ResMed air curve 10 feet auto.  His set up date was 07/06/2017.  His minimum EPAP pressure is 9 with a maximum IPAP pressure of 25.  In the office today.  I obtained a download from 08/12/2017 through 09/10/2017.  He was 100% compliant with usage stays and usage greater than 4 hours was 97%.  He is averaging almost 6 hours and 30 minutes of sleep per night.  AHI is excellent at 1.2 and his 95th percentile pressure was 14.7/10.8 with a maximum average pressure of 15.8/11.8.  He has  noticed significant improvement with his new machine.  He is sleeping well.  When I saw him in January 2019 he was experiencing rare palpitations which he described as a chest flutter, which may last up to 30 seconds.  He has noticed this rarely every 2-3 months and typically there is an abrupt onset and abrupt cessation. Marland Kitchen  He denies presyncope or syncope.  He denies associated chest tightness.  At that office visit, I discontinued amlodipine and change this to Cardizem CD 120 mg at night and also gave a prescription from a told metoprolol tartrate to take 12.5 to 25 mg as needed.  I also discussed improved sleep duration at all possible.  At his evaluation in June 2019  his major complaint is that of fatigue well as exertional dyspnea.  He denied chest pain, swelling or awareness of palpitations.  He continued to be on Bystolic 20 mg, diltiazem CD 120 daily, lisinopril 40 mg daily, in addition to low-dose spironolactone 12.5 mg and torsemide just 5 mg.  Continues to use BiPAP and a download from Dec 22, 2017 through January 20, 2018 shows 100% compliance.  However, sleep duration was only 5 hours and 45 minutes.  95th percentile pressure was 15/11 with a maximum pressure 16/12.  AHI is excellent at 1.0.    I last evaluated him on December 11, 2018 and a telemedicine visit.  His blood pressure was controlled.  I reviewed a recent download which revealed 100% compliance with BiPAP usage however he was not using therapy for long enough duration and was only averaging 3 hours and 14 minutes.  His AHI was elevated at 20.3 on an EPAP minimum of 9 and IPAP maximum of 25.  I increase his EPAP minimum to 11 and further increased his pressure support to 5.  He was walking several days per week.  His echo study had previously demonstrated grade 1 diastolic dysfunction.  Coronary CT had noticed coronary calcification and for this reason I continued him on rosuvastatin at 0 mg attempt to induce LDL reduction to the 50s or  below.  He had undergone laboratory by his primary provider and LDL cholesterol was excellent at 37.  Primary provider question whether or not his dose of rosuvastatin should be decreased to 20 mg down to 10 mg..  He has been taking rosuvastatin 20 mg daily  When I  saw him in October 2019 he continued to experience fatigability.  He was continued to have issues with diarrhea and some abdominal pain and has been seeing Dr. Christoper Fabian at [place].  He was to undergo an abdominal vascular MRI.  He denied any chest tightness or pressure.  He was continuing to use BiPAP typically goes to bed between 10 and 1030 and wakes up around 6:30 AM.  A download in July 2019 showed 100% usage with an AHI of 4.5 he continues to experience some fatigue. He has been evaluated by Dr. Christoper Fabian at [facility] with pain and diarrhea. He tells me he will be undergoing an abdominal vascular MRI. He also is status post endoscopy. He denies chest pain. At times he notes shortness of breath particularly if he has to [place] b but a subsequent download in September 2019 showed increased AHI at 9.9 and was having some significant mask issues.  Presently, he has been on diltiazem 120 mg daily, lisinopril 40 mg, Bystolic 10 mg, in addition to Spironolactone 12.5 mg.  He also is on rosuvastatin 20 mg for hyperlipidemia.  His primary physician has been checking laboratory.  Remotely his LDL cholesterols have consistently been excellent and less than 60.  He was evaluated by Almyra Deforest, PAC in February 2020.  Of note, he had undergone an abdominal CT and this had demonstrated mild coronary artery calcification.  He was seen by his primary care provider Dr. [name] early February 2020 and his blood pressure was 106/56.  He was feeling well and was asymptomatic.  I  evaluated him in a telemedicine visit on December 11, 2018.  A download was obtained  of his  BiPAP unit from March 30 through December 10, 2018.  Usage was 100%.  However, daily usage declined  to only 3 hours and 14 minutes and only 10 out  of 30 days with usage greater than 4 hours.  He apparently has a full facemask.  He never received the air fit F 30 little I that we had recommended but has a new full facemask covering his nose.  On his most recent download he did not have any significant leak.  He is set at an EPAP minimum pressure of 9 with an IPAP maximum pressure of 25 and a pressure support of 4.  AHI, however is elevated at 20.3 with an apnea index of 20.1.  95th percentile pressure was 22.1/18.1.  Typically he has been taking the mask off after he goes to the bathroom sometime during the night and typically has not put it on consistently.  He also has had some issues with dry mouth.    During that evaluation I adjusted his BiPAP therapy and increased EPAP minimum to 11 and increase pressure support to 5 such that his initial pressure will be 16/11 with potential titration up to 25/20.  Gust the importance of improved sleep duration and using CPAP for the preponderance of the night.  I last saw him on August 07, 2019 and since his prior evaluation he had seen Dr. [name], his primary physician who I checked laboratory. Laboratory  showed an LDL cholesterol at 37 and his primary physician was wondering if his rosuvastatin dose should be reduced 20 down to 10 mg.  Prior CT did demonstrate coronary calcification as well as suggested a possible small pericardial effusion.  He has continued to use BiPAP and a new download from July 08, 2019 through August 06, 2019 shows 100% usage and usage greater than 4 hours at 73%.  Average usage on days used was 4 hours and 43 minutes.  He has issues with dry mouth.  AHI was improved from previously but still elevated at 10.3.  His 95th percentile pressure was 22.4/17.4 with a maximum average pressure 23.4/18.5.  He continues to see Dr. [name] for his urologic issues.  There is strong family history of prostate CA and the patient requires self cathing on  a daily basis.  He denies chest pain.  Is admit to a vague chest sensation if he bends over.  He denies any chest pain or shortness of breath with cutting his grass with a walking lawnmower.  He underwent a follow-up echo Doppler study on August 26, 2019 which showed an EF of 50 to 55% and grade 1 diastolic dysfunction.  There was mild to moderate aortic valve sclerosis without stenosis and mild aortic insufficiency.  There is mild dilation of his aortic root measuring 40 mm.    I last saw him in July 2021 and over the prior 7 months he continued to do well. He  had issues with chronic pain and will need to undergo epidural injection scheduled tentatively for March 23, 2020.  He was to undergo repeat TURP surgery with Dr. [name] on August 16.  He has noticed some intermittent ankle swelling despite taking spironolactone 12.5 mg daily.  He has continued to use BiPAP therapy.  He does have some issues with sleep maintenance.  Download was obtained in the office today from June 20 through March 01, 2020 which shows 100% compliance.  However he usage duration is only 4 hours and 29 minutes.  AHI is elevated at 11.4 with an apnea index of 11.1 and central index of 4.4.  His 95th percentile pressure is 23.4/17.4 with a maximum average pressure 24/18 cm of water.  Since I last  saw him, he denies any episodes of chest pain or shortness of breath.  His blood pressure has been mildly elevated and he has been on lisinopril 40 mg daily, bisoprolol 10 mg, diltiazem 120 mg, and spironolactone 12.5 mg daily.  He has a prescription for metoprolol tartrate to take 12.5 or 25 mg as needed for palpitations.  He is scheduled to undergo a cystoscopy with Dr. [name] in 2 weeks.  He has been undergoing daily straight caths.  He has continued to use CPAP therapy and a download was obtained from February 23 through November 04, 2020.  He is compliant with usage days and average usage is 4 hours and 53 minutes.  Current settings include a  minimum EPAP of 16 with maximum IPAP of 25 and pressure support of 6.  He continues to have complex events with an AHI of 14 and has both obstructive and central apnea.  He denies shortness of breath.  He presents for evaluation.   Past Medical History:  Diagnosis Date  . Amaurosis fugax 09/22/2014  . Diastolic dysfunction    grade 1 by echo 4/11  . Dyslipidemia   . HTN (hypertension)   . Sleep apnea    on BiPap    Past Surgical History:  Procedure Laterality Date  . CATARACT EXTRACTION Bilateral   . CHOLECYSTECTOMY    . SEPTOPLASTY  2000  . TONSILLECTOMY    . TRANSURETHRAL RESECTION OF PROSTATE  1997    No Known Allergies  Current Outpatient Medications  Medication Sig Dispense Refill  . allopurinol (ZYLOPRIM) 100 MG tablet Take 200 mg by mouth daily.     . bisoprolol (ZEBETA) 10 MG tablet Take 1 tablet (10 mg total) by mouth daily. 90 tablet 3  . Cholecalciferol 25 MCG (1000 UT) tablet Take 1,000 Units by mouth daily.    . clopidogrel (PLAVIX) 75 MG tablet Take 1 tablet (75 mg total) by mouth daily. 90 tablet 2  . diltiazem (CARDIZEM CD) 120 MG 24 hr capsule TAKE 1 CAPSULE(120 MG) BY MOUTH DAILY 90 capsule 2  . ferrous sulfate 325 (65 FE) MG EC tablet Take 325 mg by mouth daily.    . fish oil-omega-3 fatty acids 1000 MG capsule Take 1,200 mg by mouth daily.     Marland Kitchen lisinopril (ZESTRIL) 40 MG tablet TAKE 1 TABLET BY MOUTH  DAILY 90 tablet 3  . loratadine (CLARITIN) 10 MG tablet Take 10 mg by mouth daily.    . Melatonin 3 MG TABS Take 1 tablet by mouth at bedtime as needed.    . metoprolol tartrate (LOPRESSOR) 25 MG tablet TAKE 1/2 TO 1 TABLET BY MOUTH AS NEEDED FOR PALPITATIONS 30 tablet 9  . rosuvastatin (CRESTOR) 20 MG tablet TAKE 1 TABLET BY MOUTH  DAILY 90 tablet 3  . tamsulosin (FLOMAX) 0.4 MG CAPS Take 0.4 mg by mouth daily after supper.    . testosterone cypionate (DEPOTESTOSTERONE CYPIONATE) 200 MG/ML injection Inject 400 mg into the muscle every 30 (thirty) days.    .  Zinc 30 MG TABS Take by mouth.    . spironolactone (ALDACTONE) 25 MG tablet Take 25 mg (1 tablet) alternating every other day with 12.5 mg (0.5 mg) 90 tablet 3   No current facility-administered medications for this visit.    Social History   Socioeconomic History  . Marital status: Married    Spouse name: Collie Siad  . Number of children: 3  . Years of education: MD  . Highest education level: Not on  file  Occupational History  . Occupation: Retired  Tobacco Use  . Smoking status: Former Research scientist (life sciences)  . Smokeless tobacco: Never Used  Substance and Sexual Activity  . Alcohol use: No  . Drug use: No  . Sexual activity: Not on file  Other Topics Concern  . Not on file  Social History Narrative   Lives at home w/ his wife   Patient is right handed.   Patient drinks 2 cups caffeine daily.   Social Determinants of Health   Financial Resource Strain: Not on file  Food Insecurity: Not on file  Transportation Needs: Not on file  Physical Activity: Not on file  Stress: Not on file  Social Connections: Not on file  Intimate Partner Violence: Not on file    Family History  Problem Relation Age of Onset  . Alzheimer's disease Mother 71  . Emphysema Father    Socially he is married and has 3 children, 14 grandchildren and 3 great-grandchildren. There is no tobacco or alcohol use.  ROS General: Negative; No fevers, chills, or night sweats;  HEENT: Positive for 2 transient episodes over the past year with left lateral eye field defect, sinus congestion, difficulty swallowing Pulmonary: Negative; No cough, wheezing, shortness of breath, hemoptysis Cardiovascular: See history of present illness GI: Negative; No nausea, vomiting, diarrhea, or abdominal pain GU: Negative; No dysuria, hematuria, or difficulty voiding Musculoskeletal: Positive for history of gout Hematologic/Oncology: Negative; no easy bruising, bleeding Endocrine: Negative; no heat/cold intolerance; no diabetes Neuro:  Positive for h/o amaurosis fugax; resolved since Plavix was instituted Skin: Negative; No rashes or skin lesions Psychiatric: Negative; No behavioral problems, depression Sleep: Positive for sleep apnea.  With BiPAP therapy he is unaware of any breakthrough snoring, daytime sleepiness, hypersomnolence, bruxism, restless legs, hypnogognic hallucinations, no cataplexy Other comprehensive 14 point system review is negative.   PE BP (!) 147/77   Pulse 61   Ht 6' (1.829 m)   Wt 189 lb 3.2 oz (85.8 kg)   SpO2 95%   BMI 25.66 kg/m    Repeat blood pressure by me was elevated at 154/76  Wt Readings from Last 3 Encounters:  11/05/20 189 lb 3.2 oz (85.8 kg)  07/26/20 185 lb 9.6 oz (84.2 kg)  06/04/20 188 lb (85.3 kg)   General: Alert, oriented, no distress.  Skin: normal turgor, no rashes, warm and dry HEENT: Normocephalic, atraumatic. Pupils equal round and reactive to light; sclera anicteric; extraocular muscles intact;  Nose without nasal septal hypertrophy Mouth/Parynx benign; Mallinpatti scale 3Neck: No JVD, no carotid bruits; normal carotid upstroke Lungs: clear to ausculatation and percussion; no wheezing or rales Chest wall: without tenderness to palpitation Heart: PMI not displaced, RRR, s1 s2 normal, 1/6 systolic murmur, no diastolic murmur, no rubs, gallops, thrills, or heaves Abdomen: soft, nontender; no hepatosplenomehaly, BS+; abdominal aorta nontender and not dilated by palpation. Back: no CVA tenderness Pulses 2+ Musculoskeletal: full range of motion, normal strength, no joint deformities Extremities: no clubbing cyanosis or edema, Homan's sign negative  Neurologic: grossly nonfocal; Cranial nerves grossly wnl Psychologic: Normal mood and affect   ECG (independently read by me): Sinus bradycardia at 59, 1st degree block; PR 208 msec  July 2021 ECG (independently read by me): Sinus bradycardia 52 bpm.  No ectopy.  PR interval 206 ms  August 07, 2019 ECG  (independently read by me): Sinus rhythm at 63 bpm with first-degree AV block with a PR 218 ms.  No ectopy.  October 2019 ECG (independently read by me):  Sinus bradycardia with first-degree AV block.  PR interval 214 ms.  No ST segment changes.  No ectopy.  June 2019 ECG (independently read by me): Sinus bradycardia at 50 bpm.  First-degree AV block.  No significant ST segment changes.  January 2019 ECG (independently read by me): Sinus bradycardia 59 bpm.  Borderline first degree AV block with a PR interval 202 ms; QTC normal at 394 ms.  No ectopy.  April 2018 ECG (independently read by me): Normal sinus rhythm at 60, normal intervals.  No ST segment changes  October 2017 ECG (independently read by me): Sinus bradycardia at 57 bpm.  PR interval 200 ms.  QTc interval 395 ms.  July 2017 ECG (independently read by me): Normal sinus rhythm at 61 bpm.  Normal QTc with borderline first-degree AV block  November 2016 ECG (independently read by me): Normal sinus rhythm at 60 bpm.  PR interval 200 ms.  June 2016 ECG (independently read by me): Sinus rhythm at 63.  Intervals normal.  No ST segment changes  December 2015 ECG (independently read by me): Sinus bradycardia 59 bpm.  Mild first-degree AV block with a PR interval 212 ms.  QTc interval normal.  No ST segment changes.  June 2015 ECG (independently read by me): sinus rhythm at 59 beats per minute.  Borderline first degree AV block with a PR interval of 204 ms.  ECG: Sinus rhythm at 61 beats per minute. Borderline first degree AV block with PR interval 200 ms. QT interval normal 388 ms.  LABS: BMP Latest Ref Rng & Units 04/16/2020 12/18/2016 03/20/2016  Glucose 65 - 99 mg/dL 96 95 97  BUN 8 - 27 mg/dL $Remove'18 12 20  'PYsnvpk$ Creatinine 0.76 - 1.27 mg/dL 1.24 1.05 1.22(H)  BUN/Creat Ratio 10 - 24 15 - -  Sodium 134 - 144 mmol/L 133(L) 142 143  Potassium 3.5 - 5.2 mmol/L 4.6 4.1 4.2  Chloride 96 - 106 mmol/L 100 110 104  CO2 20 - 29 mmol/L $RemoveB'23 25 31  'bqsrnkMd$ Calcium  8.6 - 10.2 mg/dL 8.9 8.5(L) 9.0   Hepatic Function Latest Ref Rng & Units 04/16/2020 12/18/2016 03/20/2016  Total Protein 6.0 - 8.5 g/dL 6.0 6.1 6.8  Albumin 3.6 - 4.6 g/dL 3.7 3.6 3.9  AST 0 - 40 IU/L $Remov'17 15 19  'YhKghy$ ALT 0 - 44 IU/L $Remov'16 14 17  'LZQqLM$ Alk Phosphatase 48 - 121 IU/L 58 46 56  Total Bilirubin 0.0 - 1.2 mg/dL 0.8 0.8 0.6   CBC Latest Ref Rng & Units 12/18/2016 03/20/2016 07/19/2015  WBC 3.8 - 10.8 K/uL 6.2 6.3 6.0  Hemoglobin 13.2 - 17.1 g/dL 13.0(L) 14.9 14.6  Hematocrit 38.5 - 50.0 % 39.5 44.6 42.3  Platelets 140 - 400 K/uL 149 155 150   Lab Results  Component Value Date   MCV 96.8 12/18/2016   MCV 95.1 03/20/2016   MCV 93.6 07/19/2015   Lab Results  Component Value Date   TSH 1.76 12/18/2016  No results found for: HGBA1C   Lipid Panel      Component Value Date/Time   CHOL 83 12/18/2016 0803   TRIG 93 12/18/2016 0803   HDL 28 (L) 12/18/2016 0803   CHOLHDL 3.0 12/18/2016 0803   VLDL 19 12/18/2016 0803   LDLCALC 36 12/18/2016 0803     RADIOLOGY: No results found.  IMPRESSION:  1. Essential hypertension   2. OSA (obstructive sleep apnea)   3. Grade I diastolic dysfunction   4. Pure hypercholesterolemia   5. Hyperlipidemia LDL  goal <70   6. Amaurosis fugax     ASSESSMENT AND PLAN: Mr. Tadao Emig is an 85 year-old gentleman who has a history of hypertension, hyperlipidemia, intermittent peripheral edema, obstructive sleep apnea, currently on BiPAP, as well as remote history of gout. He has documented bilateral renal cysts and is status post TURP.He has an increased postvoid residual and straight caths himself one time every day.  He sees Dr. [name]  And had a repeat TURP surgery in March 29, 2020.  He will be undergoing cystoscopy in approximately 2 weeks.  He continues to be on Plavix following his remote stroke.  Remotely he had experienced several episodes of amaurosis fugax in his left eye and an MRI of his brain showed mild small vessel ischemic changes and an MRA of  his head and neck and carotid studies were unremarkable.  His blood pressure today is elevated.  With his previously documented grade 1 diastolic dysfunction I have recommended slight titration of spironolactone to 25 mg which will be helpful for blood pressure control as well as volume status.  He continues to be on bisoprolol 10 mg, diltiazem 120 mg, lisinopril 40 mg daily in addition to spironolactone.  I reviewed his most recent bike path download.  He is compliant.  However average use duration is less than 5 hours.  I discussed data regarding potential increased mortality with sleep deprivation.  His AHI is elevated at 14.  His 95th percentile pressure is 23.3/17.3 with a maximum average pressure of 23.7/17.7.  Presently I will increase his pressure support to 7 and will continue his current minimum EPAP setting at 16.  He continues to be on rosuvastatin 20 mg for hyperlipidemia.  I will see him in 6 months for reevaluation or sooner as needed.  Troy Sine, MD, [facility]  11/07/2020 3:53 PM

## 2020-11-07 ENCOUNTER — Encounter: Payer: Self-pay | Admitting: Cardiovascular Disease

## 2020-11-11 NOTE — Addendum Note (Signed)
Addended by: Barrie Dunker on: 11/11/2020 01:07 PM   Modules accepted: Orders

## 2020-11-19 ENCOUNTER — Other Ambulatory Visit: Payer: Self-pay

## 2020-11-19 MED ORDER — METOPROLOL TARTRATE 25 MG PO TABS: ORAL_TABLET | ORAL | 9 refills | Status: DC

## 2020-11-22 ENCOUNTER — Other Ambulatory Visit: Payer: Self-pay | Admitting: Cardiovascular Disease

## 2020-11-23 ENCOUNTER — Other Ambulatory Visit: Payer: Self-pay | Admitting: Cardiovascular Disease

## 2021-01-05 MED ORDER — BISOPROLOL FUMARATE 10 MG PO TABS: 10.0000 mg | ORAL_TABLET | Freq: Every day | ORAL | 3 refills | Status: DC

## 2021-01-31 MED ORDER — SPIRONOLACTONE 25 MG PO TABS: ORAL_TABLET | ORAL | 3 refills | Status: DC

## 2021-03-17 ENCOUNTER — Other Ambulatory Visit: Payer: Self-pay | Admitting: *Deleted

## 2021-03-17 ENCOUNTER — Telehealth: Payer: Self-pay

## 2021-03-17 MED ORDER — SPIRONOLACTONE 25 MG PO TABS: ORAL_TABLET | ORAL | 3 refills | Status: DC

## 2021-03-17 NOTE — Telephone Encounter (Addendum)
Called patient regarding his spironolactone, office received a notice from [facility] wanting to clarify the pt's dose, pt reports he has only been taking 12.5mg  daily because of [facility]'s refusal to refill prescription.  Nurse confirmed with pt that dose should be 25mg  daily, and found the following in Dr. note from 11/05/2020:   With his previously documented grade 1 diastolic dysfunction I have recommended slight titration of spironolactone to 25 mg which will be helpful for blood pressure control as well as volume status.   11/07/2020, RN sent correct rx to pharmacy. Pt updated that his dose had been corrected for pharmacy and he should take the medication as prescribed. Pt verbalized understanding, will call office for any further problems with medication.

## 2021-03-18 ENCOUNTER — Other Ambulatory Visit: Payer: Self-pay

## 2021-03-18 ENCOUNTER — Telehealth: Payer: Self-pay

## 2021-03-18 NOTE — Telephone Encounter (Signed)
Received another fax from [facility] asking for clarification of spironolactone dosage. Dr. Tresa Endo signed form, reporting dose is 25mg  daily. Form faxed back to [facility] with Dr. clarification.

## 2021-03-21 MED ORDER — SPIRONOLACTONE 25 MG PO TABS: 25.0000 mg | ORAL_TABLET | Freq: Every day | ORAL | 3 refills | Status: DC

## 2021-03-21 NOTE — Telephone Encounter (Signed)
Sent corrected rx with correct sig

## 2021-04-14 ENCOUNTER — Encounter: Payer: Self-pay | Admitting: Cardiovascular Disease

## 2021-04-21 ENCOUNTER — Other Ambulatory Visit: Payer: Self-pay

## 2021-04-21 ENCOUNTER — Ambulatory Visit: Payer: Medicare Other | Admitting: Cardiovascular Disease

## 2021-04-21 ENCOUNTER — Encounter: Payer: Self-pay | Admitting: Cardiovascular Disease

## 2021-04-21 ENCOUNTER — Ambulatory Visit (INDEPENDENT_AMBULATORY_CARE_PROVIDER_SITE_OTHER): Payer: Medicare Other | Admitting: Cardiovascular Disease

## 2021-04-21 DIAGNOSIS — E7800 Pure hypercholesterolemia, unspecified: Secondary | ICD-10-CM

## 2021-04-21 DIAGNOSIS — R600 Localized edema: Secondary | ICD-10-CM

## 2021-04-21 DIAGNOSIS — I10 Essential (primary) hypertension: Secondary | ICD-10-CM | POA: Diagnosis not present

## 2021-04-21 DIAGNOSIS — G4733 Obstructive sleep apnea (adult) (pediatric): Secondary | ICD-10-CM

## 2021-04-21 DIAGNOSIS — G453 Amaurosis fugax: Secondary | ICD-10-CM

## 2021-04-21 DIAGNOSIS — N289 Disorder of kidney and ureter, unspecified: Secondary | ICD-10-CM

## 2021-04-21 NOTE — Patient Instructions (Signed)
Medication Instructions:  The current medical regimen is effective;  continue present plan and medications.  *If you need a refill on your cardiac medications before your next appointment, please call your pharmacy*   Follow-Up: At [facility], you and your health needs are our priority.  As part of our continuing mission to provide you with exceptional heart care, we have created designated Provider Care Teams.  These Care Teams include your primary Cardiologist (physician) and Advanced Practice Providers (APPs -  Physician Assistants and Nurse Practitioners) who all work together to provide you with the care you need, when you need it.  We recommend signing up for the patient portal called "MyChart".  Sign up information is provided on this After Visit Summary.  MyChart is used to connect with patients for Virtual Visits (Telemedicine).  Patients are able to view lab/test results, encounter notes, upcoming appointments, etc.  Non-urgent messages can be sent to your provider as well.   To learn more about what you can do with MyChart, go to [email or web address].    Your next appointment:   2 month(s)  The format for your next appointment:   In Person  Provider:   Nicki Guadalajara, MD

## 2021-04-21 NOTE — Progress Notes (Signed)
Patient ID: Kenneth [name], male   DOB: July 19, 1936, 85 y.o.   MRN: [id number]     PCP: [name] at [facility] in [place]  HPI: Kenneth [name] is a 85 y.o. male who presents for an 6 month f/u sleep/cardiology evaluation.  Kenneth [name] has a history of hypertension, hyperlipidemia, gout, and is status post TURP surgery by Dr. [name].  He has a residual underactive bladder and as result straight caths himself one time every day.  He states his post void residuals are in the 250-300 cc range.  He also has  bilateral renal cysts. He has a history of documented moderate obstructive sleep apnea with an AHI of 22.7 and an RDI of 33 and has been on BiPAP Auto therapy with an EPAP minimum of 10, IPAP maximum potential up to 25 with a delta of for which he started in July 2012. 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  Recently, he has had issues with very dry mouth.  He uses a nasal mask.  He has had intermittent use of a chin strap.    An echo Doppler study in 05/2013 revealed normal left ventricle wall thickness with normal systolic and diastolic function. He did have mild aortic regurgitation due to poor central leaflet coaptation. There was borderline prolapse of his anterior mitral valve leaflet with trivial mitral regurgitation. He had normal pulmonary pressures assessment artery systolic pressure 19 mm.  He has had issues in the past with peripheral edema, which had led to an increased dose of his torsemide last year.  He denies exertionally precipitated chest pain.  He did experience one episode of some chest fullness or night when he was sitting on his bed, but this ultimately resolved, and he has not had any further sensation He is unaware of palpitations.  He denies presyncope or syncope.  In December 2015, he complained of experiencing transient episodes of a field defect in the lateral left eye.  These were both self-limiting.  He saw an ophthalmologist  who recommended that he notify me concerning the symptoms  His history is notable in that he did sustain to prior head injuries many years ago.  Once when he was in high school and had a severe concussion.  Another time occurred when he fell out of a tree stand when he was hunting.  I subtotal referred him to [facility] neurology.  Due to my concern for possible amaurosis fugax.  He saw Dr. [name], who felt that the patient had experienced at least 3 episodes of amaurosis fugax in his left eye.  He underwent an MRI of the brain that showed mild small vessel ischemic changes in the MRA of the head and carotid Doppler studies which were unremarkable.  He was taken off aspirin and started on Plavix 75 mg.  He denies any recurrent symptomatology.  His primary physician is Dr. [name] in [place].   He has a history of hyperlipidemia and is tolerating Crestor 20 mg.  He has been on amlodipine 5 mg, Bystolic 20 mg, and lisinopril 40 mg for hypertension.  He was found to have increasing PSA levels with his levels rising from 1.5  to 7 and saw Dr. [name] for biopsy. He underwent cholecystectomy and at that time his ECG showed a PR interval at 212 ms, which was of concern by the anesthesiologist.  He tolerated surgery well without cardiovascular compromise.   In 2017 he had noticed a change in development  of shortness of breath with less activity.  He denied associated chest tightness and was experiencing shortness of breath with walking less than 100 yards.  This has been ongoing for several months.  He had noticed some trace swelling in his ankles.  A nuclear stress test which was done on 04/11/2016.  Ejection fraction was 59%.  There were no ECG changes.  There was normal perfusion.  An echo Doppler study on 04/12/2016 showed mild LVH with normal systolic function with very mild grade 1 diastolic dysfunction.  There was mild aortic insufficiency and trivial TR and PR.  Pulmonary pressures were  normal.  From a sleep perspective, he continues to use his BiPAP therapy.  He admits to his mouth getting dry during the day.  He has a nasal mask.  He may be having oral venting.  In the past.  He was given a chin strap but has not been consistently using this.  He has not had any recent download.  He believes his sleep initiation is good but he is having difficulty with sleep maintenance. He uses [name] for his DME company.    He had experienced swelling in his lower extremity and a lower extremity Doppler study was negative for DVT in [place].  He was able to walk a mile without significant abnormalities, but when he has to carry his garbage can to the curb he notes shortness of breath.    Since his office visit in April 2018, he has continued to be without chest pain.  There is no change in his previous shortness of breath.  I had attempted to initiate spironolactone with his diastolic dysfunction but apparently during my absence heat, cold, the office and was concerned that he had not noticed any significant change.  He was advised to go back and take one half of his previous dose of torsemide and one half of spironolactone.  He has been doing this and actually he has felt somewhat better.  He continues to take lisinopril 40 mg, Bystolic 20 mg, and amlodipine for hypertension.  He denies significant swelling.  He is on Crestor for hyperlipidemia.   When I saw him in October 2018, he had received a new BiPAP machine, ResMed air curve 10 feet auto.  His set up date was 07/06/2017.  His minimum EPAP pressure is 9 with a maximum IPAP pressure of 25.  In the office today.  I obtained a download from 08/12/2017 through 09/10/2017.  He was 100% compliant with usage stays and usage greater than 4 hours was 97%.  He is averaging almost 6 hours and 30 minutes of sleep per night.  AHI is excellent at 1.2 and his 95th percentile pressure was 14.7/10.8 with a maximum average pressure of 15.8/11.8.  He has  noticed significant improvement with his new machine.  He is sleeping well.  When I saw him in January 2019 he was experiencing rare palpitations which he described as a chest flutter, which may last up to 30 seconds.  He has noticed this rarely every 2-3 months and typically there is an abrupt onset and abrupt cessation. Marland Kitchen  He denies presyncope or syncope.  He denies associated chest tightness.  At that office visit, I discontinued amlodipine and change this to Cardizem CD 120 mg at night and also gave a prescription from a told metoprolol tartrate to take 12.5 to 25 mg as needed.  I also discussed improved sleep duration at all possible.  At his evaluation in June 2019  his major complaint is that of fatigue well as exertional dyspnea.  He denied chest pain, swelling or awareness of palpitations.  He continued to be on Bystolic 20 mg, diltiazem CD 120 daily, lisinopril 40 mg daily, in addition to low-dose spironolactone 12.5 mg and torsemide just 5 mg.  Continues to use BiPAP and a download from Dec 22, 2017 through January 20, 2018 shows 100% compliance.  However, sleep duration was only 5 hours and 45 minutes.  95th percentile pressure was 15/11 with a maximum pressure 16/12.  AHI is excellent at 1.0.    I  evaluated him on December 11, 2018 in a telemedicine visit.  His blood pressure was controlled.  I reviewed a recent download which revealed 100% compliance with BiPAP usage however he was not using therapy for long enough duration and was only averaging 3 hours and 14 minutes.  His AHI was elevated at 20.3 on an EPAP minimum of 9 and IPAP maximum of 25.  I increase his EPAP minimum to 11 and further increased his pressure support to 5.  He was walking several days per week.  His echo study had previously demonstrated grade 1 diastolic dysfunction.  Coronary CT had noticed coronary calcification and for this reason I continued him on rosuvastatin at 0 mg attempt to induce LDL reduction to the 50s or  below.  He had undergone laboratory by his primary provider and LDL cholesterol was excellent at 37.  Primary provider question whether or not his dose of rosuvastatin should be decreased to 20 mg down to 10 mg..  He has been taking rosuvastatin 20 mg daily  When I  saw him in October 2019 he continued to experience fatigability.  He was continued to have issues with diarrhea and some abdominal pain and has been seeing Dr. Christoper Fabian at [facility].  He was to undergo an abdominal vascular MRI.  He denied any chest tightness or pressure.  He was continuing to use BiPAP typically goes to bed between 10 and 1030 and wakes up around 6:30 AM.  A download in July 2019 showed 100% usage with an AHI of 4.5 he continues to experience some fatigue.  He has been evaluated by Dr. Christoper Fabian at [facility] with pain and diarrhea.  He tells me he will be undergoing an abdominal vascular MRI.  He also is status post endoscopy.  He denies chest pain.  At times he notes shortness of breath particularly if he has to [place] b but a subsequent download in September 2019 showed increased AHI at 9.9 and was having some significant mask issues.  Presently, he has been on diltiazem 120 mg daily, lisinopril 40 mg, Bystolic 10 mg, in addition to Spironolactone 12.5 mg.  He also is on rosuvastatin 20 mg for hyperlipidemia.  His primary physician has been checking laboratory.  Remotely his LDL cholesterols have consistently been excellent and less than 60.   He was evaluated by Almyra Deforest, PAC in February 2020.  Of note, he had undergone an abdominal CT and this had demonstrated mild coronary artery calcification.  He was seen by his primary care provider Dr. [name] early February 2020 and his blood pressure was 106/56.  He was feeling well and was asymptomatic.   I evaluated him in a telemedicine visit on December 11, 2018.  A download was obtained  of his  BiPAP unit from March 30 through December 10, 2018.  Usage was 100%.  However, daily usage declined to  only 3 hours and  14 minutes and only 10 out of 30 days with usage greater than 4 hours.  He apparently has a full facemask.  He never received the air fit F 30 little I that we had recommended but has a new full facemask covering his nose.  On his most recent download he did not have any significant leak.  He is set at an EPAP minimum pressure of 9 with an IPAP maximum pressure of 25 and a pressure support of 4.  AHI, however is elevated at 20.3 with an apnea index of 20.1.  95th percentile pressure was 22.1/18.1.  Typically he has been taking the mask off after he goes to the bathroom sometime during the night and typically has not put it on consistently.  He also has had some issues with dry mouth.    During that evaluation I adjusted his BiPAP therapy and increased EPAP minimum to 11 and increase pressure support to 5 such that his initial pressure will be 16/11 with potential titration up to 25/20.  Gust the importance of improved sleep duration and using CPAP for the preponderance of the night.  I saw him on August 07, 2019 and since his prior evaluation he had seen Dr. [name], his primary physician who I checked laboratory. Laboratory  showed an LDL cholesterol at 37 and his primary physician was wondering if his rosuvastatin dose should be reduced 20 down to 10 mg.  Prior CT did demonstrate coronary calcification as well as suggested a possible small pericardial effusion.  He has continued to use BiPAP and a new download from July 08, 2019 through August 06, 2019 shows 100% usage and usage greater than 4 hours at 73%.  Average usage on days used was 4 hours and 43 minutes.  He has issues with dry mouth.  AHI was improved from previously but still elevated at 10.3.  His 95th percentile pressure was 22.4/17.4 with a maximum average pressure 23.4/18.5.  He continues to see Dr. [name] for his urologic issues.  There is strong family history of prostate CA and the patient requires self cathing on a daily  basis.  He denies chest pain.  Is admit to a vague chest sensation if he bends over.  He denies any chest pain or shortness of breath with cutting his grass with a walking lawnmower.  He underwent a follow-up echo Doppler study on August 26, 2019 which showed an EF of 50 to 55% and grade 1 diastolic dysfunction.  There was mild to moderate aortic valve sclerosis without stenosis and mild aortic insufficiency.  There is mild dilation of his aortic root measuring 40 mm.    I  saw him in July 2021 and over the prior 7 months he continued to do well. He  had issues with chronic pain and will need to undergo epidural injection scheduled tentatively for March 23, 2020.  He was to undergo repeat TURP surgery with Dr. [name] on August 16.  He has noticed some intermittent ankle swelling despite taking spironolactone 12.5 mg daily.  He has continued to use BiPAP therapy.  He does have some issues with sleep maintenance.  Download was obtained in the office today from June 20 through March 01, 2020 which shows 100% compliance.  However he usage duration is only 4 hours and 29 minutes.  AHI is elevated at 11.4 with an apnea index of 11.1 and central index of 4.4.  His 95th percentile pressure is 23.4/17.4 with a maximum average pressure 24/18 cm of  water.  I last saw him on November 05, 2020 and since his prior evaluation he denied any episodes of chest pain or shortness of breath.  His blood pressure has been mildly elevated and he has been on lisinopril 40 mg daily, bisoprolol 10 mg, diltiazem 120 mg, and spironolactone 12.5 mg daily.  He has a prescription for metoprolol tartrate to take 12.5 or 25 mg as needed for palpitations.  He is scheduled to undergo a cystoscopy with Dr. [name] in 2 weeks.  He has been undergoing daily straight caths.  He has continued to use CPAP therapy and a download was obtained from February 23 through November 04, 2020.  He is compliant with usage days and average usage is 4 hours and 53  minutes.  Current settings include a minimum EPAP of 16 with maximum IPAP of 25 and pressure support of 6.  He continues to have complex events with an AHI of 14 and has both obstructive and central apnea.  He denies shortness of breath.  For that evaluation, I recommended slight increase in his pressure support up to 7 cm of water to be beneficial in improving his obstructive and central events.  Since I last saw him, he has remained stable from a cardiac standpoint and he denies any chest pain or shortness of breath.  His blood pressure has been controlled on bisoprolol 10 mg, diltiazem 120 mg, lisinopril 40 mg and has been taking metoprolol tartrate on a as needed basis for palpitations.  Over the last 6 months he only took metoprolol on 2 occasions.  At times he admits to some edema left ankle greater than right.  He continues to be followed by Dr. [name].  Edema has improved with increase in spironolactone dose to 25 mg daily.  Gone recent laboratory and creatinine was minimally increased at 1.32 with GFR in the upper 50s consistent with stage IIIa CKD.  From a sleep perspective, he continues to use his BiPAP with excellent compliance.  I obtained a download from August 9 through April 20, 2021.  Currently he is set at a minimum EPAP of 16, maximum IPAP of 25, and pressure support of 7.  His 95th percentile maximum pressures are ranging around 24/17.  AHI continues to be increased at 14.3 with 6.9 central apneic events and 7.2 obstructive apneic events.  He has had issues with dry mouth.  He has some unification setting is at auto.  He presents for reevaluation.   Past Medical History:  Diagnosis Date   Amaurosis fugax 09/16/4823   Diastolic dysfunction    grade 1 by echo 4/11   Dyslipidemia    HTN (hypertension)    Sleep apnea    on BiPap    Past Surgical History:  Procedure Laterality Date   CATARACT EXTRACTION Bilateral    CHOLECYSTECTOMY     SEPTOPLASTY  2000   TONSILLECTOMY      TRANSURETHRAL RESECTION OF PROSTATE  1997    No Known Allergies  Current Outpatient Medications  Medication Sig Dispense Refill   allopurinol (ZYLOPRIM) 100 MG tablet Take 200 mg by mouth daily.      bisoprolol (ZEBETA) 10 MG tablet Take 1 tablet (10 mg total) by mouth daily. 90 tablet 3   Cholecalciferol 25 MCG (1000 UT) tablet Take 1,000 Units by mouth daily.     clopidogrel (PLAVIX) 75 MG tablet TAKE 1 TABLET BY MOUTH  DAILY 90 tablet 3   diltiazem (CARDIZEM CD) 120 MG 24 hr capsule TAKE  1 CAPSULE(120 MG) BY MOUTH DAILY 90 capsule 2   ferrous sulfate 325 (65 FE) MG EC tablet Take 325 mg by mouth daily.     fish oil-omega-3 fatty acids 1000 MG capsule Take 1,200 mg by mouth daily.      lisinopril (ZESTRIL) 40 MG tablet TAKE 1 TABLET BY MOUTH  DAILY 90 tablet 3   loratadine (CLARITIN) 10 MG tablet Take 10 mg by mouth daily.     Melatonin 3 MG TABS Take 1 tablet by mouth at bedtime as needed.     metoprolol tartrate (LOPRESSOR) 25 MG tablet TAKE 1/2 TO 1 TABLET BY MOUTH AS NEEDED FOR PALPITATIONS 30 tablet 9   rosuvastatin (CRESTOR) 20 MG tablet TAKE 1 TABLET BY MOUTH  DAILY 90 tablet 3   spironolactone (ALDACTONE) 25 MG tablet Take 1 tablet (25 mg total) by mouth daily. Take 25 mg (1 tablet) 90 tablet 3   tamsulosin (FLOMAX) 0.4 MG CAPS Take 0.4 mg by mouth daily after supper.     testosterone cypionate (DEPOTESTOSTERONE CYPIONATE) 200 MG/ML injection Inject 400 mg into the muscle every 30 (thirty) days.     Zinc 30 MG TABS Take by mouth.     No current facility-administered medications for this visit.    Social History   Socioeconomic History   Marital status: Married    Spouse name: Collie Siad   Number of children: 3   Years of education: MD   Highest education level: Not on file  Occupational History   Occupation: Retired  Tobacco Use   Smoking status: Former   Smokeless tobacco: Never  Substance and Sexual Activity   Alcohol use: No   Drug use: No   Sexual activity: Not on  file  Other Topics Concern   Not on file  Social History Narrative   Lives at home w/ his wife   Patient is right handed.   Patient drinks 2 cups caffeine daily.   Social Determinants of Health   Financial Resource Strain: Not on file  Food Insecurity: Not on file  Transportation Needs: Not on file  Physical Activity: Not on file  Stress: Not on file  Social Connections: Not on file  Intimate Partner Violence: Not on file    Family History  Problem Relation Age of Onset   Alzheimer's disease Mother 75   Emphysema Father    Socially he is married and has 3 children, 14 grandchildren and 3 great-grandchildren. There is no tobacco or alcohol use.  ROS General: Negative; No fevers, chills, or night sweats;  HEENT: Positive for 2 transient episodes over the past year with left lateral eye field defect, sinus congestion, difficulty swallowing Pulmonary: Negative; No cough, wheezing, shortness of breath, hemoptysis Cardiovascular: See history of present illness GI: Negative; No nausea, vomiting, diarrhea, or abdominal pain GU: Negative; No dysuria, hematuria, or difficulty voiding Musculoskeletal: Positive for history of gout Hematologic/Oncology: Negative; no easy bruising, bleeding Endocrine: Negative; no heat/cold intolerance; no diabetes Neuro: Positive for h/o amaurosis fugax; resolved since Plavix was instituted Skin: Negative; No rashes or skin lesions Psychiatric: Negative; No behavioral problems, depression Sleep: Positive for sleep apnea.  With BiPAP therapy he is unaware of any breakthrough snoring, daytime sleepiness, hypersomnolence, bruxism, restless legs, hypnogognic hallucinations, no cataplexy Other comprehensive 14 point system review is negative.   PE BP 115/60   Pulse (!) 58   Ht 6' (1.829 m)   Wt 186 lb 3.2 oz (84.5 kg)   SpO2 97%   BMI 25.25  kg/m    Repeat blood pressure by me was 120/68  Wt Readings from Last 3 Encounters:  04/21/21 186 lb 3.2 oz  (84.5 kg)  11/05/20 189 lb 3.2 oz (85.8 kg)  07/26/20 185 lb 9.6 oz (84.2 kg)   General: Alert, oriented, no distress.  Skin: normal turgor, no rashes, warm and dry HEENT: Normocephalic, atraumatic. Pupils equal round and reactive to light; sclera anicteric; extraocular muscles intact;  Nose without nasal septal hypertrophy Mouth/Parynx benign; Mallinpatti scale 3 Neck: No JVD, no carotid bruits; normal carotid upstroke Lungs: clear to ausculatation and percussion; no wheezing or rales Chest wall: without tenderness to palpitation Heart: PMI not displaced, RRR, s1 s2 normal, 1/6 systolic murmur, no diastolic murmur, no rubs, gallops, thrills, or heaves Abdomen: soft, nontender; no hepatosplenomehaly, BS+; abdominal aorta nontender and not dilated by palpation. Back: no CVA tenderness Pulses 2+ Musculoskeletal: full range of motion, normal strength, no joint deformities Extremities: no clubbing cyanosis or edema, Homan's sign negative  Neurologic: grossly nonfocal; Cranial nerves grossly wnl Psychologic: Normal mood and affect  April 21, 2021 ECG (independently read by me): Sinus bradycardia 58 bpm with first-degree AV block..  PR 224 ms  November 05, 2020 ECG (independently read by me): Sinus bradycardia at 59, 1st degree block; PR 208 msec  July 2021 ECG (independently read by me): Sinus bradycardia 52 bpm.  No ectopy.  PR interval 206 ms  August 07, 2019 ECG (independently read by me): Sinus rhythm at 63 bpm with first-degree AV block with a PR 218 ms.  No ectopy.  October 2019 ECG (independently read by me): Sinus bradycardia with first-degree AV block.  PR interval 214 ms.  No ST segment changes.  No ectopy.  June 2019 ECG (independently read by me): Sinus bradycardia at 50 bpm.  First-degree AV block.  No significant ST segment changes.  January 2019 ECG (independently read by me): Sinus bradycardia 59 bpm.  Borderline first degree AV block with a PR interval 202 ms; QTC  normal at 394 ms.  No ectopy.  April 2018 ECG (independently read by me): Normal sinus rhythm at 60, normal intervals.  No ST segment changes  October 2017 ECG (independently read by me): Sinus bradycardia at 57 bpm.  PR interval 200 ms.  QTc interval 395 ms.  July 2017 ECG (independently read by me): Normal sinus rhythm at 61 bpm.  Normal QTc with borderline first-degree AV block  November 2016 ECG (independently read by me): Normal sinus rhythm at 60 bpm.  PR interval 200 ms.  June 2016 ECG (independently read by me): Sinus rhythm at 63.  Intervals normal.  No ST segment changes  December 2015 ECG (independently read by me): Sinus bradycardia 59 bpm.  Mild first-degree AV block with a PR interval 212 ms.  QTc interval normal.  No ST segment changes.  June 2015 ECG (independently read by me): sinus rhythm at 59 beats per minute.  Borderline first degree AV block with a PR interval of 204 ms.  ECG: Sinus rhythm at 61 beats per minute. Borderline first degree AV block with PR interval 200 ms. QT interval normal 388 ms.  LABS: BMP Latest Ref Rng & Units 04/16/2020 12/18/2016 03/20/2016  Glucose 65 - 99 mg/dL 96 95 97  BUN 8 - 27 mg/dL _0 Creatinine 0.76 - 1.27 mg/dL 1.24 1.05 1.22(H)  BUN/Creat Ratio 10 - 24 15 - -  Sodium 134 - 144 mmol/L 133(L) 142 143  Potassium 3.5 - 5.2  mmol/L 4.6 4.1 4.2  Chloride 96 - 106 mmol/L 100 110 104  CO2 20 - 29 mmol/L _0 Calcium 8.6 - 10.2 mg/dL 8.9 8.5(L) 9.0   Hepatic Function Latest Ref Rng & Units 04/16/2020 12/18/2016 03/20/2016  Total Protein 6.0 - 8.5 g/dL 6.0 6.1 6.8  Albumin 3.6 - 4.6 g/dL 3.7 3.6 3.9  AST 0 - 40 IU/L _1 ALT 0 - 44 IU/L _2 Alk Phosphatase 48 - 121 IU/L 58 46 56  Total Bilirubin 0.0 - 1.2 mg/dL 0.8 0.8 0.6   CBC Latest Ref Rng & Units 12/18/2016 03/20/2016 07/19/2015  WBC 3.8 - 10.8 K/uL 6.2 6.3 6.0  Hemoglobin 13.2 - 17.1 g/dL 13.0(L) 14.9 14.6  Hematocrit 38.5 - 50.0 % 39.5 44.6 42.3  Platelets 140 - 400  K/uL 149 155 150   Lab Results  Component Value Date   MCV 96.8 12/18/2016   MCV 95.1 03/20/2016   MCV 93.6 07/19/2015   Lab Results  Component Value Date   TSH 1.76 12/18/2016  No results found for: HGBA1C   Lipid Panel      Component Value Date/Time   CHOL 83 12/18/2016 0803   TRIG 93 12/18/2016 0803   HDL 28 (L) 12/18/2016 0803   CHOLHDL 3.0 12/18/2016 0803   VLDL 19 12/18/2016 0803   LDLCALC 36 12/18/2016 0803     RADIOLOGY: No results found.  IMPRESSION:  1. OSA (obstructive sleep apnea)   2. Essential hypertension   3. Edema of left lower leg   4. Renal insufficiency   5. Pure hypercholesterolemia   6. Amaurosis fugax     ASSESSMENT AND PLAN: Mr. Romie Tay is an 85  year-old gentleman who has a history of hypertension, hyperlipidemia, intermittent peripheral edema, obstructive sleep apnea, currently on BiPAP, as well as remote history of gout. He has documented bilateral renal cysts and is status post TURP.He has an increased postvoid residual and straight caths himself one time every day.  He sees Dr. [name] and had a repeat TURP surgery in March 29, 2020.  He continues to be on Plavix following his remote stroke.  Remotely he had experienced several episodes of amaurosis fugax in his left eye and an MRI of his brain showed mild small vessel ischemic changes and an MRA of his head and neck and carotid studies were unremarkable.  When I saw him in March 2022 his blood pressure was elevated, and with his previously documented grade 1 diastolic dysfunction I recommended slight additional titration of spironolactone to 25 mg which would be helpful both for blood pressure control and edema.  He has had issues with ankle edema left greater than right.  This improved with increase spironolactone dosing.  His blood pressure today is excellent on his regimen consisting of 10 mg, diltiazem 120 mg, lisinopril 40 mg, 25 mg.  He continues to be on Plavix following his stroke.  I  reviewed his most recent download on his BiPAP therapy.  Spite fairly optimal treatment with his BiPAP unit with a 95th percentile average at 24/17 and pressure support of 7, he continues to have complex apneic events with an AHI of 14.3.  He has documented normal LV function.  As result I believe he would benefit from ASV titration study and hopefully this will allow for control of his continued central events.  We did make adjustments in his humidification.  [employer] is his DME company.  I discussed with him  the importance of using therapy ideally up to 7 to 8 hours per night if at all possible rather than his average use which remains 4-hour range.  I will see him following his ASV titration probable new device for subsequent evaluation.     Troy Sine, MD, [facility]  04/21/2021 3:18 PM

## 2021-04-28 ENCOUNTER — Telehealth: Payer: Self-pay | Admitting: *Deleted

## 2021-04-28 ENCOUNTER — Other Ambulatory Visit: Payer: Self-pay | Admitting: Cardiovascular Disease

## 2021-04-28 DIAGNOSIS — G4733 Obstructive sleep apnea (adult) (pediatric): Secondary | ICD-10-CM

## 2021-04-28 NOTE — Telephone Encounter (Signed)
-----   Message from Darene Lamer, LPN sent at 04/16/2670 10:12 AM EDT ----- TK wanted a ASV titration.   Thank you!

## 2021-04-28 NOTE — Telephone Encounter (Signed)
Patient notified of ASV titration appointment.

## 2021-05-11 ENCOUNTER — Ambulatory Visit (HOSPITAL_BASED_OUTPATIENT_CLINIC_OR_DEPARTMENT_OTHER): Payer: Medicare Other | Attending: Cardiovascular Disease | Admitting: Cardiovascular Disease

## 2021-05-11 ENCOUNTER — Other Ambulatory Visit: Payer: Self-pay

## 2021-05-11 DIAGNOSIS — Z7901 Long term (current) use of anticoagulants: Secondary | ICD-10-CM | POA: Diagnosis not present

## 2021-05-11 DIAGNOSIS — I493 Ventricular premature depolarization: Secondary | ICD-10-CM | POA: Diagnosis not present

## 2021-05-11 DIAGNOSIS — Z79899 Other long term (current) drug therapy: Secondary | ICD-10-CM | POA: Insufficient documentation

## 2021-05-11 DIAGNOSIS — Z7902 Long term (current) use of antithrombotics/antiplatelets: Secondary | ICD-10-CM | POA: Diagnosis not present

## 2021-05-11 DIAGNOSIS — G4733 Obstructive sleep apnea (adult) (pediatric): Secondary | ICD-10-CM

## 2021-05-16 ENCOUNTER — Other Ambulatory Visit: Payer: Self-pay

## 2021-05-16 ENCOUNTER — Other Ambulatory Visit: Payer: Self-pay | Admitting: Cardiovascular Disease

## 2021-05-16 MED ORDER — DILTIAZEM HCL ER COATED BEADS 120 MG PO CP24: ORAL_CAPSULE | ORAL | 3 refills | Status: DC

## 2021-06-01 ENCOUNTER — Encounter (HOSPITAL_BASED_OUTPATIENT_CLINIC_OR_DEPARTMENT_OTHER): Payer: Self-pay | Admitting: Cardiovascular Disease

## 2021-06-01 NOTE — Procedures (Signed)
Patient Name: Kenneth [name], Kenneth [name] Date: 05/11/2021 Gender: Male D.O.B: 03-Jun-1936 Age (years): 89 Referring Provider: Nicki Guadalajara MD, ABSM Height (inches): 72 Interpreting Physician: Nicki Guadalajara MD, ABSM Weight (lbs): 185 RPSGT: Shelah Lewandowsky BMI: 25 MRN: [id number] Neck Size: 15.50   CLINICAL INFORMATION The patient is referred for a adaptive servo-ventilator titration study.  Most recent polysomnogram dated 01/16/2012 revealed an AHI of 22.7/h and RDI of 33/h. Most recent titration study dated 01/16/2012 was optimal at 14.10cm H2O with an AHI of 4/h. He did not tolerate CPAP due to central events, and continued to have complex apnea with BiPAP.  MEDICATIONS allopurinol (ZYLOPRIM) 100 MG tablet bisoprolol (ZEBETA) 10 MG tablet Cholecalciferol 25 MCG (1000 UT) tablet clopidogrel (PLAVIX) 75 MG tablet diltiazem (CARDIZEM CD) 120 MG 24 hr capsule ferrous sulfate 325 (65 FE) MG EC tablet fish oil-omega-3 fatty acids 1000 MG capsule lisinopril (ZESTRIL) 40 MG tablet loratadine (CLARITIN) 10 MG tablet Melatonin 3 MG TABS metoprolol tartrate (LOPRESSOR) 25 MG tablet rosuvastatin (CRESTOR) 20 MG tablet spironolactone (ALDACTONE) 25 MG tablet tamsulosin (FLOMAX) 0.4 MG CAPS testosterone cypionate (DEPOTESTOSTERONE CYPIONATE) 200 MG/ML injection Zinc 30 MG TABS Medications self-administered by patient taken the night of the study : CLOPIDOGREL, DILTIAZEM HCL, TAMSULOSIN HYDROCHLORIDE, Rosuvastatin  SLEEP STUDY TECHNIQUE As per the AASM Manual for the Scoring of Sleep and Associated Events v2.3 (April 2016) with a hypopnea requiring 4% desaturations.  The channels recorded and monitored were frontal, central and occipital EEG, electrooculogram (EOG), submentalis EMG (chin), nasal and oral airflow, thoracic and abdominal wall motion, anterior tibialis EMG, snore microphone, electrocardiogram, and pulse oximetry.  RESPIRATORY PARAMETERS Optimal Min IPAP (cm): 5 Optimal Max  IPAP (cm): 14 Optimal Min EPAP (cm): 5 Optimal Max EPAP (cm): 14 Optimal Max Pressure (cm): 4 Optimal Min PS (cm): 4 Optimal Max PS (cm): 4 Opitmal Breathing Rate (/min): Auto Overall Min O2 (%): 84.0 Min O2 at Optimal Pressure (%): 84.0 AHI at Optimal (/hr): N/A   SLEEP ARCHITECTURE During a recording time of 406.3 minutes, the patient slept for 129.5 minutes. Sleep efficiency was 31.9%%. The patient spent 18.5%% of the night in stage N1 sleep, 81.5%% in stage N2 sleep, 0.0%% in stage N3 and 0% in REM. Wake after sleep onset (WASO) was 256.6 minutes. Alpha intrusion was PRESENT ABSENT. Supine sleep was 100.00%. The arousal index was 31.5.  LEG MOVEMENT DATA PLM Index (/hr): 0.0 PLM Arousal Index (/hr): 0.0  CARDIAC DATA The 2 lead EKG demonstrated sinus rhythm. The mean heart rate was 63.1 beats per minute. Other EKG findings include: PVCs.  IMPRESSIONS - Severe obstructive sleep apnea occurred during this study (AHI 37.5/h; RDI 42.2/h). An optimal ASV pressure was selected. - Moderate oxygen desaturation to a nadir of 84.0%. - The patient snored for % of sleep with moderate snoring volume. - EKG findings include PVCs. - Clinically significant periodic limb movements did not occur during sleep.  DIAGNOSIS - Obstructive Sleep Apnea (G47.33)  RECOMMENDATIONS - Recommend an initial trial of SV Advance EPAP Min 7 and Max 14 cmH2O, Pressure Support Min 4 and Max 11 cmH2O with Max IPAP Pressure of 25 cmH2O and Breath Rate of Auto BrPM. - Effort should be made to optimize nasal and oropharyngeal patency.  - Avoid alcohol, sedatives and other CNS depressants that may worsen sleep apnea and disrupt normal sleep architecture. - Sleep hygiene should be reviewed to assess factors that may improve sleep quality. - Weight management and regular exercise should be initiated or continued. -  Recommend a download in 30 days and sleep clinic evaluation after 4 weeks of therapy  [Electronically signed]  06/01/2021 04:59 PM  Nicki Guadalajara MD, [facility], ABSM Diplomate, American Board of Sleep Medicine   NPI: [id number] [facility] PH: [phone]   FX: [phone] ACCREDITED BY THE AMERICAN ACADEMY OF SLEEP MEDICINE

## 2021-06-09 ENCOUNTER — Telehealth: Payer: Self-pay | Admitting: *Deleted

## 2021-06-09 NOTE — Telephone Encounter (Signed)
Orders for ASV sent to [employer] via [employer] portal.

## 2021-06-19 ENCOUNTER — Encounter (HOSPITAL_BASED_OUTPATIENT_CLINIC_OR_DEPARTMENT_OTHER): Payer: Medicare Other | Admitting: Cardiovascular Disease

## 2021-06-20 ENCOUNTER — Other Ambulatory Visit: Payer: Self-pay

## 2021-06-20 ENCOUNTER — Ambulatory Visit: Payer: Medicare Other | Admitting: Cardiovascular Disease

## 2021-07-13 ENCOUNTER — Encounter: Payer: Self-pay | Admitting: Cardiovascular Disease

## 2021-08-16 NOTE — Telephone Encounter (Signed)
I have not received any paperwork on him regarding a sleep compliance appointment. I've reached out to him regarding his set up date but have not heard back yet. Will follow up again with him tomorrow.

## 2021-08-21 ENCOUNTER — Other Ambulatory Visit: Payer: Self-pay | Admitting: Cardiovascular Disease

## 2021-08-24 ENCOUNTER — Other Ambulatory Visit: Payer: Self-pay | Admitting: Cardiovascular Disease

## 2021-08-31 ENCOUNTER — Encounter: Payer: Self-pay | Admitting: Cardiovascular Disease

## 2021-09-23 ENCOUNTER — Other Ambulatory Visit: Payer: Self-pay

## 2021-09-23 ENCOUNTER — Ambulatory Visit (INDEPENDENT_AMBULATORY_CARE_PROVIDER_SITE_OTHER): Payer: Medicare Other | Admitting: Cardiovascular Disease

## 2021-09-23 DIAGNOSIS — G453 Amaurosis fugax: Secondary | ICD-10-CM | POA: Diagnosis not present

## 2021-09-23 DIAGNOSIS — I5189 Other ill-defined heart diseases: Secondary | ICD-10-CM

## 2021-09-23 DIAGNOSIS — E785 Hyperlipidemia, unspecified: Secondary | ICD-10-CM | POA: Diagnosis not present

## 2021-09-23 DIAGNOSIS — G4733 Obstructive sleep apnea (adult) (pediatric): Secondary | ICD-10-CM

## 2021-09-23 DIAGNOSIS — E7800 Pure hypercholesterolemia, unspecified: Secondary | ICD-10-CM

## 2021-09-23 DIAGNOSIS — G4731 Primary central sleep apnea: Secondary | ICD-10-CM | POA: Diagnosis not present

## 2021-09-23 DIAGNOSIS — I10 Essential (primary) hypertension: Secondary | ICD-10-CM | POA: Diagnosis not present

## 2021-09-23 NOTE — Progress Notes (Signed)
Patient ID: Kenneth [name], male   DOB: 06/29/1936, 86 y.o.   MRN: [id number]     PCP: Kenneth [name] at [facility] in [place]  HPI: Kenneth [name] is a 86 y.o. male who presents for an 5 month f/u sleep/cardiology evaluation.  Kenneth [name] has a history of hypertension, hyperlipidemia, gout, and is status post TURP surgery by Dr. Amalia [name].  He has a residual underactive bladder and as result straight caths himself one time every day.  He states his post void residuals are in the 250-300 cc range.  He also has  bilateral renal cysts. He has a history of documented moderate obstructive sleep apnea with an AHI of 22.7 and an RDI of 33 and has been on BiPAP Auto therapy with an EPAP minimum of 10, IPAP maximum potential up to 25 with a delta of for which he started in July 2012. 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  Recently, he has had issues with very dry mouth.  He uses a nasal mask.  He has had intermittent use of a chin strap.    An echo Doppler study in 05/2013 revealed normal left ventricle wall thickness with normal systolic and diastolic function. He did have mild aortic regurgitation due to poor central leaflet coaptation. There was borderline prolapse of his anterior mitral valve leaflet with trivial mitral regurgitation. He had normal pulmonary pressures assessment artery systolic pressure 19 mm.  He has had issues in the past with peripheral edema, which had led to an increased dose of his torsemide last year.  He denies exertionally precipitated chest pain.  He did experience one episode of some chest fullness or night when he was sitting on his bed, but this ultimately resolved, and he has not had any further sensation He is unaware of palpitations.  He denies presyncope or syncope.  In December 2015, he complained of experiencing transient episodes of a field defect in the lateral left eye.  These were both self-limiting.  He saw an ophthalmologist  who recommended that he notify me concerning the symptoms  His history is notable in that he did sustain to prior head injuries many years ago.  Once when he was in high school and had a severe concussion.  Another time occurred when he fell out of a tree stand when he was hunting.  I subtotal referred him to [facility] neurology.  Due to my concern for possible amaurosis fugax.  He saw Kenneth [name], who felt that the patient had experienced at least 3 episodes of amaurosis fugax in his left eye.  He underwent an MRI of the brain that showed mild small vessel ischemic changes in the MRA of the head and carotid Doppler studies which were unremarkable.  He was taken off aspirin and started on Plavix 75 mg.  He denies any recurrent symptomatology.  His primary physician is Dr. Janna [name] in [place].   He has a history of hyperlipidemia and is tolerating Crestor 20 mg.  He has been on amlodipine 5 mg, Bystolic 20 mg, and lisinopril 40 mg for hypertension.  He was found to have increasing PSA levels with his levels rising from 1.5  to 7 and saw Dr. Amalia [name] for biopsy. He underwent cholecystectomy and at that time his ECG showed a PR interval at 212 ms, which was of concern by the anesthesiologist.  He tolerated surgery well without cardiovascular compromise.   In 2017 he had noticed a change in development  of shortness of breath with less activity.  He denied associated chest tightness and was experiencing shortness of breath with walking less than 100 yards.  This has been ongoing for several months.  He had noticed some trace swelling in his ankles.  A nuclear stress test which was done on 04/11/2016.  Ejection fraction was 59%.  There were no ECG changes.  There was normal perfusion.  An echo Doppler study on 04/12/2016 showed mild LVH with normal systolic function with very mild grade 1 diastolic dysfunction.  There was mild aortic insufficiency and trivial TR and PR.  Pulmonary pressures were  normal.  From a sleep perspective, he continues to use his BiPAP therapy.  He admits to his mouth getting dry during the day.  He has a nasal mask.  He may be having oral venting.  In the past.  He was given a chin strap but has not been consistently using this.  He has not had any recent download.  He believes his sleep initiation is good but he is having difficulty with sleep maintenance. He uses [employer] for his DME company.    He had experienced swelling in his lower extremity and a lower extremity Doppler study was negative for DVT in [place].  He was able to walk a mile without significant abnormalities, but when he has to carry his garbage can to the curb he notes shortness of breath.    Since his office visit in April 2018, he has continued to be without chest pain.  There is no change in his previous shortness of breath.  I had attempted to initiate spironolactone with his diastolic dysfunction but apparently during my absence heat, cold, the office and was concerned that he had not noticed any significant change.  He was advised to go back and take one half of his previous dose of torsemide and one half of spironolactone.  He has been doing this and actually he has felt somewhat better.  He continues to take lisinopril 40 mg, Bystolic 20 mg, and amlodipine for hypertension.  He denies significant swelling.  He is on Crestor for hyperlipidemia.   When I saw him in October 2018, he had received a new BiPAP machine, ResMed air curve 10 feet auto.  His set up date was 07/06/2017.  His minimum EPAP pressure is 9 with a maximum IPAP pressure of 25.  In the office today.  I obtained a download from 08/12/2017 through 09/10/2017.  He was 100% compliant with usage stays and usage greater than 4 hours was 97%.  He is averaging almost 6 hours and 30 minutes of sleep per night.  AHI is excellent at 1.2 and his 95th percentile pressure was 14.7/10.8 with a maximum average pressure of 15.8/11.8.  He has  noticed significant improvement with his new machine.  He is sleeping well.  When I saw him in January 2019 he was experiencing rare palpitations which he described as a chest flutter, which may last up to 30 seconds.  He has noticed this rarely every 2-3 months and typically there is an abrupt onset and abrupt cessation. Marland Kitchen  He denies presyncope or syncope.  He denies associated chest tightness.  At that office visit, I discontinued amlodipine and change this to Cardizem CD 120 mg at night and also gave a prescription from a told metoprolol tartrate to take 12.5 to 25 mg as needed.  I also discussed improved sleep duration at all possible.  At his evaluation in June 2019  his major complaint is that of fatigue well as exertional dyspnea.  He denied chest pain, swelling or awareness of palpitations.  He continued to be on Bystolic 20 mg, diltiazem CD 120 daily, lisinopril 40 mg daily, in addition to low-dose spironolactone 12.5 mg and torsemide just 5 mg.  Continues to use BiPAP and a download from Dec 22, 2017 through January 20, 2018 shows 100% compliance.  However, sleep duration was only 5 hours and 45 minutes.  95th percentile pressure was 15/11 with a maximum pressure 16/12.  AHI is excellent at 1.0.    I  evaluated him on December 11, 2018 in a telemedicine visit.  His blood pressure was controlled.  I reviewed a recent download which revealed 100% compliance with BiPAP usage however he was not using therapy for long enough duration and was only averaging 3 hours and 14 minutes.  His AHI was elevated at 20.3 on an EPAP minimum of 9 and IPAP maximum of 25.  I increase his EPAP minimum to 11 and further increased his pressure support to 5.  He was walking several days per week.  His echo study had previously demonstrated grade 1 diastolic dysfunction.  Coronary CT had noticed coronary calcification and for this reason I continued him on rosuvastatin at 0 mg attempt to induce LDL reduction to the 50s or  below.  He had undergone laboratory by his primary provider and LDL cholesterol was excellent at 37.  Primary provider question whether or not his dose of rosuvastatin should be decreased to 20 mg down to 10 mg..  He has been taking rosuvastatin 20 mg daily  When I saw him in October 2019 he continued to experience fatigability.  He was continued to have issues with diarrhea and some abdominal pain and has been seeing Dr. Christoper [name] at [facility].  He was to undergo an abdominal vascular MRI.  He denied any chest tightness or pressure.  He was continuing to use BiPAP typically goes to bed between 10 and 1030 and wakes up around 6:30 AM.  A download in July 2019 showed 100% usage with an AHI of 4.5 he continues to experience some fatigue.  He has been evaluated by Dr. Christoper [name] at [facility] with pain and diarrhea.  He tells me he will be undergoing an abdominal vascular MRI.  He also is status post endoscopy.  He denies chest pain.  At times he notes shortness of breath particularly if he has to [place] b but a subsequent download in September 2019 showed increased AHI at 9.9 and was having some significant mask issues.  Presently, he has been on diltiazem 120 mg daily, lisinopril 40 mg, Bystolic 10 mg, in addition to Spironolactone 12.5 mg.  He also is on rosuvastatin 20 mg for hyperlipidemia.  His primary physician has been checking laboratory.  Remotely his LDL cholesterols have consistently been excellent and less than 60.   He was evaluated by Kenneth [name], Kenneth [name] in February 2020.  Of note, he had undergone an abdominal CT and this had demonstrated mild coronary artery calcification.  He was seen by his primary care provider Dr. Janna [name] early February 2020 and his blood pressure was 106/56.  He was feeling well and was asymptomatic.   I evaluated him in a telemedicine visit on December 11, 2018.  A download was obtained  of his  BiPAP unit from March 30 through December 10, 2018.  Usage was 100%.  However, daily usage declined to  only 3 hours and 14  minutes and only 10 out of 30 days with usage greater than 4 hours.  He apparently has a full facemask.  He never received the air fit F 30 little I that we had recommended but has a new full facemask covering his nose.  On his most recent download he did not have any significant leak.  He is set at an EPAP minimum pressure of 9 with an IPAP maximum pressure of 25 and a pressure support of 4.  AHI, however is elevated at 20.3 with an apnea index of 20.1.  95th percentile pressure was 22.1/18.1.  Typically he has been taking the mask off after he goes to the bathroom sometime during the night and typically has not put it on consistently.  He also has had some issues with dry mouth.    During that evaluation I adjusted his BiPAP therapy and increased EPAP minimum to 11 and increase pressure support to 5 such that his initial pressure will be 16/11 with potential titration up to 25/20.  Gust the importance of improved sleep duration and using CPAP for the preponderance of the night.  I saw him on August 07, 2019 and since his prior evaluation he had seen Dr. Janna [name], his primary physician who I checked laboratory. Laboratory  showed an LDL cholesterol at 37 and his primary physician was wondering if his rosuvastatin dose should be reduced 20 down to 10 mg.  Prior CT did demonstrate coronary calcification as well as suggested a possible small pericardial effusion.  He has continued to use BiPAP and a new download from July 08, 2019 through August 06, 2019 shows 100% usage and usage greater than 4 hours at 73%.  Average usage on days used was 4 hours and 43 minutes.  He has issues with dry mouth.  AHI was improved from previously but still elevated at 10.3.  His 95th percentile pressure was 22.4/17.4 with a maximum average pressure 23.4/18.5.  He continues to see Dr. Amalia [name] for his urologic issues.  There is strong family history of prostate CA and the patient requires self cathing on a daily  basis.  He denies chest pain.  Is admit to a vague chest sensation if he bends over.  He denies any chest pain or shortness of breath with cutting his grass with a walking lawnmower.  He underwent a follow-up echo Doppler study on August 26, 2019 which showed an EF of 50 to 55% and grade 1 diastolic dysfunction.  There was mild to moderate aortic valve sclerosis without stenosis and mild aortic insufficiency.  There is mild dilation of his aortic root measuring 40 mm.    I saw him in July 2021 and over the prior 7 months he continued to do well. He  had issues with chronic pain and will need to undergo epidural injection scheduled tentatively for March 23, 2020.  He was to undergo repeat TURP surgery with Dr. Amalia [name] on August 16.  He has noticed some intermittent ankle swelling despite taking spironolactone 12.5 mg daily.  He has continued to use BiPAP therapy.  He does have some issues with sleep maintenance.  Download was obtained in the office today from June 20 through March 01, 2020 which shows 100% compliance.  However he usage duration is only 4 hours and 29 minutes.  AHI is elevated at 11.4 with an apnea index of 11.1 and central index of 4.4.  His 95th percentile pressure is 23.4/17.4 with a maximum average pressure 24/18 cm of water.  I saw him on November 05, 2020 and since his prior evaluation he denied any episodes of chest pain or shortness of breath.  His blood pressure has been mildly elevated and he has been on lisinopril 40 mg daily, bisoprolol 10 mg, diltiazem 120 mg, and spironolactone 12.5 mg daily.  He has a prescription for metoprolol tartrate to take 12.5 or 25 mg as needed for palpitations.  He is scheduled to undergo a cystoscopy with Dr. Amalia [name] in 2 weeks.  He has been undergoing daily straight caths.  He has continued to use CPAP therapy and a download was obtained from February 23 through November 04, 2020.  He is compliant with usage days and average usage is 4 hours and 53 minutes.   Current settings include a minimum EPAP of 16 with maximum IPAP of 25 and pressure support of 6.  He continues to have complex events with an AHI of 14 and has both obstructive and central apnea.  He denies shortness of breath.  For that evaluation, I recommended slight increase in his pressure support up to 7 cm of water to be beneficial in improving his obstructive and central events.  At his last evaluation with me on April 21, 2021 he continued to be stable from a cardiac standpoint and denied any chest pain or shortness of breath.  .  His blood pressure has been controlled on bisoprolol 10 mg, diltiazem 120 mg, lisinopril 40 mg and has been taking metoprolol tartrate on a as needed basis for palpitations.  Over the last 6 months he only took metoprolol on 2 occasions.  At times he admits to some edema left ankle greater than right.  He continues to be followed by Dr. Amalia [name].  Edema has improved with increase in spironolactone dose to 25 mg daily.  Gone recent laboratory and creatinine was minimally increased at 1.32 with GFR in the upper 50s consistent with stage IIIa CKD.  From a sleep perspective, he continued to use his BiPAP with excellent compliance.  I obtained a download from August 9 through April 20, 2021.  Currently he is set at a minimum EPAP of 16, maximum IPAP of 25, and pressure support of 7.  His 95th percentile maximum pressures are ranging around 24/17.  AHI continues to be increased at 14.3 with 6.9 central apneic events and 7.2 obstructive apneic events.  He has had issues with dry mouth.  With his normal LV function and continued complex sleep apnea with central events I felt he would benefit from ASV titration.  He underwent an ASV titration on May 11, 2021.  During that evaluation he again was noted to have severe obstructive sleep apnea with an AHI of 37.5 and RDI of 42.2 with oxygen desaturation to a nadir of 84%.  He had moderate snoring and O2 nadir 84%.  An initial  trial of SV advanced ASV therapy with an EPAP min of 7, maximum 14, pressure support min of 4 with maximum of 11 and maximum IPAP pressure of 25 with breath rate auto breaths per minute was recommended.  Since I last saw him, he believes he is sleeping better with the ASV unit.  I obtained a download in the office today from November 15 through December 14 which still showed an increased AHI at 19.4.  He is meeting compliance standards but average usage continues to be suboptimal at only 5 hours per night.  Typically he goes to bed between 10 and 11 PM and wakes up at  6 AM.  There is no significant mask leak.  His 95th percentile pressure is 22.6/13.6 with a maximum average pressure 24.2/13.9.  He believes he is sleeping better with ASV therapy.  An Epworth Sleepiness Scale score was recalculated in the office today and this endorsed at 6 arguing against residual daytime sleepiness.  He presents for evaluation.  Past Medical History:  Diagnosis Date   Amaurosis fugax 01/13/354   Diastolic dysfunction    grade 1 by echo 4/11   Dyslipidemia    HTN (hypertension)    Sleep apnea    on BiPap    Past Surgical History:  Procedure Laterality Date   CATARACT EXTRACTION Bilateral    CHOLECYSTECTOMY     SEPTOPLASTY  2000   TONSILLECTOMY     TRANSURETHRAL RESECTION OF PROSTATE  1997    No Known Allergies  Current Outpatient Medications  Medication Sig Dispense Refill   allopurinol (ZYLOPRIM) 100 MG tablet Take 200 mg by mouth daily.      bisoprolol (ZEBETA) 10 MG tablet Take 1 tablet (10 mg total) by mouth daily. 90 tablet 3   Cholecalciferol 25 MCG (1000 UT) tablet Take 1,000 Units by mouth daily.     clopidogrel (PLAVIX) 75 MG tablet TAKE 1 TABLET BY MOUTH  DAILY 90 tablet 3   diltiazem (CARDIZEM CD) 120 MG 24 hr capsule TAKE 1 CAPSULE(120 MG) BY MOUTH DAILY 90 capsule 3   ferrous sulfate 325 (65 FE) MG EC tablet Take 325 mg by mouth daily.     fish oil-omega-3 fatty acids 1000 MG capsule Take  1,200 mg by mouth daily.      lisinopril (ZESTRIL) 40 MG tablet TAKE 1 TABLET BY MOUTH DAILY 90 tablet 3   loratadine (CLARITIN) 10 MG tablet Take 10 mg by mouth daily.     Melatonin 3 MG TABS Take 1 tablet by mouth at bedtime as needed.     metoprolol tartrate (LOPRESSOR) 25 MG tablet TAKE 1/2 TO 1 TABLET BY MOUTH AS NEEDED FOR PALPITATIONS 30 tablet 9   rosuvastatin (CRESTOR) 20 MG tablet Take 1 tablet (20 mg total) by mouth daily. Schedule an appointment with cardiology for further refills, 1st attempt 90 tablet 0   spironolactone (ALDACTONE) 25 MG tablet Take 1 tablet (25 mg total) by mouth daily. Take 25 mg (1 tablet) 90 tablet 3   tamsulosin (FLOMAX) 0.4 MG CAPS Take 0.4 mg by mouth daily after supper.     testosterone cypionate (DEPOTESTOSTERONE CYPIONATE) 200 MG/ML injection Inject 400 mg into the muscle every 30 (thirty) days.     Zinc 30 MG TABS Take by mouth.     No current facility-administered medications for this visit.    Social History   Socioeconomic History   Marital status: Married    Spouse name: Kenneth [name]   Number of children: 3   Years of education: MD   Highest education level: Not on file  Occupational History   Occupation: Retired  Tobacco Use   Smoking status: Former   Smokeless tobacco: Never  Substance and Sexual Activity   Alcohol use: No   Drug use: No   Sexual activity: Not on file  Other Topics Concern   Not on file  Social History Narrative   Lives at home w/ his wife   Patient is right handed.   Patient drinks 2 cups caffeine daily.   Social Determinants of Health   Financial Resource Strain: Not on file  Food Insecurity: Not on file  Transportation  Needs: Not on file  Physical Activity: Not on file  Stress: Not on file  Social Connections: Not on file  Intimate Partner Violence: Not on file    Family History  Problem Relation Age of Onset   Alzheimer's disease Mother 56   Emphysema Father    Socially he is married and has 3 children,  14 grandchildren and 3 great-grandchildren. There is no tobacco or alcohol use.  ROS General: Negative; No fevers, chills, or night sweats;  HEENT: Positive for 2 transient episodes over the past year with left lateral eye field defect, sinus congestion, difficulty swallowing Pulmonary: Negative; No cough, wheezing, shortness of breath, hemoptysis Cardiovascular: See history of present illness GI: Negative; No nausea, vomiting, diarrhea, or abdominal pain GU: Negative; No dysuria, hematuria, or difficulty voiding Musculoskeletal: Positive for history of gout Hematologic/Oncology: Negative; no easy bruising, bleeding Endocrine: Negative; no heat/cold intolerance; no diabetes Neuro: Positive for h/o amaurosis fugax; resolved since Plavix was instituted Skin: Negative; No rashes or skin lesions Psychiatric: Negative; No behavioral problems, depression Sleep: Positive for sleep apnea.  Now on ASV with improved sleep. Other comprehensive 14 point system review is negative.   PE BP 137/73    Pulse (!) 57    Ht 6' (1.829 m)    Wt 183 lb (83 kg)    SpO2 98%    BMI 24.82 kg/m    Repeat blood pressure by me was 138/76.  Wt Readings from Last 3 Encounters:  09/23/21 183 lb (83 kg)  05/11/21 185 lb (83.9 kg)  04/21/21 186 lb 3.2 oz (84.5 kg)   General: Alert, oriented, no distress.  Skin: normal turgor, no rashes, warm and dry HEENT: Normocephalic, atraumatic. Pupils equal round and reactive to light; sclera anicteric; extraocular muscles intact;  Nose without nasal septal hypertrophy Mouth/Parynx benign; Mallinpatti scale 3 Neck: No JVD, no carotid bruits; normal carotid upstroke Lungs: clear to ausculatation and percussion; no wheezing or rales Chest wall: without tenderness to palpitation Heart: PMI not displaced, RRR, s1 s2 normal, 1/6 systolic murmur, no diastolic murmur, no rubs, gallops, thrills, or heaves Abdomen: soft, nontender; no hepatosplenomehaly, BS+; abdominal aorta  nontender and not dilated by palpation. Back: no CVA tenderness Pulses 2+ Musculoskeletal: full range of motion, normal strength, no joint deformities Extremities: no clubbing cyanosis or edema, Homan's sign negative  Neurologic: grossly nonfocal; Cranial nerves grossly wnl Psychologic: Normal mood and affect  September 23, 2021 ECG (independently read by me): Sinus bradycardia 55, first-degree AV block with PR interval 210 ms.   April 21, 2021 ECG (independently read by me): Sinus bradycardia 58 bpm with first-degree AV block..  PR 224 ms  November 05, 2020 ECG (independently read by me): Sinus bradycardia at 59, 1st degree block; PR 208 msec  July 2021 ECG (independently read by me): Sinus bradycardia 52 bpm.  No ectopy.  PR interval 206 ms  August 07, 2019 ECG (independently read by me): Sinus rhythm at 63 bpm with first-degree AV block with a PR 218 ms.  No ectopy.  October 2019 ECG (independently read by me): Sinus bradycardia with first-degree AV block.  PR interval 214 ms.  No ST segment changes.  No ectopy.  June 2019 ECG (independently read by me): Sinus bradycardia at 50 bpm.  First-degree AV block.  No significant ST segment changes.  January 2019 ECG (independently read by me): Sinus bradycardia 59 bpm.  Borderline first degree AV block with a PR interval 202 ms; QTC normal at 394 ms.  No ectopy.  April 2018 ECG (independently read by me): Normal sinus rhythm at 60, normal intervals.  No ST segment changes  October 2017 ECG (independently read by me): Sinus bradycardia at 57 bpm.  PR interval 200 ms.  QTc interval 395 ms.  July 2017 ECG (independently read by me): Normal sinus rhythm at 61 bpm.  Normal QTc with borderline first-degree AV block  November 2016 ECG (independently read by me): Normal sinus rhythm at 60 bpm.  PR interval 200 ms.  June 2016 ECG (independently read by me): Sinus rhythm at 63.  Intervals normal.  No ST segment changes  December 2015 ECG  (independently read by me): Sinus bradycardia 59 bpm.  Mild first-degree AV block with a PR interval 212 ms.  QTc interval normal.  No ST segment changes.  June 2015 ECG (independently read by me): sinus rhythm at 59 beats per minute.  Borderline first degree AV block with a PR interval of 204 ms.  ECG: Sinus rhythm at 61 beats per minute. Borderline first degree AV block with PR interval 200 ms. QT interval normal 388 ms.  LABS: BMP Latest Ref Rng & Units 09/26/2021 04/16/2020 12/18/2016  Glucose 70 - 99 mg/dL 96 96 95  BUN 8 - 27 mg/dL '19 18 12  ' Creatinine 0.76 - 1.27 mg/dL 1.29(H) 1.24 1.05  BUN/Creat Ratio 10 - '24 15 15 ' -  Sodium 134 - 144 mmol/L 141 133(L) 142  Potassium 3.5 - 5.2 mmol/L 4.9 4.6 4.1  Chloride 96 - 106 mmol/L 106 100 110  CO2 20 - 29 mmol/L '23 23 25  ' Calcium 8.6 - 10.2 mg/dL 9.2 8.9 8.5(L)   Hepatic Function Latest Ref Rng & Units 09/26/2021 04/16/2020 12/18/2016  Total Protein 6.0 - 8.5 g/dL 6.5 6.0 6.1  Albumin 3.6 - 4.6 g/dL 3.8 3.7 3.6  AST 0 - 40 IU/L '16 17 15  ' ALT 0 - 44 IU/L '15 16 14  ' Alk Phosphatase 44 - 121 IU/L 62 58 46  Total Bilirubin 0.0 - 1.2 mg/dL 0.6 0.8 0.8   CBC Latest Ref Rng & Units 12/18/2016 03/20/2016 07/19/2015  WBC 3.8 - 10.8 K/uL 6.2 6.3 6.0  Hemoglobin 13.2 - 17.1 g/dL 13.0(L) 14.9 14.6  Hematocrit 38.5 - 50.0 % 39.5 44.6 42.3  Platelets 140 - 400 K/uL 149 155 150   Lab Results  Component Value Date   MCV 96.8 12/18/2016   MCV 95.1 03/20/2016   MCV 93.6 07/19/2015   Lab Results  Component Value Date   TSH 1.090 09/26/2021  No results found for: HGBA1C   Lipid Panel      Component Value Date/Time   CHOL 98 (L) 09/26/2021 0827   TRIG 87 09/26/2021 0827   HDL 36 (L) 09/26/2021 0827   CHOLHDL 2.7 09/26/2021 0827   CHOLHDL 3.0 12/18/2016 0803   VLDL 19 12/18/2016 0803   LDLCALC 45 09/26/2021 0827     RADIOLOGY: No results found.  IMPRESSION:  1. 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 Primary hypertension   3. Hyperlipidemia  LDL goal <70   4. Amaurosis fugax   5. Grade I diastolic dysfunction     ASSESSMENT AND PLAN: Mr. Kenneth [name] is an 86 year-old gentleman who has a history of hypertension, hyperlipidemia, intermittent peripheral edema, severe complex obstructive sleep apnea, as well as remote history of gout. He has documented bilateral renal cysts and is status post TURP.He has an increased postvoid residual and straight caths himself one time every  day.  He sees Dr. Amalia [name] and had a repeat TURP surgery in March 29, 2020.  He continues to be on Plavix following his remote stroke.  Remotely he had experienced several episodes of amaurosis fugax in his left eye and an MRI of his brain showed mild small vessel ischemic changes and an MRA of his head and neck and carotid studies were unremarkable.  When I saw him in March 2022 his blood pressure was elevated, and with his previously documented grade 1 diastolic dysfunction I recommended slight additional titration of spironolactone to 25 mg which would be helpful both for blood pressure control and edema.  He has had issues with ankle edema left greater than right.  This improved with increase spironolactone dosing.  Currently, he is on bisoprolol 10 mg daily, diltiazem 120 mg daily, lisinopril 40 mg daily, spironolactone 25 mg daily and has a prescription for metoprolol tartrate to take as needed for palpitations.  His blood pressure today is stable.  He continues to be on allopurinol with his history of gout.  Since I last saw him due to ongoing central events, I recommended he undergo ASV titration.  He has documented normal LV function.  I reviewed his sleep study in detail.  His currently he is meeting compliance standards on his most recent download however I discussed with him the importance of ideal sleep at 7 and 9 hours for an adult.  Presently he is averaging use at only 5 hours per night.  I discussed with him typically sleep apnea is most significant during REM  sleep and the preponderance of the sleep occurs in the second half of the night so would be very important for him to use therapy for the entire nights duration.  His most recent download continues to show an apnea index of 15 with AHI of 19.4.  As result, I will change his ASV settings to an EPAP minimum of 7 with maximum of 15, and change his pressure support to a range of 4 to 15 cm of water.  Clinically he feels he is sleeping much better with ASV compared to his previous BiPAP.  A new Epworth scale was calculated in the office today and this endorsed at 6 arguing against residual daytime sleepiness.  We will obtain a new download in a month.  He is followed by Kenneth [name] for primary care and is to see nephrology.  I have recommended he undergo a fasting comprehensive metabolic panel, TSH and lipid panel.  I will see him in 6 months for follow-up evaluation.    Kenneth Sine, MD, [facility]  10/02/2021 10:53 AM

## 2021-09-23 NOTE — Patient Instructions (Signed)
Medication Instructions:  Your physician recommends that you continue on your current medications as directed. Please refer to the Current Medication list given to you today.  *If you need a refill on your cardiac medications before your next appointment, please call your pharmacy*   Lab Work: Your physician recommends that you return for lab work in: next week or so for FASTING CMET, Lipids, and TSH   If you have labs (blood work) drawn today and your tests are completely normal, you will receive your results only by: [place] (if you have MyChart) OR A paper copy in the mail If you have any lab test that is abnormal or we need to change your treatment, we will call you to review the results.   Follow-Up: At [facility], you and your health needs are our priority.  As part of our continuing mission to provide you with exceptional heart care, we have created designated Provider Care Teams.  These Care Teams include your primary Cardiologist (physician) and Advanced Practice Providers (APPs -  Physician Assistants and Nurse Practitioners) who all work together to provide you with the care you need, when you need it.  We recommend signing up for the patient portal called "MyChart".  Sign up information is provided on this After Visit Summary.  MyChart is used to connect with patients for Virtual Visits (Telemedicine).  Patients are able to view lab/test results, encounter notes, upcoming appointments, etc.  Non-urgent messages can be sent to your provider as well.   To learn more about what you can do with MyChart, go to [email or web address].    Your next appointment:   6 month(s)  The format for your next appointment:   In Person  Provider:   Shelva Majestic, MD

## 2021-09-24 ENCOUNTER — Other Ambulatory Visit: Payer: Self-pay | Admitting: Cardiovascular Disease

## 2021-09-26 LAB — COMPREHENSIVE METABOLIC PANEL
ALT: 15 IU/L (ref 0–44)
AST: 16 IU/L (ref 0–40)
Albumin/Globulin Ratio: 1.4 (ref 1.2–2.2)
Albumin: 3.8 g/dL (ref 3.6–4.6)
Alkaline Phosphatase: 62 IU/L (ref 44–121)
BUN/Creatinine Ratio: 15 (ref 10–24)
BUN: 19 mg/dL (ref 8–27)
Bilirubin Total: 0.6 mg/dL (ref 0.0–1.2)
CO2: 23 mmol/L (ref 20–29)
Calcium: 9.2 mg/dL (ref 8.6–10.2)
Chloride: 106 mmol/L (ref 96–106)
Creatinine, Ser: 1.29 mg/dL — ABNORMAL HIGH (ref 0.76–1.27)
Globulin, Total: 2.7 g/dL (ref 1.5–4.5)
Glucose: 96 mg/dL (ref 70–99)
Potassium: 4.9 mmol/L (ref 3.5–5.2)
Sodium: 141 mmol/L (ref 134–144)
Total Protein: 6.5 g/dL (ref 6.0–8.5)
eGFR: 55 mL/min/{1.73_m2} — ABNORMAL LOW (ref >59)

## 2021-09-26 LAB — LIPID PANEL
Chol/HDL Ratio: 2.7 ratio (ref 0.0–5.0)
Cholesterol, Total: 98 mg/dL — ABNORMAL LOW (ref 100–199)
HDL: 36 mg/dL — ABNORMAL LOW (ref >39)
LDL Chol Calc (NIH): 45 mg/dL (ref 0–99)
Triglycerides: 87 mg/dL (ref 0–149)
VLDL Cholesterol Cal: 17 mg/dL (ref 5–40)

## 2021-09-26 LAB — TSH: TSH: 1.09 u[IU]/mL (ref 0.450–4.500)

## 2021-10-02 ENCOUNTER — Encounter: Payer: Self-pay | Admitting: Cardiovascular Disease

## 2021-10-17 ENCOUNTER — Other Ambulatory Visit: Payer: Self-pay | Admitting: Cardiovascular Disease

## 2021-10-27 ENCOUNTER — Other Ambulatory Visit: Payer: Self-pay | Admitting: Cardiovascular Disease

## 2021-12-14 ENCOUNTER — Other Ambulatory Visit: Payer: Self-pay | Admitting: Cardiovascular Disease

## 2022-01-28 ENCOUNTER — Other Ambulatory Visit: Payer: Self-pay | Admitting: Cardiovascular Disease

## 2022-03-06 ENCOUNTER — Encounter: Payer: Self-pay | Admitting: Cardiovascular Disease

## 2022-04-07 ENCOUNTER — Encounter: Payer: Self-pay | Admitting: Cardiovascular Disease

## 2022-04-07 ENCOUNTER — Ambulatory Visit (INDEPENDENT_AMBULATORY_CARE_PROVIDER_SITE_OTHER): Payer: Medicare Other | Admitting: Cardiovascular Disease

## 2022-04-07 DIAGNOSIS — I10 Essential (primary) hypertension: Secondary | ICD-10-CM | POA: Diagnosis not present

## 2022-04-07 DIAGNOSIS — G4731 Primary central sleep apnea: Secondary | ICD-10-CM

## 2022-04-07 DIAGNOSIS — E785 Hyperlipidemia, unspecified: Secondary | ICD-10-CM | POA: Diagnosis not present

## 2022-04-07 DIAGNOSIS — G453 Amaurosis fugax: Secondary | ICD-10-CM | POA: Diagnosis not present

## 2022-04-07 DIAGNOSIS — G4733 Obstructive sleep apnea (adult) (pediatric): Secondary | ICD-10-CM

## 2022-04-07 DIAGNOSIS — I5189 Other ill-defined heart diseases: Secondary | ICD-10-CM

## 2022-04-07 NOTE — Patient Instructions (Signed)
Medication Instructions:  No changes  *If you need a refill on your cardiac medications before your next appointment, please call your pharmacy*   Lab Work:  not needed   Testing/Procedures:  Not needed  Follow-Up: At [facility], you and your health needs are our priority.  As part of our continuing mission to provide you with exceptional heart care, we have created designated Provider Care Teams.  These Care Teams include your primary Cardiologist (physician) and Advanced Practice Providers (APPs -  Physician Assistants and Nurse Practitioners) who all work together to provide you with the care you need, when you need it.     Your next appointment:   12 month(s)  The format for your next appointment:   In Person  Provider:   Nicki Gu[place], MD    Other Instructions  No  pressure changes were done to your C-PAP

## 2022-04-07 NOTE — Progress Notes (Unsigned)
[name] ID: Kenneth [name], male   DOB: 05-Jul-1936, 86 y.o.   MRN: [id number]     PCP: Dr. Hampton [name] at [facility] in [place]  HPI: Kenneth [name] is a 86 y.o. male who presents for a 6 month f/u sleep/cardiology evaluation.  Kenneth [name] has a history of hypertension, hyperlipidemia, gout, and is status post TURP surgery by Dr. Amalia [name].  He has a residual underactive bladder and as result straight caths himself one time every day.  He states his post void residuals are in Kenneth 250-300 cc range.  He also has  bilateral renal cysts. He has a history of documented moderate obstructive sleep apnea with an AHI of 22.7 and an RDI of 33 and has been on BiPAP Auto therapy with an EPAP minimum of 10, IPAP maximum potential up to 25 with a delta of for which he started in July 2012. 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  Recently, he has had issues with very dry mouth.  He uses a nasal mask.  He has had intermittent use of a chin strap.    An echo Doppler study in 05/2013 revealed normal left ventricle wall thickness with normal systolic and diastolic function. He did have mild aortic regurgitation due to poor central leaflet coaptation. There was borderline prolapse of his anterior mitral valve leaflet with trivial mitral regurgitation. He had normal pulmonary pressures assessment artery systolic pressure 19 mm.  He has had issues in Kenneth past with peripheral edema, which had led to an increased dose of his torsemide last year.  He denies exertionally precipitated chest pain.  He did experience one episode of some chest fullness or night when he was sitting on his bed, but this ultimately resolved, and he has not had any further sensation He is unaware of palpitations.  He denies presyncope or syncope.  In December 2015, he complained of experiencing transient episodes of a field defect in Kenneth lateral left eye.  These were both self-limiting.  He saw an ophthalmologist  who recommended that he notify me concerning Kenneth symptoms  His history is notable in that he did sustain to prior head injuries many years ago.  Once when he was in high school and had a severe concussion.  Another time occurred when he fell out of a tree stand when he was hunting.  I subtotal referred him to [facility] neurology.  Due to my concern for possible amaurosis fugax.  He saw Dr. Floyde [name], who felt that Kenneth [name] had experienced at least 3 episodes of amaurosis fugax in his left eye.  He underwent an MRI of Kenneth brain that showed mild small vessel ischemic changes in Kenneth MRA of Kenneth head and carotid Doppler studies which were unremarkable.  He was taken off aspirin and started on Plavix 75 mg.  He denies any recurrent symptomatology.  His primary physician is Dr. Janna [name] in [place].   He has a history of hyperlipidemia and is tolerating Crestor 20 mg.  He has been on amlodipine 5 mg, Bystolic 20 mg, and lisinopril 40 mg for hypertension.  He was found to have increasing PSA levels with his levels rising from 1.5  to 7 and saw Dr. Amalia [name] for biopsy. He underwent cholecystectomy and at that time his ECG showed a PR interval at 212 ms, which was of concern by Kenneth anesthesiologist.  He tolerated surgery well without cardiovascular compromise.   In 2017 he had noticed a change in development  of shortness of breath with less activity.  He denied associated chest tightness and was experiencing shortness of breath with walking less than 100 yards.  This has been ongoing for several months.  He had noticed some trace swelling in his ankles.  A nuclear stress test which was done on 04/11/2016.  Ejection fraction was 59%.  There were no ECG changes.  There was normal perfusion.  An echo Doppler study on 04/12/2016 showed mild LVH with normal systolic function with very mild grade 1 diastolic dysfunction.  There was mild aortic insufficiency and trivial TR and PR.  Pulmonary pressures were  normal.  From a sleep perspective, he continues to use his BiPAP therapy.  He admits to his mouth getting dry during Kenneth day.  He has a nasal mask.  He may be having oral venting.  In Kenneth past.  He was given a chin strap but has not been consistently using this.  He has not had any recent download.  He believes his sleep initiation is good but he is having difficulty with sleep maintenance. He uses Kenneth [name] for his DME company.    He had experienced swelling in his lower extremity and a lower extremity Doppler study was negative for DVT in [place].  He was able to walk a mile without significant abnormalities, but when he has to carry his garbage can to Kenneth curb he notes shortness of breath.    Since his office visit in April 2018, he has continued to be without chest pain.  There is no change in his previous shortness of breath.  I had attempted to initiate spironolactone with his diastolic dysfunction but apparently during my absence heat, cold, Kenneth office and was concerned that he had not noticed any significant change.  He was advised to go back and take one half of his previous dose of torsemide and one half of spironolactone.  He has been doing this and actually he has felt somewhat better.  He continues to take lisinopril 40 mg, Bystolic 20 mg, and amlodipine for hypertension.  He denies significant swelling.  He is on Crestor for hyperlipidemia.   When I saw him in October 2018, he had received a new BiPAP machine, ResMed air curve 10 feet auto.  His set up date was 07/06/2017.  His minimum EPAP pressure is 9 with a maximum IPAP pressure of 25.  In Kenneth office today.  I obtained a download from 08/12/2017 through 09/10/2017.  He was 100% compliant with usage stays and usage greater than 4 hours was 97%.  He is averaging almost 6 hours and 30 minutes of sleep per night.  AHI is excellent at 1.2 and his 95th percentile pressure was 14.7/10.8 with a maximum average pressure of 15.8/11.8.  He has  noticed significant improvement with his new machine.  He is sleeping well.  When I saw him in January 2019 he was experiencing rare palpitations which he described as a chest flutter, which may last up to 30 seconds.  He has noticed this rarely every 2-3 months and typically there is an abrupt onset and abrupt cessation. Marland Kitchen  He denies presyncope or syncope.  He denies associated chest tightness.  At that office visit, I discontinued amlodipine and change this to Cardizem CD 120 mg at night and also gave a prescription from a told metoprolol tartrate to take 12.5 to 25 mg as needed.  I also discussed improved sleep duration at all possible.  At his evaluation in June 2019  his major complaint is that of fatigue well as exertional dyspnea.  He denied chest pain, swelling or awareness of palpitations.  He continued to be on Bystolic 20 mg, diltiazem CD 120 daily, lisinopril 40 mg daily, in addition to low-dose spironolactone 12.5 mg and torsemide just 5 mg.  Continues to use BiPAP and a download from Dec 22, 2017 through January 20, 2018 shows 100% compliance.  However, sleep duration was only 5 hours and 45 minutes.  95th percentile pressure was 15/11 with a maximum pressure 16/12.  AHI is excellent at 1.0.    I evaluated him on December 11, 2018 in a telemedicine visit.  His blood pressure was controlled.  I reviewed a recent download which revealed 100% compliance with BiPAP usage however he was not using therapy for long enough duration and was only averaging 3 hours and 14 minutes.  His AHI was elevated at 20.3 on an EPAP minimum of 9 and IPAP maximum of 25.  I increase his EPAP minimum to 11 and further increased his pressure support to 5.  He was walking several days per week.  His echo study had previously demonstrated grade 1 diastolic dysfunction.  Coronary CT had noticed coronary calcification and for this reason I continued him on rosuvastatin at 0 mg attempt to induce LDL reduction to Kenneth 50s or below.  He  had undergone laboratory by his primary provider and LDL cholesterol was excellent at 37.  Primary provider question whether or not his dose of rosuvastatin should be decreased to 20 mg down to 10 mg..  He has been taking rosuvastatin 20 mg daily  When I saw him in October 2019 he continued to experience fatigability.  He was continued to have issues with diarrhea and some abdominal pain and has been seeing Dr. Christoper Fabian at [facility].  He was to undergo an abdominal vascular MRI.  He denied any chest tightness or pressure.  He was continuing to use BiPAP typically goes to bed between 10 and 1030 and wakes up around 6:30 AM.  A download in July 2019 showed 100% usage with an AHI of 4.5 he continues to experience some fatigue.  He has been evaluated by Dr. Christoper Fabian at [facility] with pain and diarrhea.  He tells me he will be undergoing an abdominal vascular MRI.  He also is status post endoscopy.  He denies chest pain.  At times he notes shortness of breath particularly if he has to [place] b but a subsequent download in September 2019 showed increased AHI at 9.9 and was having some significant mask issues.  Presently, he has been on diltiazem 120 mg daily, lisinopril 40 mg, Bystolic 10 mg, in addition to Spironolactone 12.5 mg.  He also is on rosuvastatin 20 mg for hyperlipidemia.  His primary physician has been checking laboratory.  Remotely his LDL cholesterols have consistently been excellent and less than 60.   He was evaluated by Almyra Deforest, PAC in February 2020.  Of note, he had undergone an abdominal CT and this had demonstrated mild coronary artery calcification.  He was seen by his primary care provider Dr. Janna [name] early February 2020 and his blood pressure was 106/56.  He was feeling well and was asymptomatic.   I evaluated him in a telemedicine visit on December 11, 2018.  A download was obtained  of his  BiPAP unit from March 30 through December 10, 2018.  Usage was 100%.  However, daily usage declined to only 3 hours  and 14 minutes  and only 10 out of 30 days with usage greater than 4 hours.  He apparently has a full facemask.  He never received Kenneth air fit F 30 little I that we had recommended but has a new full facemask covering his nose.  On his most recent download he did not have any significant leak.  He is set at an EPAP minimum pressure of 9 with an IPAP maximum pressure of 25 and a pressure support of 4.  AHI, however is elevated at 20.3 with an apnea index of 20.1.  95th percentile pressure was 22.1/18.1.  Typically he has been taking Kenneth mask off after he goes to Kenneth bathroom sometime during Kenneth night and typically has not put it on consistently.  He also has had some issues with dry mouth.    During that evaluation I adjusted his BiPAP therapy and increased EPAP minimum to 11 and increase pressure support to 5 such that his initial pressure will be 16/11 with potential titration up to 25/20.  Gust Kenneth importance of improved sleep duration and using CPAP for Kenneth preponderance of Kenneth night.  I saw him on August 07, 2019 and since his prior evaluation he had seen Dr. Janna [name], his primary physician who I checked laboratory. Laboratory  showed an LDL cholesterol at 37 and his primary physician was wondering if his rosuvastatin dose should be reduced 20 down to 10 mg.  Prior CT did demonstrate coronary calcification as well as suggested a possible small pericardial effusion.  He has continued to use BiPAP and a new download from July 08, 2019 through August 06, 2019 shows 100% usage and usage greater than 4 hours at 73%.  Average usage on days used was 4 hours and 43 minutes.  He has issues with dry mouth.  AHI was improved from previously but still elevated at 10.3.  His 95th percentile pressure was 22.4/17.4 with a maximum average pressure 23.4/18.5.  He continues to see Dr. Amalia [name] for his urologic issues.  There is strong family history of prostate CA and Kenneth [name] requires self cathing on a daily basis.  He  denies chest pain.  Is admit to a vague chest sensation if he bends over.  He denies any chest pain or shortness of breath with cutting his grass with a walking lawnmower.  He underwent a follow-up echo Doppler study on August 26, 2019 which showed an EF of 50 to 55% and grade 1 diastolic dysfunction.  There was mild to moderate aortic valve sclerosis without stenosis and mild aortic insufficiency.  There is mild dilation of his aortic root measuring 40 mm.    I saw him in July 2021 and over Kenneth prior 7 months he continued to do well. He  had issues with chronic pain and will need to undergo epidural injection scheduled tentatively for March 23, 2020.  He was to undergo repeat TURP surgery with Dr. Amalia [name] on August 16.  He has noticed some intermittent ankle swelling despite taking spironolactone 12.5 mg daily.  He has continued to use BiPAP therapy.  He does have some issues with sleep maintenance.  Download was obtained in Kenneth office today from June 20 through March 01, 2020 which shows 100% compliance.  However he usage duration is only 4 hours and 29 minutes.  AHI is elevated at 11.4 with an apnea index of 11.1 and central index of 4.4.  His 95th percentile pressure is 23.4/17.4 with a maximum average pressure 24/18 cm of water.  I  saw him on November 05, 2020 and since his prior evaluation he denied any episodes of chest pain or shortness of breath.  His blood pressure has been mildly elevated and he has been on lisinopril 40 mg daily, bisoprolol 10 mg, diltiazem 120 mg, and spironolactone 12.5 mg daily.  He has a prescription for metoprolol tartrate to take 12.5 or 25 mg as needed for palpitations.  He is scheduled to undergo a cystoscopy with Dr. Amalia [name] in 2 weeks.  He has been undergoing daily straight caths.  He has continued to use CPAP therapy and a download was obtained from February 23 through November 04, 2020.  He is compliant with usage days and average usage is 4 hours and 53 minutes.  Current  settings include a minimum EPAP of 16 with maximum IPAP of 25 and pressure support of 6.  He continues to have complex events with an AHI of 14 and has both obstructive and central apnea.  He denies shortness of breath.  For that evaluation, I recommended slight increase in his pressure support up to 7 cm of water to be beneficial in improving his obstructive and central events.  At his evaluation with me on April 21, 2021 he continued to be stable from a cardiac standpoint and denied any chest pain or shortness of breath.  .  His blood pressure has been controlled on bisoprolol 10 mg, diltiazem 120 mg, lisinopril 40 mg and has been taking metoprolol tartrate on a as needed basis for palpitations.  Over Kenneth last 6 months he only took metoprolol on 2 occasions.  At times he admits to some edema left ankle greater than right.  He continues to be followed by Dr. Amalia [name].  Edema has improved with increase in spironolactone dose to 25 mg daily.  Gone recent laboratory and creatinine was minimally increased at 1.32 with GFR in Kenneth upper 50s consistent with stage IIIa CKD.  From a sleep perspective, he continued to use his BiPAP with excellent compliance.  I obtained a download from August 9 through April 20, 2021.  Currently he is set at a minimum EPAP of 16, maximum IPAP of 25, and pressure support of 7.  His 95th percentile maximum pressures are ranging around 24/17.  AHI continues to be increased at 14.3 with 6.9 central apneic events and 7.2 obstructive apneic events.  He has had issues with dry mouth.  With his normal LV function and continued complex sleep apnea with central events I felt he would benefit from ASV titration.  He underwent an ASV titration on May 11, 2021.  During that evaluation he again was noted to have severe obstructive sleep apnea with an AHI of 37.5 and RDI of 42.2 with oxygen desaturation to a nadir of 84%.  He had moderate snoring and O2 nadir 84%.  An initial trial of SV  advanced ASV therapy with an EPAP min of 7, maximum 14, pressure support min of 4 with maximum of 11 and maximum IPAP pressure of 25 with breath rate auto breaths per minute was recommended.  I last evaluated him on September 23, 2021.  At that time he felt that he was sleeping significantly better with Kenneth ASV unit.  I obtained a download in Kenneth office today from November 15 through July 27, 2021 which still showed an increased AHI at 19.4.  He is meeting compliance standards but average usage continues to be suboptimal at only 5 hours per night.  Typically he goes to bed between  10 and 11 PM and wakes up at 6 AM.  There is no significant mask leak.  His 95th percentile pressure is 22.6/13.6 with a maximum average pressure 24.2/13.9.  He believes he is sleeping better with ASV therapy.  An Epworth Sleepiness Scale score was recalculated in Kenneth office today and this endorsed at 6 arguing against residual daytime sleepiness.  During that evaluation, I change his ASV settings to EPAP minimum of 7 with maximum of 15, and change his pressure support to a range of 4 to 15 cm of water.  Since I last saw him, he has continued to do well.  His blood pressure has been stable and at home has been ranging in Kenneth 120s over 60s.  He recently had an abdominal x-ray from his primary physician which noted heavy splenic artery calcification.  He continues to straight cath himself 1 time per day.  He has continued to use his ASV device.  A download was obtained from July 16 through March 27, 2022 which confirms 100% use.  He typically goes to bed between 10 and 11 PM and aims to wake up sometime around 7.  Average use was just under 6 hours at 5 hours and 53 minutes.  His 95th percentile pressure is 16.3/9.6 with maximum average pressure 19.7/10.5.  His pressure support range has mainly been around 9.  He believes he is sleeping well.  AHI is excellent at 1.0.  He presents for reevaluation.  Past Medical History:   Diagnosis Date   Amaurosis fugax 10/12/5398   Diastolic dysfunction    grade 1 by echo 4/11   Dyslipidemia    HTN (hypertension)    Sleep apnea    on BiPap    Past Surgical History:  Procedure Laterality Date   CATARACT EXTRACTION Bilateral    CHOLECYSTECTOMY     SEPTOPLASTY  2000   TONSILLECTOMY     TRANSURETHRAL RESECTION OF PROSTATE  1997    No Known Allergies  Current Outpatient Medications  Medication Sig Dispense Refill   allopurinol (ZYLOPRIM) 100 MG tablet Take 200 mg by mouth daily.      bisoprolol (ZEBETA) 10 MG tablet TAKE 1 TABLET BY MOUTH  DAILY 90 tablet 3   Cholecalciferol 25 MCG (1000 UT) tablet Take 1,000 Units by mouth daily.     clopidogrel (PLAVIX) 75 MG tablet TAKE 1 TABLET BY MOUTH  DAILY 90 tablet 2   diltiazem (CARDIZEM CD) 120 MG 24 hr capsule TAKE 1 CAPSULE(120 MG) BY MOUTH DAILY 90 capsule 3   ferrous sulfate 325 (65 FE) MG EC tablet Take 325 mg by mouth daily.     fish oil-omega-3 fatty acids 1000 MG capsule Take 1,200 mg by mouth daily.      lisinopril (ZESTRIL) 40 MG tablet TAKE 1 TABLET BY MOUTH DAILY 90 tablet 3   loratadine (CLARITIN) 10 MG tablet Take 10 mg by mouth daily.     Melatonin 3 MG TABS Take 1 tablet by mouth at bedtime as needed.     metoprolol tartrate (LOPRESSOR) 25 MG tablet TAKE 1/2 TO 1 TABLET BY MOUTH AS NEEDED FOR PALPITATIONS 30 tablet 9   rosuvastatin (CRESTOR) 20 MG tablet TAKE 1 TABLET BY MOUTH DAILY 90 tablet 3   spironolactone (ALDACTONE) 25 MG tablet TAKE 1 TABLET BY MOUTH  DAILY 90 tablet 3   tamsulosin (FLOMAX) 0.4 MG CAPS Take 0.4 mg by mouth daily after supper.     testosterone cypionate (DEPOTESTOSTERONE CYPIONATE) 200 MG/ML injection Inject  400 mg into Kenneth muscle every 30 (thirty) days.     Zinc 30 MG TABS Take by mouth.     No current facility-administered medications for this visit.    Social History   Socioeconomic History   Marital status: Married    Spouse name: Collie Siad   Number of children: 3   Years  of education: MD   Highest education level: Not on file  Occupational History   Occupation: Retired  Tobacco Use   Smoking status: Former   Smokeless tobacco: Never  Substance and Sexual Activity   Alcohol use: No   Drug use: No   Sexual activity: Not on file  Other Topics Concern   Not on file  Social History Narrative   Lives at home w/ his wife   [name] is right handed.   [name] drinks 2 cups caffeine daily.   Social Determinants of Health   Financial Resource Strain: Not on file  Food Insecurity: Not on file  Transportation Needs: Not on file  Physical Activity: Not on file  Stress: Not on file  Social Connections: Not on file  Intimate Partner Violence: Not on file    Family History  Problem Relation Age of Onset   Alzheimer's disease Mother 48   Emphysema Father    Socially he is married and has 3 children, 14 grandchildren and 3 great-grandchildren. There is no tobacco or alcohol use.  ROS General: Negative; No fevers, chills, or night sweats;  HEENT: Positive for 2 transient episodes over Kenneth past year with left lateral eye field defect, sinus congestion, difficulty swallowing Pulmonary: Negative; No cough, wheezing, shortness of breath, hemoptysis Cardiovascular: See history of present illness GI: Negative; No nausea, vomiting, diarrhea, or abdominal pain GU: Negative; No dysuria, hematuria, or difficulty voiding Musculoskeletal: Positive for history of gout Hematologic/Oncology: Negative; no easy bruising, bleeding Endocrine: Negative; no heat/cold intolerance; no diabetes Neuro: Positive for h/o amaurosis fugax; resolved since Plavix was instituted Skin: Negative; No rashes or skin lesions Psychiatric: Negative; No behavioral problems, depression Sleep: Positive for sleep apnea.  Now on ASV with improved sleep. Other comprehensive 14 point system review is negative.   PE BP 138/62   Pulse (!) 58   Ht 6' (1.829 m)   Wt 185 lb (83.9 kg)   SpO2 94%    BMI 25.09 kg/m    Repeat blood pressure by me was 132/64.  Wt Readings from Last 3 Encounters:  04/07/22 185 lb (83.9 kg)  09/23/21 183 lb (83 kg)  05/11/21 185 lb (83.9 kg)   General: Alert, oriented, no distress.  Skin: normal turgor, no rashes, warm and dry HEENT: Normocephalic, atraumatic. Pupils equal round and reactive to light; sclera anicteric; extraocular muscles intact;  Nose without nasal septal hypertrophy Mouth/Parynx benign; Mallinpatti scale 3 Neck: No JVD, no carotid bruits; normal carotid upstroke Lungs: clear to ausculatation and percussion; no wheezing or rales Chest wall: without tenderness to palpitation Heart: PMI not displaced, RRR, s1 s2 normal, 1/6 systolic murmur, no diastolic murmur, no rubs, gallops, thrills, or heaves Abdomen: soft, nontender; no hepatosplenomehaly, BS+; abdominal aorta nontender and not dilated by palpation. Back: no CVA tenderness Pulses 2+ Musculoskeletal: full range of motion, normal strength, no joint deformities Extremities: no clubbing cyanosis or edema, Homan's sign negative  Neurologic: grossly nonfocal; Cranial nerves grossly wnl Psychologic: Normal mood and affect   April 07, 2022 ECG (independently read by me):  Sinus bradycardia at 58; 1st degree AV block , [facility]  September 23, 2021 ECG (independently read by me): Sinus bradycardia 55, first-degree AV block with PR interval 210 ms.   April 21, 2021 ECG (independently read by me): Sinus bradycardia 58 bpm with first-degree AV block..  PR 224 ms  November 05, 2020 ECG (independently read by me): Sinus bradycardia at 59, 1st degree block; PR 208 msec  July 2021 ECG (independently read by me): Sinus bradycardia 52 bpm.  No ectopy.  PR interval 206 ms  August 07, 2019 ECG (independently read by me): Sinus rhythm at 63 bpm with first-degree AV block with a PR 218 ms.  No ectopy.  October 2019 ECG (independently read by me): Sinus bradycardia with first-degree AV block.  PR  interval 214 ms.  No ST segment changes.  No ectopy.  June 2019 ECG (independently read by me): Sinus bradycardia at 50 bpm.  First-degree AV block.  No significant ST segment changes.  January 2019 ECG (independently read by me): Sinus bradycardia 59 bpm.  Borderline first degree AV block with a PR interval 202 ms; QTC normal at 394 ms.  No ectopy.  April 2018 ECG (independently read by me): Normal sinus rhythm at 60, normal intervals.  No ST segment changes  October 2017 ECG (independently read by me): Sinus bradycardia at 57 bpm.  PR interval 200 ms.  QTc interval 395 ms.  July 2017 ECG (independently read by me): Normal sinus rhythm at 61 bpm.  Normal QTc with borderline first-degree AV block  November 2016 ECG (independently read by me): Normal sinus rhythm at 60 bpm.  PR interval 200 ms.  June 2016 ECG (independently read by me): Sinus rhythm at 63.  Intervals normal.  No ST segment changes  December 2015 ECG (independently read by me): Sinus bradycardia 59 bpm.  Mild first-degree AV block with a PR interval 212 ms.  QTc interval normal.  No ST segment changes.  June 2015 ECG (independently read by me): sinus rhythm at 59 beats per minute.  Borderline first degree AV block with a PR interval of 204 ms.  ECG: Sinus rhythm at 61 beats per minute. Borderline first degree AV block with PR interval 200 ms. QT interval normal 388 ms.  LABS:    Latest Ref Rng & Units 09/26/2021    8:27 AM 04/16/2020    8:44 AM 12/18/2016    8:03 AM  BMP  Glucose 70 - 99 mg/dL 96  96  95   BUN 8 - 27 mg/dL $Remove'19  18  12   'vKmqkrc$ Creatinine 0.76 - 1.27 mg/dL 1.29  1.24  1.05   BUN/Creat Ratio 10 - $Re'24 15  15    'xSL$ Sodium 134 - 144 mmol/L 141  133  142   Potassium 3.5 - 5.2 mmol/L 4.9  4.6  4.1   Chloride 96 - 106 mmol/L 106  100  110   CO2 20 - 29 mmol/L $RemoveB'23  23  25   'PLjBqtkV$ Calcium 8.6 - 10.2 mg/dL 9.2  8.9  8.5       Latest Ref Rng & Units 09/26/2021    8:27 AM 04/16/2020    8:44 AM 12/18/2016    8:03 AM  Hepatic Function   Total Protein 6.0 - 8.5 g/dL 6.5  6.0  6.1   Albumin 3.6 - 4.6 g/dL 3.8  3.7  3.6   AST 0 - 40 IU/L $Remov'16  17  15   'mpeuAJ$ ALT 0 - 44 IU/L $Remov'15  16  14   'YOkYae$ Alk Phosphatase 44 - 121 IU/L  62  58  46   Total Bilirubin 0.0 - 1.2 mg/dL 0.6  0.8  0.8       Latest Ref Rng & Units 12/18/2016    8:03 AM 03/20/2016    8:18 AM 07/19/2015    8:04 AM  CBC  WBC 3.8 - 10.8 K/uL 6.2  6.3  6.0   Hemoglobin 13.2 - 17.1 g/dL 13.0  14.9  14.6   Hematocrit 38.5 - 50.0 % 39.5  44.6  42.3   Platelets 140 - 400 K/uL 149  155  150    Lab Results  Component Value Date   MCV 96.8 12/18/2016   MCV 95.1 03/20/2016   MCV 93.6 07/19/2015   Lab Results  Component Value Date   TSH 1.090 09/26/2021  No results found for: "HGBA1C"   Lipid Panel      Component Value Date/Time   CHOL 98 (L) 09/26/2021 0827   TRIG 87 09/26/2021 0827   HDL 36 (L) 09/26/2021 0827   CHOLHDL 2.7 09/26/2021 0827   CHOLHDL 3.0 12/18/2016 0803   VLDL 19 12/18/2016 0803   LDLCALC 45 09/26/2021 0827     RADIOLOGY: No results found.  IMPRESSION:  1. 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 Hyperlipidemia LDL goal <70   3. Primary hypertension   4. Amaurosis fugax   5. Grade I diastolic dysfunction     ASSESSMENT AND PLAN: Kenneth [name] is an 86 year-old gentleman who has a history of hypertension, hyperlipidemia, intermittent peripheral edema, severe complex obstructive sleep apnea, as well as remote history of gout. He has documented bilateral renal cysts and is status post TURP.He has an increased postvoid residual and straight caths himself one time every day.  He sees Dr. Amalia [name] and had a repeat TURP surgery in March 29, 2020.  He continues to be on Plavix following his remote stroke.  Remotely he had experienced several episodes of amaurosis fugax in his left eye and an MRI of his brain showed mild small vessel ischemic changes and an MRA of his head and neck and carotid studies were unremarkable.  When I saw him in March 2022 his  blood pressure was elevated, and with his previously documented grade 1 diastolic dysfunction I recommended slight additional titration of spironolactone to 25 mg which would be helpful both for blood pressure control and edema.  He has had issues with ankle edema left greater than right.  This improved with increase spironolactone dosing.  His blood pressure has remained stable and recordings at home consistently show systolic blood pressure between 120 and 166 and diastolic blood pressures in Kenneth 60s.  He continues to be on bisoprolol 10 mg, diltiazem long-acting 120 mg, lisinopril 40 mg, and spironolactone 25 mg daily.  He takes metoprolol tartrate 12.5 to 25 mg as needed for palpitations.  He is on rosuvastatin 20 mg for hyperlipidemia.  LDL cholesterol on September 26, 2021 was excellent at 45 with total cholesterol 98 HDL 36 and triglycerides 87.  He was recently started on Remeron 7.5 mg by Dr. Erling Cruz at [facility] which has improved his sleep.  He continues to use Kenneth [employer] air curve 10 ASV unit.  Compliance is excellent.  His average pressure support is 9 cm of water and AHI is excellent at 1.0 with his 95th percentile pressure at 16.3/9.6 maximum average pressure 19.7/10.5.  He is sleeping well.  He will continue current therapy.  He continues to see Dr. Amalia [name] for his urologic care.  I will see him in 1 year for reevaluation or sooner as needed  Troy Sine, MD, [facility]  04/08/2022 2:22 PM

## 2022-04-08 ENCOUNTER — Encounter: Payer: Self-pay | Admitting: Cardiovascular Disease

## 2022-04-10 ENCOUNTER — Encounter: Payer: Self-pay | Admitting: Cardiovascular Disease

## 2022-04-28 ENCOUNTER — Other Ambulatory Visit: Payer: Self-pay | Admitting: Cardiovascular Disease

## 2022-05-13 ENCOUNTER — Other Ambulatory Visit: Payer: Self-pay | Admitting: Cardiovascular Disease

## 2022-05-31 ENCOUNTER — Telehealth: Payer: Self-pay | Admitting: *Deleted

## 2022-05-31 NOTE — Telephone Encounter (Signed)
   Pre-operative Risk Assessment    Patient Name: Kenneth [name]  DOB: 15-Aug-1935 MRN: [id number]      Request for Surgical Clearance    Procedure:   SPINAL CORD STIMULATOR TRIAL  Date of Surgery:  Clearance 06/19/22                                 Surgeon:  NOT LISTED Surgeon's Group or Practice Name:  [facility]  Phone number:  [phone] Fax number:  [phone]   Type of Clearance Requested:   - Medical  - Pharmacy:  Hold Clopidogrel (Plavix) x 12 DAYS TOTAL- HOLD FOR 7 DAYS PRIOR, THEN 5 DAYS POST OP OF THE TRIAL PERIOD. THE LEADS WILL BE PULLED ON DAY 5. PLAVIX MAY BE RESTARTED 24 HOURS AFTER THE LEADS ARE PULLED     Type of Anesthesia:  General    Additional requests/questions:    [name]   05/31/2022, 12:45 PM

## 2022-06-06 NOTE — Telephone Encounter (Signed)
1st attempt to reach patient to schedule telephone visit for clearance. No answer. Lvm

## 2022-06-06 NOTE — Telephone Encounter (Signed)
Can we please get him set up for virtual visit Friday, please? If not available Friday could do Monday but would need to be instructed to hold Plavix that morning.   Loel Dubonnet, NP

## 2022-06-07 ENCOUNTER — Telehealth: Payer: Self-pay | Admitting: *Deleted

## 2022-06-07 NOTE — Telephone Encounter (Signed)
Our office has left message x 2 to call back for a tele pre op appt.

## 2022-06-07 NOTE — Telephone Encounter (Signed)
   Pre-operative Risk Assessment    Patient Name: Kenneth [name]  DOB: 1935/12/01 MRN: [id number]  I HAVE S/W THE PT TODAY, SEE PREVIOUS PHONE NOTES. PT HAS 2 SEPARATE SURGERIES BEING DONE. I HAVE INPUT BOTH SURGERIES SEPARATELY TO NOT CAUSE ANY CONFUSION. PT HAS TELE PRE OP APPT 07/14/22 TO ADDRESS BOTH SURGERY CLEARANCES.    Request for Surgical Clearance    Procedure:   CYSTOSCOPY w/TURP  Date of Surgery:  Clearance 07/21/22                                 Surgeon:  DR. [name] Surgeon's Group or Practice Name:  [facility] Phone number:  [phone] Fax number:  [phone]   Type of Clearance Requested:   - Medical  - Pharmacy:  Hold Clopidogrel (Plavix)     Type of Anesthesia:  General    Additional requests/questions:    [name]   06/07/2022, 4:38 PM

## 2022-06-07 NOTE — Telephone Encounter (Signed)
I left a message for Dr. [name] office, that I was able to gather most of the information about procedure with Dr. [name] 07/21/22. I have the procedure: CYSTOSCOPYw/TURP, 07/21/22 with Dr. Alona Bene. I just need what type of anesthesia to be used and what fax # do we need to fax clearance to. Once I have complete information for Dr. [name] procedure I will make a separate pre op phone note as to not make clearance notes confusing on 2 separate procedure and 2 separate surgeon offices.

## 2022-06-07 NOTE — Telephone Encounter (Signed)
Pt called back and is agreeable to plan of care for tele pre op appt. Pt tells me that the spinal cord stimulator trail procedure is not to be until 08/2022. Pt states his wife has surgery as well as an EGD coming up. Pt states he also has a cystoscopy w/TURP coming up 07/21/22 with [name] office. I reviewed the chart and I can see in Epic the spinal cord trail procedure is set for 08/25/22.   I have scheduled a tele pre op appt 07/14/22 for 9 am. Med rec and consent are done. I assured the pt that I will update Dr. Kerby Nora office that we spoke today.    I will need to obtain the information from [name] office in regard to the procedure 07/21/22.   Pt thanked me for the help.     Patient Consent for Virtual Visit        Kenneth [name] has provided verbal consent on 06/07/2022 for a virtual visit (video or telephone).   CONSENT FOR VIRTUAL VISIT FOR:  Kenneth [name]  By participating in this virtual visit I agree to the following:  I hereby voluntarily request, consent and authorize [facility] and its employed or contracted physicians, physician assistants, nurse practitioners or other licensed health care professionals (the Practitioner), to provide me with telemedicine health care services (the "Services") as deemed necessary by the treating Practitioner. I acknowledge and consent to receive the Services by the Practitioner via telemedicine. I understand that the telemedicine visit will involve communicating with the Practitioner through live audiovisual communication technology and the disclosure of certain medical information by electronic transmission. I acknowledge that I have been given the opportunity to request an in-person assessment or other available alternative prior to the telemedicine visit and am voluntarily participating in the telemedicine visit.  I understand that I have the right to withhold or withdraw my consent to the use of telemedicine in the course of my care  at any time, without affecting my right to future care or treatment, and that the Practitioner or I may terminate the telemedicine visit at any time. I understand that I have the right to inspect all information obtained and/or recorded in the course of the telemedicine visit and may receive copies of available information for a reasonable fee.  I understand that some of the potential risks of receiving the Services via telemedicine include:  Delay or interruption in medical evaluation due to technological equipment failure or disruption; Information transmitted may not be sufficient (e.g. poor resolution of images) to allow for appropriate medical decision making by the Practitioner; and/or  In rare instances, security protocols could fail, causing a breach of personal health information.  Furthermore, I acknowledge that it is my responsibility to provide information about my medical history, conditions and care that is complete and accurate to the best of my ability. I acknowledge that Practitioner's advice, recommendations, and/or decision may be based on factors not within their control, such as incomplete or inaccurate data provided by me or distortions of diagnostic images or specimens that may result from electronic transmissions. I understand that the practice of medicine is not an exact science and that Practitioner makes no warranties or guarantees regarding treatment outcomes. I acknowledge that a copy of this consent can be made available to me via my patient portal [facility] MyChart), or I can request a printed copy by calling the office of [facility].    I understand that my insurance will be  billed for this visit.   I have read or had this consent read to me. I understand the contents of this consent, which adequately explains the benefits and risks of the Services being provided via telemedicine.  I have been provided ample opportunity to ask questions regarding this consent  and the Services and have had my questions answered to my satisfaction. I give my informed consent for the services to be provided through the use of telemedicine in my medical care

## 2022-06-07 NOTE — Telephone Encounter (Signed)
Pt called back and is agreeable to plan of care for tele pre op appt. Pt tells me that the spinal cord stimulator trail procedure is not to be until 08/2022. Pt states his wife has surgery as well as an EGD coming up. Pt states he also has a cystoscopy w/TURP coming up 07/21/22 with Dr. [name] office. I reviewed the chart and I can see in Epic the spinal cord trail procedure is set for 08/25/22.    I have scheduled a tele pre op appt 07/14/22 for 9 am. Med rec and consent are done. I assured the pt that I will update Dr. Leontine Locket office that we spoke today.      I will need to obtain the information from Dr. [name] office in regard to the procedure 07/21/22.    Pt thanked me for the help.

## 2022-06-07 NOTE — Telephone Encounter (Signed)
Dr. [name] office called back and has completed the info I needed for pre op for Dr. [name] procedure.  See separate phone for pre op clearance for Dr. [name].

## 2022-06-08 NOTE — Telephone Encounter (Signed)
Patient set up for phone visit 07/14/22 for clearance. Await input from Dr. Claiborne Billings regarding Plavix.   Loel Dubonnet, NP

## 2022-06-08 NOTE — Telephone Encounter (Signed)
   Name: Kenneth [name]  DOB: [birth year]  MRN: [id number]  Primary Cardiologist: Shelva Majestic, MD  Chart reviewed as part of pre-operative protocol coverage. Because of [name]'s past medical history and time since last visit, he will require a follow-up phone visit in order to better assess preoperative cardiovascular risk. This is scheduled for 07/14/22.  He is on Plavix post remote stroke. As this is refilled by Dr. Claiborne Billings, will route to him for input on holding prior to Cystoscopy with TURP.   Loel Dubonnet, NP  06/08/2022, 10:11 AM

## 2022-06-09 NOTE — Telephone Encounter (Signed)
Okay to hold Plavix for both procedures.  Patient was placed on Plavix by neurology following TIA 2015.

## 2022-06-09 NOTE — Telephone Encounter (Signed)
   Patient Name: Kenneth [name]  DOB: [birth year] MRN: [id number]  Primary Cardiologist: Shelva Majestic, MD  Per Dr. Claiborne Billings may hold PLavix 5 days prior to planned procedure. He was placed on this medication by neurology after TIA in 2015.   These instructions will be relayed and clearance will be discussed at preop phone visit 07/14/22.  Will route to requesting party so they are aware.   Loel Dubonnet, NP 06/09/2022, 4:03 PM

## 2022-06-09 NOTE — Telephone Encounter (Signed)
    Patient Name: Kenneth [name]  DOB: Oct 10, 1935 MRN: [id number]   Primary Cardiologist: Shelva Majestic, MD   Per Dr. Claiborne Billings may hold PLavix 7 days prior and 5 days post planned procedure. He was placed on this medication by neurology after TIA in 2015.    These instructions will be relayed and clearance will be discussed at preop phone visit 07/14/22.   Will route to requesting party so they are aware.    Loel Dubonnet, NP 06/09/2022, 4:03 PM

## 2022-07-14 ENCOUNTER — Ambulatory Visit: Payer: Medicare Other | Attending: Cardiovascular Disease | Admitting: General Practice

## 2022-07-14 DIAGNOSIS — Z01810 Encounter for preprocedural cardiovascular examination: Secondary | ICD-10-CM

## 2022-07-14 NOTE — Progress Notes (Signed)
Virtual Visit via Telephone Note   Because of Kenneth [name]'s co-morbid illnesses, he is at least at moderate risk for complications without adequate follow up.  This format is felt to be most appropriate for this patient at this time.  The patient did not have access to video technology/had technical difficulties with video requiring transitioning to audio format only (telephone).  All issues noted in this document were discussed and addressed.  No physical exam could be performed with this format.  Please refer to the patient's chart for his consent to telehealth for Kenneth [name].  Evaluation Performed:  Preoperative cardiovascular risk assessment _____________   Date:  07/14/2022   Patient ID:  Kenneth [name], DOB [birth year], MRN [id number] Patient Location:  Home Provider location:   Office  Primary Care Provider:  Alferd Apa, MD Primary Cardiologist:  Nicki Guadalajara, MD  Chief Complaint / Patient Profile   86 y.o. y/o male with a h/o diastolic dysfunction, dyslipidemia, hypertension, sleep apnea who is pending spinal cord stimulator trial and presents today for telephonic preoperative cardiovascular risk assessment.  History of Present Illness    Kenneth [name] is a 86 y.o. male who presents via audio/video conferencing for a telehealth visit today.  Pt was last seen in cardiology clinic on 04/07/2022 by Dr. Tresa Endo.  At that time Kenneth [name] was doing well .  The patient is now pending procedure as outlined above. Since his last visit, he remains stable from a cardiac standpoint  Today he denies chest pain, shortness of breath, lower extremity edema, fatigue, palpitations, melena, hematuria, hemoptysis, diaphoresis, weakness, presyncope, syncope, orthopnea, and PND.   Past Medical History    Past Medical History:  Diagnosis Date   Amaurosis fugax 09/22/2014   Diastolic dysfunction    grade 1 by echo 4/11   Dyslipidemia    HTN (hypertension)    Sleep apnea    on  BiPap   Past Surgical History:  Procedure Laterality Date   CATARACT EXTRACTION Bilateral    CHOLECYSTECTOMY     SEPTOPLASTY  2000   TONSILLECTOMY     TRANSURETHRAL RESECTION OF PROSTATE  1997    Allergies  No Known Allergies  Home Medications    Prior to Admission medications   Medication Sig Start Date End Date Taking? Authorizing Provider  allopurinol (ZYLOPRIM) 100 MG tablet Take 200 mg by mouth daily.     [provider]  bisoprolol (ZEBETA) 10 MG tablet TAKE 1 TABLET BY MOUTH  DAILY 10/18/21   Lennette Bihari, MD  Cholecalciferol 25 MCG (1000 UT) tablet Take 1,000 Units by mouth daily.    [provider]  clopidogrel (PLAVIX) 75 MG tablet TAKE 1 TABLET BY MOUTH  DAILY 10/28/21   Lennette Bihari, MD  diltiazem (CARDIZEM CD) 120 MG 24 hr capsule TAKE 1 CAPSULE BY MOUTH  DAILY 05/15/22   Lennette Bihari, MD  ferrous sulfate 325 (65 FE) MG EC tablet Take 325 mg by mouth daily.    [provider]  fish oil-omega-3 fatty acids 1000 MG capsule Take 1,200 mg by mouth daily.     [provider]  lisinopril (ZESTRIL) 40 MG tablet TAKE 1 TABLET BY MOUTH DAILY 04/28/22   Lennette Bihari, MD  loratadine (CLARITIN) 10 MG tablet Take 10 mg by mouth daily.    [provider]  Melatonin 3 MG TABS Take 1 tablet by mouth at bedtime as needed.    [provider]  metoprolol tartrate (LOPRESSOR)  25 MG tablet TAKE 1/2 TO 1 TABLET BY MOUTH AS NEEDED FOR PALPITATIONS 11/19/20   Lennette Bihari, MD  mirtazapine (REMERON) 7.5 MG tablet Take 7.5 mg by mouth at bedtime.    [provider]  rosuvastatin (CRESTOR) 20 MG tablet TAKE 1 TABLET BY MOUTH DAILY 12/15/21   Lennette Bihari, MD  spironolactone (ALDACTONE) 25 MG tablet TAKE 1 TABLET BY MOUTH  DAILY Patient taking differently: Take 12.5 mg by mouth daily. 01/30/22   Lennette Bihari, MD  tamsulosin (FLOMAX) 0.4 MG CAPS Take 0.4 mg by mouth daily after supper.    [provider]   testosterone cypionate (DEPOTESTOSTERONE CYPIONATE) 200 MG/ML injection Inject 400 mg into the muscle every 30 (thirty) days.    [provider]  Zinc 30 MG TABS Take by mouth.    [provider]    Physical Exam    Vital Signs:  Kenneth [name] does not have vital signs available for review today.  Given telephonic nature of communication, physical exam is limited. AAOx3. NAD. Normal affect.  Speech and respirations are unlabored.  Accessory Clinical Findings    None  Assessment & Plan    1.  Preoperative Cardiovascular Risk Assessment: Spinal cord stimulator trial, [name]      Primary Cardiologist: Nicki Guadalajara, MD  Chart reviewed as part of pre-operative protocol coverage. Given past medical history and time since last visit, based on ACC/AHA guidelines, Kenneth [name] would be at acceptable risk for the planned procedure without further cardiovascular testing.   Patient was advised that if he develops new symptoms prior to surgery to contact our office to arrange a follow-up appointment.  He verbalized understanding.  His RCRI is a class III risk, 6.6% risk of major cardiac event.  He is able to complete greater than 4 METS of physical activity.  His Plavix may be held for 7 days prior to his procedure and 5 days post procedure.  I will route this recommendation to the requesting party via Epic fax function and remove from pre-op pool.       Time:   Today, I have spent 6 minutes with the patient with telehealth technology discussing medical history, symptoms, and management plan.  Prior to his phone of patient I spent greater than 10 minutes reviewing his past medical history and cardiac medications   Ronney Asters, NP  07/14/2022, 8:07 AM

## 2022-07-28 ENCOUNTER — Other Ambulatory Visit: Payer: Self-pay

## 2022-07-28 ENCOUNTER — Telehealth: Payer: Medicare Other

## 2022-07-28 MED ORDER — CLOPIDOGREL BISULFATE 75 MG PO TABS: 75.0000 mg | ORAL_TABLET | Freq: Every day | ORAL | 0 refills | Status: DC

## 2022-07-28 NOTE — Addendum Note (Signed)
Addended by: Mariam Dollar on: 07/28/2022 09:31 AM   Modules accepted: Orders

## 2022-08-16 ENCOUNTER — Other Ambulatory Visit: Payer: Self-pay

## 2022-08-16 MED ORDER — CLOPIDOGREL BISULFATE 75 MG PO TABS: 75.0000 mg | ORAL_TABLET | Freq: Every day | ORAL | 1 refills | Status: DC

## 2022-08-28 ENCOUNTER — Encounter: Payer: Self-pay | Admitting: Cardiovascular Disease

## 2022-09-15 ENCOUNTER — Other Ambulatory Visit: Payer: Self-pay | Admitting: Cardiovascular Disease

## 2022-09-29 ENCOUNTER — Telehealth: Payer: Self-pay | Admitting: *Deleted

## 2022-09-29 NOTE — Telephone Encounter (Signed)
   Pre-operative Risk Assessment    Patient Name: Kenneth [name]  DOB: [birth year] MRN: [id number]      Request for Surgical Clearance    Procedure:   SPINAL CORD STIMULATOR PERM IMPLANT INSERTION  Date of Surgery:  Clearance 10/30/22                                 Surgeon:  DR. Lincoln Maxin Surgeon's Group or Practice Name:  [facility] Phone number:  [phone] Fax number:  [phone]   Type of Clearance Requested:   - Medical  - Pharmacy:  Hold Clopidogrel (Plavix) x 7 DAYS PRIOR TO PROCEDURE AND DAY PROCEDURE AND RESUME 24 HOURS POST PROCEDURE   Type of Anesthesia:  Not Indicated; LEFT MESSAGE TO CALL BACK WITH TYPE OF ANESTHESIA    Additional requests/questions:    [name]   09/29/2022, 12:03 PM

## 2022-10-02 ENCOUNTER — Telehealth: Payer: Self-pay | Admitting: *Deleted

## 2022-10-02 NOTE — Telephone Encounter (Signed)
  Patient Consent for Virtual Visit         Kenneth [name] has provided verbal consent on 10/02/2022 for a virtual visit (video or telephone).   CONSENT FOR VIRTUAL VISIT FOR:  Kenneth [name]  By participating in this virtual visit I agree to the following:  I hereby voluntarily request, consent and authorize [facility] and its employed or contracted physicians, physician assistants, nurse practitioners or other licensed health care professionals (the Practitioner), to provide me with telemedicine health care services (the "Services") as deemed necessary by the treating Practitioner. I acknowledge and consent to receive the Services by the Practitioner via telemedicine. I understand that the telemedicine visit will involve communicating with the Practitioner through live audiovisual communication technology and the disclosure of certain medical information by electronic transmission. I acknowledge that I have been given the opportunity to request an in-person assessment or other available alternative prior to the telemedicine visit and am voluntarily participating in the telemedicine visit.  I understand that I have the right to withhold or withdraw my consent to the use of telemedicine in the course of my care at any time, without affecting my right to future care or treatment, and that the Practitioner or I may terminate the telemedicine visit at any time. I understand that I have the right to inspect all information obtained and/or recorded in the course of the telemedicine visit and may receive copies of available information for a reasonable fee.  I understand that some of the potential risks of receiving the Services via telemedicine include:  Delay or interruption in medical evaluation due to technological equipment failure or disruption; Information transmitted may not be sufficient (e.g. poor resolution of images) to allow for appropriate medical decision making by the Practitioner;  and/or  In rare instances, security protocols could fail, causing a breach of personal health information.  Furthermore, I acknowledge that it is my responsibility to provide information about my medical history, conditions and care that is complete and accurate to the best of my ability. I acknowledge that Practitioner's advice, recommendations, and/or decision may be based on factors not within their control, such as incomplete or inaccurate data provided by me or distortions of diagnostic images or specimens that may result from electronic transmissions. I understand that the practice of medicine is not an exact science and that Practitioner makes no warranties or guarantees regarding treatment outcomes. I acknowledge that a copy of this consent can be made available to me via my patient portal ([facility]), or I can request a printed copy by calling the office of [facility].    I understand that my insurance will be billed for this visit.   I have read or had this consent read to me. I understand the contents of this consent, which adequately explains the benefits and risks of the Services being provided via telemedicine.  I have been provided ample opportunity to ask questions regarding this consent and the Services and have had my questions answered to my satisfaction. I give my informed consent for the services to be provided through the use of telemedicine in my medical care

## 2022-10-02 NOTE — Telephone Encounter (Signed)
Primary Cardiologist:Thomas Claiborne Billings, MD   Preoperative team, please contact this patient and set up a phone call appointment for further preoperative risk assessment. Please obtain consent and complete medication review. Thank you for your help.   Previously cleared to hold Plavix. For this procedure, request is to hold 7 day prior to procedure resume 24 hours postprocedure. Pending no s/s of ACS, it is reasonable to hold Plavix as requested.   Emmaline Life, NP-C  10/02/2022, 10:07 AM [address], [address] Office [phone] Fax [phone]

## 2022-10-06 ENCOUNTER — Ambulatory Visit: Payer: Medicare Other | Attending: Cardiovascular Disease | Admitting: Nurse Practitioner

## 2022-10-06 DIAGNOSIS — Z01810 Encounter for preprocedural cardiovascular examination: Secondary | ICD-10-CM

## 2022-10-06 NOTE — Progress Notes (Signed)
Virtual Visit via Telephone Note   Because of Kenneth [name]'s co-morbid illnesses, he is at least at moderate risk for complications without adequate follow up.  This format is felt to be most appropriate for this patient at this time.  The patient did not have access to video technology/had technical difficulties with video requiring transitioning to audio format only (telephone).  All issues noted in this document were discussed and addressed.  No physical exam could be performed with this format.  Please refer to the patient's chart for his consent to telehealth for [facility].  Evaluation Performed:  Preoperative cardiovascular risk assessment _____________   Date:  10/06/2022   Patient ID:  Kenneth [name], DOB 07-Mar-1936, MRN [id number] Patient Location:  Home Provider location:   Office  Primary Care Provider:  Azell Der, MD Primary Cardiologist:  Shelva Majestic, MD  Chief Complaint / Patient Profile   87 y.o. y/o male with a h/o diastolic dysfunction, dyslipidemia, hypertension, and OSA who is pending spinal cord stimulator perm implant insertion on 10/30/2022 with Dr. Lincoln [name] of [facility] and presents today for telephonic preoperative cardiovascular risk assessment.  History of Present Illness    Kenneth [name] is a 87 y.o. male who presents via audio/video conferencing for a telehealth visit today.  Pt was last seen in cardiology clinic on 04/07/2022 by Dr. Claiborne Billings.  He was seen virtually on 07/14/2022 by Kenneth Memos, NP.  At that time Kenneth [name] was doing well.  The patient is now pending procedure as outlined above. Since his last visit, he has done well from a cardiac standpoint.   He denies chest pain, palpitations, dyspnea, pnd, orthopnea, n, v, dizziness, syncope, edema, weight gain, or early satiety. All other systems reviewed and are otherwise negative except as noted above.   Past Medical History    Past Medical History:   Diagnosis Date   Amaurosis fugax [id number]   Diastolic dysfunction    grade 1 by echo 4/11   Dyslipidemia    HTN (hypertension)    Sleep apnea    on BiPap   Past Surgical History:  Procedure Laterality Date   CATARACT EXTRACTION Bilateral    CHOLECYSTECTOMY     SEPTOPLASTY  2000   TONSILLECTOMY     TRANSURETHRAL RESECTION OF PROSTATE  1997    Allergies  No Known Allergies  Home Medications    Prior to Admission medications   Medication Sig Start Date End Date Taking? Authorizing Provider  allopurinol (ZYLOPRIM) 100 MG tablet Take 200 mg by mouth daily.     [provider]  bisoprolol (ZEBETA) 10 MG tablet TAKE 1 TABLET BY MOUTH DAILY 09/19/22   Troy Sine, MD  Cholecalciferol 25 MCG (1000 UT) tablet Take 1,000 Units by mouth daily.    [provider]  clopidogrel (PLAVIX) 75 MG tablet Take 1 tablet (75 mg total) by mouth daily. 08/16/22   Troy Sine, MD  diltiazem (CARDIZEM CD) 120 MG 24 hr capsule TAKE 1 CAPSULE BY MOUTH  DAILY 05/15/22   Troy Sine, MD  ferrous sulfate 325 (65 FE) MG EC tablet Take 325 mg by mouth daily.    [provider]  fish oil-omega-3 fatty acids 1000 MG capsule Take 1,200 mg by mouth daily.     [provider]  furosemide (LASIX) 20 MG tablet Take 20 mg by mouth daily. 09/28/22 03/21/24  [provider]  lisinopril (ZESTRIL) 40 MG tablet TAKE 1 TABLET BY  MOUTH DAILY 04/28/22   Troy Sine, MD  loratadine (CLARITIN) 10 MG tablet Take 10 mg by mouth daily.    [provider]  metoprolol tartrate (LOPRESSOR) 25 MG tablet TAKE 1/2 TO 1 TABLET BY MOUTH AS NEEDED FOR PALPITATIONS 11/19/20   Troy Sine, MD  mirtazapine (REMERON) 7.5 MG tablet Take 7.5 mg by mouth at bedtime.    [provider]  rosuvastatin (CRESTOR) 20 MG tablet TAKE 1 TABLET BY MOUTH DAILY 12/15/21   Troy Sine, MD  tamsulosin (FLOMAX) 0.4 MG CAPS Take 0.4 mg by mouth daily after supper.    [provider]  testosterone cypionate (DEPOTESTOSTERONE CYPIONATE) 200 MG/ML injection Inject 400 mg into the muscle every 30 (thirty) days.    [provider]  Zinc 30 MG TABS Take by mouth.    [provider]    Physical Exam    Vital Signs:  Kenneth [name] does not have vital signs available for review today.  Given telephonic nature of communication, physical exam is limited. AAOx3. NAD. Normal affect.  Speech and respirations are unlabored.  Accessory Clinical Findings    None  Assessment & Plan    1.  Preoperative Cardiovascular Risk Assessment:  According to the Revised Cardiac Risk Index (RCRI), his Perioperative Risk of Major Cardiac Event is (%): 0.4. His Functional Capacity in METs is: 6.36 according to the Duke Activity Status Index (DASI). Therefore, based on ACC/AHA guidelines, patient would be at acceptable risk for the planned procedure without further cardiovascular testing.   The patient was advised that if he develops new symptoms prior to surgery to contact our office to arrange for a follow-up visit, and he verbalized understanding.  Per office protocol he may hold Plavix for 7 days prior to procedure. Please resume Plavix as soon as possible postprocedure, at the discretion of the surgeon.    A copy of this note will be routed to requesting surgeon.  Time:   Today, I have spent 5 minutes with the patient with telehealth technology discussing medical history, symptoms, and management plan.     Kenneth Sciara, NP  10/06/2022, 3:45 PM

## 2022-10-09 ENCOUNTER — Telehealth: Payer: Self-pay | Admitting: Cardiovascular Disease

## 2022-10-09 NOTE — Telephone Encounter (Signed)
Kenneth [name] with [facility] is following up requesting updates on clearance, specifically regarding holding Plavix. Kenneth [name] states a call may be returned to her direct cell at [phone].

## 2022-10-09 NOTE — Telephone Encounter (Signed)
I will re-fax clearance notes from Diona Browner, NP from 10/06/22. Pt has been cleared and the Plavix recommendations are noted in the ov note.   I did also s/w Claiborne Billings at surgeon office and informed that I will re-fax clearance, though notes were faxed on 10/06/22. I did confirm the fax # today as well. Claiborne Billings thanked me for the help.

## 2022-11-01 ENCOUNTER — Other Ambulatory Visit: Payer: Self-pay | Admitting: Cardiovascular Disease

## 2022-11-13 ENCOUNTER — Other Ambulatory Visit: Payer: Self-pay | Admitting: Cardiovascular Disease

## 2022-12-09 ENCOUNTER — Other Ambulatory Visit: Payer: Self-pay | Admitting: Cardiovascular Disease

## 2022-12-31 ENCOUNTER — Encounter: Payer: Self-pay | Admitting: Cardiovascular Disease

## 2023-01-01 ENCOUNTER — Telehealth: Payer: Self-pay | Admitting: *Deleted

## 2023-01-01 NOTE — Telephone Encounter (Signed)
I left a message for the patient to call our office to schedule a tele visit for pre-op 

## 2023-01-01 NOTE — Telephone Encounter (Signed)
   Pre-operative Risk Assessment    Patient Name: Kenneth [name]  DOB: 03-09-1936 MRN: [id number]      Request for Surgical Clearance    Procedure:   Colonoscopy  Date of Surgery:  Clearance 01/09/23                                 Surgeon:  Dr. Carla Drape Surgeon's Group or Practice Name:  [facility] Phone number:  [phone] Fax number:  [phone]   Type of Clearance Requested:   - Medical  - Pharmacy:  Hold Clopidogrel (Plavix) 5 days prior.   Type of Anesthesia:  Not Indicated   Additional requests/questions:    Signed, Emmit Pomfret   01/01/2023, 2:07 PM

## 2023-01-01 NOTE — Telephone Encounter (Signed)
   Name: Kenneth [name]  DOB: January 19, 1936  MRN: [id number]  Primary Cardiologist: Nicki Guadalajara, MD   Preoperative team, please contact this patient and set up a phone call appointment for further preoperative risk assessment. Please obtain consent and complete medication review. Thank you for your help.  I confirm that guidance regarding antiplatelet and oral anticoagulation therapy has been completed and, if necessary, noted below.  Per office protocol, if patient is without any new symptoms or concerns at the time of their virtual visit, he may hold Plavix for 5 days prior to procedure. Please resume Plavix as soon as possible postprocedure, at the discretion of the surgeon.     Joylene Grapes, NP 01/01/2023, 2:19 PM [facility]

## 2023-01-02 NOTE — Telephone Encounter (Signed)
2nd attempt: I left a message for the patient to call office to schedule a tele visit for pre-op.

## 2023-01-03 ENCOUNTER — Telehealth: Payer: Self-pay | Admitting: Cardiovascular Disease

## 2023-01-03 ENCOUNTER — Telehealth: Payer: Self-pay | Admitting: *Deleted

## 2023-01-03 NOTE — Telephone Encounter (Signed)
I tried making contact with pt. I lvmtrc and schedule a tele visit for his proep clearance.

## 2023-01-03 NOTE — Telephone Encounter (Signed)
It tried to reach pt again on his cell but goes directly to vm. I called his home # listed and was able to reach the pt. Pt did not realize his cell phone was going directly to vm. Med rec and consent are done. I did tell the pt that since I could not get him on until Friday 01/05/23 to begin holding Plavix as of tomorrow as the surgeon instructed him in order to achieve the hold time the doctor has requested. Pt is grateful for the help and the call.     Patient Consent for Virtual Visit        Kenneth [name] has provided verbal consent on 01/03/2023 for a virtual visit (video or telephone).   CONSENT FOR VIRTUAL VISIT FOR:  Kenneth [name]  By participating in this virtual visit I agree to the following:  I hereby voluntarily request, consent and authorize [facility] and its employed or contracted physicians, physician assistants, nurse practitioners or other licensed health care professionals (the Practitioner), to provide me with telemedicine health care services (the "Services") as deemed necessary by the treating Practitioner. I acknowledge and consent to receive the Services by the Practitioner via telemedicine. I understand that the telemedicine visit will involve communicating with the Practitioner through live audiovisual communication technology and the disclosure of certain medical information by electronic transmission. I acknowledge that I have been given the opportunity to request an in-person assessment or other available alternative prior to the telemedicine visit and am voluntarily participating in the telemedicine visit.  I understand that I have the right to withhold or withdraw my consent to the use of telemedicine in the course of my care at any time, without affecting my right to future care or treatment, and that the Practitioner or I may terminate the telemedicine visit at any time. I understand that I have the right to inspect all information obtained and/or recorded  in the course of the telemedicine visit and may receive copies of available information for a reasonable fee.  I understand that some of the potential risks of receiving the Services via telemedicine include:  Delay or interruption in medical evaluation due to technological equipment failure or disruption; Information transmitted may not be sufficient (e.g. poor resolution of images) to allow for appropriate medical decision making by the Practitioner; and/or  In rare instances, security protocols could fail, causing a breach of personal health information.  Furthermore, I acknowledge that it is my responsibility to provide information about my medical history, conditions and care that is complete and accurate to the best of my ability. I acknowledge that Practitioner's advice, recommendations, and/or decision may be based on factors not within their control, such as incomplete or inaccurate data provided by me or distortions of diagnostic images or specimens that may result from electronic transmissions. I understand that the practice of medicine is not an exact science and that Practitioner makes no warranties or guarantees regarding treatment outcomes. I acknowledge that a copy of this consent can be made available to me via my patient portal [facility] MyChart), or I can request a printed copy by calling the office of [facility].    I understand that my insurance will be billed for this visit.   I have read or had this consent read to me. I understand the contents of this consent, which adequately explains the benefits and risks of the Services being provided via telemedicine.  I have been provided ample opportunity to ask questions regarding  this consent and the Services and have had my questions answered to my satisfaction. I give my informed consent for the services to be provided through the use of telemedicine in my medical care

## 2023-01-03 NOTE — Telephone Encounter (Signed)
See clearance notes  

## 2023-01-03 NOTE — Telephone Encounter (Signed)
It tried to reach pt again on his cell but goes directly to vm. I called his home # listed and was able to reach the pt. Pt did not realize his cell phone was going directly to vm. Med rec and consent are done. I did tell the pt that since I could not get him on until Friday 01/05/23 to begin holding Plavix as of tomorrow as the surgeon instructed him in order to achieve the hold time the doctor has requested. Pt is grateful for the help and the call.

## 2023-01-03 NOTE — Telephone Encounter (Signed)
Pt was returning CMA call regarding scheduling tele visit for pre-op and would like a callback on his cell [phone]. Please advise.

## 2023-01-04 NOTE — Progress Notes (Signed)
Virtual Visit via Telephone Note   Because of Kenneth [name]'s co-morbid illnesses, he is at least at moderate risk for complications without adequate follow up.  This format is felt to be most appropriate for this patient at this time.  The patient did not have access to video technology/had technical difficulties with video requiring transitioning to audio format only (telephone).  All issues noted in this document were discussed and addressed.  No physical exam could be performed with this format.  Please refer to the patient's chart for his consent to telehealth for [facility].  Evaluation Performed:  Preoperative cardiovascular risk assessment _____________   Date:  01/04/2023   Patient ID:  Kenneth [name], DOB Jan 29, 1936, MRN [id number] Patient Location:  Home Provider location:   Office  Primary Care Provider:  Alferd Apa, MD Primary Cardiologist:  Nicki Guadalajara, MD  Chief Complaint / Patient Profile   87 y.o. y/o male with a h/o diastolic dysfunction, dyslipidemia, hypertension, sleep apnea  who is pending colonoscopy and presents today for telephonic preoperative cardiovascular risk assessment.  History of Present Illness    Kenneth [name] is a 87 y.o. male who presents via audio/video conferencing for a telehealth visit today.  Pt was last seen in cardiology clinic on 04/07/2022 by Dr. Tresa Endo.  He was seen most recently for telephone virtual visit on 10/06/2022 and was doing well from a cardiac perspective. The patient is now pending procedure as outlined above. Since his last visit, he reports doing well with no new cardiac complaints.  He is compliant with his medications and denies any adverse reactions.  He denies chest pain, shortness of breath, lower extremity edema, fatigue, palpitations, melena, hematuria, hemoptysis, diaphoresis, weakness, presyncope, syncope, orthopnea, and PND.    Past Medical History    Past Medical History:  Diagnosis Date   Amaurosis  fugax 09/22/2014   Diastolic dysfunction    grade 1 by echo 4/11   Dyslipidemia    HTN (hypertension)    Sleep apnea    on BiPap   Past Surgical History:  Procedure Laterality Date   CATARACT EXTRACTION Bilateral    CHOLECYSTECTOMY     SEPTOPLASTY  2000   TONSILLECTOMY     TRANSURETHRAL RESECTION OF PROSTATE  1997    Allergies  No Known Allergies  Home Medications    Prior to Admission medications   Medication Sig Start Date End Date Taking? Authorizing Provider  allopurinol (ZYLOPRIM) 100 MG tablet Take 200 mg by mouth daily.     [provider]  bisoprolol (ZEBETA) 10 MG tablet TAKE 1 TABLET BY MOUTH DAILY 09/19/22   Lennette Bihari, MD  Cholecalciferol 25 MCG (1000 UT) tablet Take 1,000 Units by mouth daily.    [provider]  clopidogrel (PLAVIX) 75 MG tablet TAKE 1 TABLET(75 MG) BY MOUTH DAILY 11/01/22   Lennette Bihari, MD  diltiazem (CARDIZEM CD) 120 MG 24 hr capsule TAKE 1 CAPSULE BY MOUTH  DAILY 05/15/22   Lennette Bihari, MD  ferrous sulfate 325 (65 FE) MG EC tablet Take 325 mg by mouth daily.    [provider]  fish oil-omega-3 fatty acids 1000 MG capsule Take 1,200 mg by mouth daily.     [provider]  furosemide (LASIX) 20 MG tablet Take 20 mg by mouth daily. 09/28/22 03/21/24  [provider]  lisinopril (ZESTRIL) 40 MG tablet TAKE 1 TABLET BY MOUTH DAILY 04/28/22   Lennette Bihari, MD  loratadine (CLARITIN) 10  MG tablet Take 10 mg by mouth daily.    [provider]  metoprolol tartrate (LOPRESSOR) 25 MG tablet TAKE 1/2 TO 1 TABLET BY MOUTH AS NEEDED FOR PALPITATIONS 11/19/20   Lennette Bihari, MD  mirtazapine (REMERON) 7.5 MG tablet Take 7.5 mg by mouth at bedtime.    [provider]  rosuvastatin (CRESTOR) 20 MG tablet TAKE 1 TABLET BY MOUTH DAILY 11/14/22   Lennette Bihari, MD  tamsulosin (FLOMAX) 0.4 MG CAPS Take 0.4 mg by mouth daily after supper.    [provider]  testosterone cypionate  (DEPOTESTOSTERONE CYPIONATE) 200 MG/ML injection Inject 400 mg into the muscle every 30 (thirty) days.    [provider]  Zinc 30 MG TABS Take by mouth.    [provider]    Physical Exam    Vital Signs:  Kenneth [name] does not have vital signs available for review today.  Given telephonic nature of communication, physical exam is limited. AAOx3. NAD. Normal affect.  Speech and respirations are unlabored.  Accessory Clinical Findings    None  Assessment & Plan    1.  Preoperative Cardiovascular Risk Assessment:  Patient's RCRI score is 0.9%  The patient affirms he has been doing well without any new cardiac symptoms. They are able to achieve 5 METS without cardiac limitations. Therefore, based on ACC/AHA guidelines, the patient would be at acceptable risk for the planned procedure without further cardiovascular testing. The patient was advised that if he develops new symptoms prior to surgery to contact our office to arrange for a follow-up visit, and he verbalized understanding.  The patient was advised that if he develops new symptoms prior to surgery to contact our office to arrange for a follow-up visit, and he verbalized understanding.  Patient can hold Plavix 5 days prior to procedure and should resume postprocedure when surgically safe at the discretion of performing provider.  A copy of this note will be routed to requesting surgeon.  Time:   Today, I have spent 8 minutes with the patient with telehealth technology discussing medical history, symptoms, and management plan.     Napoleon Form, Leodis Rains, NP  01/04/2023, 11:48 AM

## 2023-01-05 ENCOUNTER — Ambulatory Visit: Payer: Medicare Other | Attending: Cardiovascular Disease

## 2023-01-05 DIAGNOSIS — Z01810 Encounter for preprocedural cardiovascular examination: Secondary | ICD-10-CM

## 2023-02-16 ENCOUNTER — Other Ambulatory Visit: Payer: Self-pay | Admitting: Cardiovascular Disease

## 2023-03-28 ENCOUNTER — Other Ambulatory Visit: Payer: Self-pay | Admitting: Cardiovascular Disease

## 2023-04-30 ENCOUNTER — Encounter: Payer: Self-pay | Admitting: Cardiovascular Disease

## 2023-05-14 ENCOUNTER — Encounter: Payer: Self-pay | Admitting: Cardiovascular Disease

## 2023-05-14 ENCOUNTER — Ambulatory Visit: Payer: Medicare Other | Attending: Cardiovascular Disease | Admitting: Cardiovascular Disease

## 2023-05-14 VITALS — BP 90/64 | HR 64 | Ht 72.0 in | Wt 178.6 lb

## 2023-05-14 DIAGNOSIS — G4733 Obstructive sleep apnea (adult) (pediatric): Secondary | ICD-10-CM | POA: Diagnosis not present

## 2023-05-14 NOTE — Patient Instructions (Signed)
Medication Instructions:  STOP Amlodipine   MONITOR your blood pressure and heart rate  *If you need a refill on your cardiac medications before your next appointment, please call your pharmacy*   Lab Work: None If you have labs (blood work) drawn today and your tests are completely normal, you will receive your results only by: MyChart Message (if you have MyChart) OR A paper copy in the mail If you have any lab test that is abnormal or we need to change your treatment, we will call you to review the results.   Testing/Procedures: none  Follow-Up: At [facility], you and your health needs are our priority.  As part of our continuing mission to provide you with exceptional heart care, we have created designated Provider Care Teams.  These Care Teams include your primary Cardiologist (physician) and Advanced Practice Providers (APPs -  Physician Assistants and Nurse Practitioners) who all work together to provide you with the care you need, when you need it.  Your next appointment:   3-92month(s)  Provider:   Nicki Guadalajara, MD

## 2023-05-14 NOTE — Progress Notes (Signed)
Patient ID: Kenneth [name], male   DOB: 05/26/1936, 87 y.o.   MRN: [id number]        PCP: [name] at [facility] in [place]  HPI: Kenneth [name] is a 87 y.o. male who presents for a 14 month f/u sleep/cardiology evaluation.  [name] has a history of hypertension, hyperlipidemia, gout, and is status post TURP surgery by [name].  He has a residual underactive bladder and as result straight caths himself one time every day.  He states his post void residuals are in the 250-300 cc range.  He also has  bilateral renal cysts. He has a history of documented moderate obstructive sleep apnea with an AHI of 22.7 and an RDI of 33 and has been on BiPAP Auto therapy with an EPAP minimum of 10, IPAP maximum potential up to 25 with a delta of for which he started in July 2012. 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  Recently, he has had issues with very dry mouth.  He uses a nasal mask.  He has had intermittent use of a chin strap.    An echo Doppler study in 05/2013 revealed normal left ventricle wall thickness with normal systolic and diastolic function. He did have mild aortic regurgitation due to poor central leaflet coaptation. There was borderline prolapse of his anterior mitral valve leaflet with trivial mitral regurgitation. He had normal pulmonary pressures assessment artery systolic pressure 19 mm.  He has had issues in the past with peripheral edema, which had led to an increased dose of his torsemide last year.  He denies exertionally precipitated chest pain.  He did experience one episode of some chest fullness or night when he was sitting on his bed, but this ultimately resolved, and he has not had any further sensation He is unaware of palpitations.  He denies presyncope or syncope.  In December 2015, he complained of experiencing transient episodes of a field defect in the lateral left eye.  These were both self-limiting.  He saw an  ophthalmologist who recommended that he notify me concerning the symptoms  His history is notable in that he did sustain to prior head injuries many years ago.  Once when he was in high school and had a severe concussion.  Another time occurred when he fell out of a tree stand when he was hunting.  I subtotal referred him to [facility] neurology.  Due to my concern for possible amaurosis fugax.  He saw [name], who felt that the patient had experienced at least 3 episodes of amaurosis fugax in his left eye.  He underwent an MRI of the brain that showed mild small vessel ischemic changes in the MRA of the head and carotid Doppler studies which were unremarkable.  He was taken off aspirin and started on Plavix 75 mg.  He denies any recurrent symptomatology.  His primary physician is [name] in [place].   He has a history of hyperlipidemia and is tolerating Crestor 20 mg.  He has been on amlodipine 5 mg, Bystolic 20 mg, and lisinopril 40 mg for hypertension.  He was found to have increasing PSA levels with his levels rising from 1.5  to 7 and saw [name] for biopsy. He underwent cholecystectomy and at that time his ECG showed a PR interval at 212 ms, which was of concern by the anesthesiologist.  He tolerated surgery well without cardiovascular compromise.   In 2017 he had noticed a  change in development of shortness of breath with less activity.  He denied associated chest tightness and was experiencing shortness of breath with walking less than 100 yards.  This has been ongoing for several months.  He had noticed some trace swelling in his ankles.  A nuclear stress test which was done on 04/11/2016.  Ejection fraction was 59%.  There were no ECG changes.  There was normal perfusion.  An echo Doppler study on 04/12/2016 showed mild LVH with normal systolic function with very mild grade 1 diastolic dysfunction.  There was mild aortic insufficiency and trivial TR and PR.  Pulmonary pressures  were normal.  From a sleep perspective, he continues to use his BiPAP therapy.  He admits to his mouth getting dry during the day.  He has a nasal mask.  He may be having oral venting.  In the past.  He was given a chin strap but has not been consistently using this.  He has not had any recent download.  He believes his sleep initiation is good but he is having difficulty with sleep maintenance. He uses [employer] for his DME company.    He had experienced swelling in his lower extremity and a lower extremity Doppler study was negative for DVT in [place].  He was able to walk a mile without significant abnormalities, but when he has to carry his garbage can to the curb he notes shortness of breath.    Since his office visit in April 2018, he has continued to be without chest pain.  There is no change in his previous shortness of breath.  I had attempted to initiate spironolactone with his diastolic dysfunction but apparently during my absence heat, cold, the office and was concerned that he had not noticed any significant change.  He was advised to go back and take one half of his previous dose of torsemide and one half of spironolactone.  He has been doing this and actually he has felt somewhat better.  He continues to take lisinopril 40 mg, Bystolic 20 mg, and amlodipine for hypertension.  He denies significant swelling.  He is on Crestor for hyperlipidemia.   When I saw him in October 2018, he had received a new BiPAP machine, ResMed air curve 10 feet auto.  His set up date was 07/06/2017.  His minimum EPAP pressure is 9 with a maximum IPAP pressure of 25.  In the office today.  I obtained a download from 08/12/2017 through 09/10/2017.  He was 100% compliant with usage stays and usage greater than 4 hours was 97%.  He is averaging almost 6 hours and 30 minutes of sleep per night.  AHI is excellent at 1.2 and his 95th percentile pressure was 14.7/10.8 with a maximum average pressure of 15.8/11.8.  He  has noticed significant improvement with his new machine.  He is sleeping well.  When I saw him in January 2019 he was experiencing rare palpitations which he described as a chest flutter, which may last up to 30 seconds.  He has noticed this rarely every 2-3 months and typically there is an abrupt onset and abrupt cessation. Marland Kitchen  He denies presyncope or syncope.  He denies associated chest tightness.  At that office visit, I discontinued amlodipine and change this to Cardizem CD 120 mg at night and also gave a prescription from a told metoprolol tartrate to take 12.5 to 25 mg as needed.  I also discussed improved sleep duration at all possible.  At his evaluation  in June 2019 his major complaint is that of fatigue well as exertional dyspnea.  He denied chest pain, swelling or awareness of palpitations.  He continued to be on Bystolic 20 mg, diltiazem CD 120 daily, lisinopril 40 mg daily, in addition to low-dose spironolactone 12.5 mg and torsemide just 5 mg.  Continues to use BiPAP and a download from Dec 22, 2017 through January 20, 2018 shows 100% compliance.  However, sleep duration was only 5 hours and 45 minutes.  95th percentile pressure was 15/11 with a maximum pressure 16/12.  AHI is excellent at 1.0.    I evaluated him on December 11, 2018 in a telemedicine visit.  His blood pressure was controlled.  I reviewed a recent download which revealed 100% compliance with BiPAP usage however he was not using therapy for long enough duration and was only averaging 3 hours and 14 minutes.  His AHI was elevated at 20.3 on an EPAP minimum of 9 and IPAP maximum of 25.  I increase his EPAP minimum to 11 and further increased his pressure support to 5.  He was walking several days per week.  His echo study had previously demonstrated grade 1 diastolic dysfunction.  Coronary CT had noticed coronary calcification and for this reason I continued him on rosuvastatin at 0 mg attempt to induce LDL reduction to the 50s or  below.  He had undergone laboratory by his primary provider and LDL cholesterol was excellent at 37.  Primary provider question whether or not his dose of rosuvastatin should be decreased to 20 mg down to 10 mg..  He has been taking rosuvastatin 20 mg daily  When I saw him in October 2019 he continued to experience fatigability.  He was continued to have issues with diarrhea and some abdominal pain and has been seeing Dr. Opal Sidles at [place].  He was to undergo an abdominal vascular MRI.  He denied any chest tightness or pressure.  He was continuing to use BiPAP typically goes to bed between 10 and 1030 and wakes up around 6:30 AM.  A download in July 2019 showed 100% usage with an AHI of 4.5 he continues to experience some fatigue.  He has been evaluated by Dr. Opal Sidles at [facility] with pain and diarrhea.  He tells me he will be undergoing an abdominal vascular MRI.  He also is status post endoscopy.  He denies chest pain.  At times he notes shortness of breath particularly if he has to [place] b but a subsequent download in September 2019 showed increased AHI at 9.9 and was having some significant mask issues.  Presently, he has been on diltiazem 120 mg daily, lisinopril 40 mg, Bystolic 10 mg, in addition to Spironolactone 12.5 mg.  He also is on rosuvastatin 20 mg for hyperlipidemia.  His primary physician has been checking laboratory.  Remotely his LDL cholesterols have consistently been excellent and less than 60.   He was evaluated by Azalee Course, PAC in February 2020.  Of note, he had undergone an abdominal CT and this had demonstrated mild coronary artery calcification.  He was seen by his primary care provider [name] early February 2020 and his blood pressure was 106/56.  He was feeling well and was asymptomatic.   I evaluated him in a telemedicine visit on December 11, 2018.  A download was obtained  of his  BiPAP unit from March 30 through December 10, 2018.  Usage was 100%.  However, daily usage declined to  only 3 hours  and 14 minutes and only 10 out of 30 days with usage greater than 4 hours.  He apparently has a full facemask.  He never received the air fit F 30 little I that we had recommended but has a new full facemask covering his nose.  On his most recent download he did not have any significant leak.  He is set at an EPAP minimum pressure of 9 with an IPAP maximum pressure of 25 and a pressure support of 4.  AHI, however is elevated at 20.3 with an apnea index of 20.1.  95th percentile pressure was 22.1/18.1.  Typically he has been taking the mask off after he goes to the bathroom sometime during the night and typically has not put it on consistently.  He also has had some issues with dry mouth.    During that evaluation I adjusted his BiPAP therapy and increased EPAP minimum to 11 and increase pressure support to 5 such that his initial pressure will be 16/11 with potential titration up to 25/20.  Gust the importance of improved sleep duration and using CPAP for the preponderance of the night.  I saw him on August 07, 2019 and since his prior evaluation he had seen [name], his primary physician who I checked laboratory. Laboratory  showed an LDL cholesterol at 37 and his primary physician was wondering if his rosuvastatin dose should be reduced 20 down to 10 mg.  Prior CT did demonstrate coronary calcification as well as suggested a possible small pericardial effusion.  He has continued to use BiPAP and a new download from July 08, 2019 through August 06, 2019 shows 100% usage and usage greater than 4 hours at 73%.  Average usage on days used was 4 hours and 43 minutes.  He has issues with dry mouth.  AHI was improved from previously but still elevated at 10.3.  His 95th percentile pressure was 22.4/17.4 with a maximum average pressure 23.4/18.5.  He continues to see [name] for his urologic issues.  There is strong family history of prostate CA and the patient requires self cathing on a daily  basis.  He denies chest pain.  Is admit to a vague chest sensation if he bends over.  He denies any chest pain or shortness of breath with cutting his grass with a walking lawnmower.  He underwent a follow-up echo Doppler study on August 26, 2019 which showed an EF of 50 to 55% and grade 1 diastolic dysfunction.  There was mild to moderate aortic valve sclerosis without stenosis and mild aortic insufficiency.  There is mild dilation of his aortic root measuring 40 mm.    I saw him in July 2021 and over the prior 7 months he continued to do well. He  had issues with chronic pain and will need to undergo epidural injection scheduled tentatively for March 23, 2020.  He was to undergo repeat TURP surgery with [name] on August 16.  He has noticed some intermittent ankle swelling despite taking spironolactone 12.5 mg daily.  He has continued to use BiPAP therapy.  He does have some issues with sleep maintenance.  Download was obtained in the office today from June 20 through March 01, 2020 which shows 100% compliance.  However he usage duration is only 4 hours and 29 minutes.  AHI is elevated at 11.4 with an apnea index of 11.1 and central index of 4.4.  His 95th percentile pressure is 23.4/17.4 with a maximum average pressure 24/18 cm of  water.  I saw him on November 05, 2020 and since his prior evaluation he denied any episodes of chest pain or shortness of breath.  His blood pressure has been mildly elevated and he has been on lisinopril 40 mg daily, bisoprolol 10 mg, diltiazem 120 mg, and spironolactone 12.5 mg daily.  He has a prescription for metoprolol tartrate to take 12.5 or 25 mg as needed for palpitations.  He is scheduled to undergo a cystoscopy with [name] in 2 weeks.  He has been undergoing daily straight caths.  He has continued to use CPAP therapy and a download was obtained from February 23 through November 04, 2020.  He is compliant with usage days and average usage is 4 hours and 53 minutes.   Current settings include a minimum EPAP of 16 with maximum IPAP of 25 and pressure support of 6.  He continues to have complex events with an AHI of 14 and has both obstructive and central apnea.  He denies shortness of breath.  For that evaluation, I recommended slight increase in his pressure support up to 7 cm of water to be beneficial in improving his obstructive and central events.  At his evaluation with me on April 21, 2021 he continued to be stable from a cardiac standpoint and denied any chest pain or shortness of breath.  .  His blood pressure has been controlled on bisoprolol 10 mg, diltiazem 120 mg, lisinopril 40 mg and has been taking metoprolol tartrate on a as needed basis for palpitations.  Over the last 6 months he only took metoprolol on 2 occasions.  At times he admits to some edema left ankle greater than right.  He continues to be followed by [name].  Edema has improved with increase in spironolactone dose to 25 mg daily.  Gone recent laboratory and creatinine was minimally increased at 1.32 with GFR in the upper 50s consistent with stage IIIa CKD.  From a sleep perspective, he continued to use his BiPAP with excellent compliance.  I obtained a download from August 9 through April 20, 2021.  Currently he is set at a minimum EPAP of 16, maximum IPAP of 25, and pressure support of 7.  His 95th percentile maximum pressures are ranging around 24/17.  AHI continues to be increased at 14.3 with 6.9 central apneic events and 7.2 obstructive apneic events.  He has had issues with dry mouth.  With his normal LV function and continued complex sleep apnea with central events I felt he would benefit from ASV titration.  He underwent an ASV titration on May 11, 2021.  During that evaluation he again was noted to have severe obstructive sleep apnea with an AHI of 37.5 and RDI of 42.2 with oxygen desaturation to a nadir of 84%.  He had moderate snoring and O2 nadir 84%.  An initial trial of  SV advanced ASV therapy with an EPAP min of 7, maximum 14, pressure support min of 4 with maximum of 11 and maximum IPAP pressure of 25 with breath rate auto breaths per minute was recommended.  I evaluated him on September 23, 2021.  At that time he felt that he was sleeping significantly better with the ASV unit.  I obtained a download in the office today from November 15 through July 27, 2021 which still showed an increased AHI at 19.4.  He is meeting compliance standards but average usage continues to be suboptimal at only 5 hours per night.  Typically he goes to  bed between 10 and 11 PM and wakes up at 6 AM.  There is no significant mask leak.  His 95th percentile pressure is 22.6/13.6 with a maximum average pressure 24.2/13.9.  He believes he is sleeping better with ASV therapy.  An Epworth Sleepiness Scale score was recalculated in the office today and this endorsed at 6 arguing against residual daytime sleepiness.  During that evaluation, I change his ASV settings to EPAP minimum of 7 with maximum of 15, and change his pressure support to a range of 4 to 15 cm of water.  I last saw him on April 07, 2022. He continued to do well.  His blood pressure has been stable and at home has been ranging in the 120s over 60s.  He recently had an abdominal x-ray from his primary physician which noted heavy splenic artery calcification.  He continues to straight cath himself.   He has continued to use his ASV device.  A download was obtained from July 16 through March 27, 2022 which confirms 100% use.  He typically goes to bed between 10 and 11 PM and aims to wake up sometime around 7.  Average use was just under 6 hours at 5 hours and 53 minutes.  His 95th percentile pressure is 16.3/9.6 with maximum average pressure 19.7/10.5.  His pressure support range has mainly been around 9.  He believes he is sleeping well.  AHI is excellent at 1.0.  He had been started on Remeron by Dr. Sandria Manly at [employer] which had improved  his sleep.  He continues to see [name] at [facility] for his urologic care.  Since I last saw him, he has felt well.  He has noticed at times his blood pressure gets low.  He does admit to occasional lightheadedness when standing.  He has seen Dr. Haywood Pao for nephrology [facility].  He has continued to use his ASV device.  He has a ResMed air curve 10 ASV unit with set up date on June 30, 2021.  He is set at a minimum EPAP pressure of 7, minimum pressure support of 4 with maximal pressure support of 15 and maximal EPAP pressure of 15.  A download from February 13, 2023 to May 13, 2023 shows 100% use.  AHI is outstanding at 0.1.  His 95th percentile pressure is 15.8/9.2 with a maximum average pressure at 18.4/10.1.  He presents for evaluation.  Past Medical History:  Diagnosis Date   Amaurosis fugax 09/22/2014   Diastolic dysfunction    grade 1 by echo 4/11   Dyslipidemia    HTN (hypertension)    Sleep apnea    on BiPap    Past Surgical History:  Procedure Laterality Date   CATARACT EXTRACTION Bilateral    CHOLECYSTECTOMY     SEPTOPLASTY  2000   TONSILLECTOMY     TRANSURETHRAL RESECTION OF PROSTATE  1997    No Known Allergies  Current Outpatient Medications  Medication Sig Dispense Refill   allopurinol (ZYLOPRIM) 100 MG tablet Take 200 mg by mouth daily.      amLODipine (NORVASC) 10 MG tablet Take 10 mg by mouth daily.     bisoprolol (ZEBETA) 10 MG tablet TAKE 1 TABLET BY MOUTH DAILY 90 tablet 3   clopidogrel (PLAVIX) 75 MG tablet TAKE 1 TABLET BY MOUTH DAILY 90 tablet 3   diltiazem (CARDIZEM CD) 120 MG 24 hr capsule TAKE 1 CAPSULE BY MOUTH  DAILY 90 capsule 3   ferrous sulfate 325 (65 FE) MG  EC tablet Take 325 mg by mouth daily.     fish oil-omega-3 fatty acids 1000 MG capsule Take 1,200 mg by mouth daily.      lisinopril (ZESTRIL) 40 MG tablet TAKE 1 TABLET BY MOUTH DAILY 90 tablet 3   loratadine (CLARITIN) 10 MG tablet Take 10 mg by mouth daily.     metoprolol  tartrate (LOPRESSOR) 25 MG tablet TAKE 1/2 TO 1 TABLET BY MOUTH AS NEEDED FOR PALPITATIONS 30 tablet 9   mirtazapine (REMERON) 7.5 MG tablet Take 7.5 mg by mouth at bedtime.     rosuvastatin (CRESTOR) 20 MG tablet TAKE 1 TABLET BY MOUTH DAILY 90 tablet 3   tamsulosin (FLOMAX) 0.4 MG CAPS Take 0.4 mg by mouth daily after supper.     testosterone cypionate (DEPOTESTOSTERONE CYPIONATE) 200 MG/ML injection Inject 400 mg into the muscle every 30 (thirty) days.     Zinc 30 MG TABS Take by mouth.     Cholecalciferol 25 MCG (1000 UT) tablet Take 1,000 Units by mouth daily. (Patient not taking: Reported on 05/14/2023)     furosemide (LASIX) 20 MG tablet Take 20 mg by mouth daily. (Patient not taking: Reported on 05/14/2023)     No current facility-administered medications for this visit.    Social History   Socioeconomic History   Marital status: Married    Spouse name: Fannie Knee   Number of children: 3   Years of education: MD   Highest education level: Not on file  Occupational History   Occupation: Retired  Tobacco Use   Smoking status: Former   Smokeless tobacco: Never  Substance and Sexual Activity   Alcohol use: No   Drug use: No   Sexual activity: Not on file  Other Topics Concern   Not on file  Social History Narrative   Lives at home w/ his wife   Patient is right handed.   Patient drinks 2 cups caffeine daily.   Social Determinants of Health   Financial Resource Strain: Low Risk  (12/12/2022)   Received from [facility], [facility]   Overall Financial Resource Strain (CARDIA)    Difficulty of Paying Living Expenses: Not hard at all  Food Insecurity: No Food Insecurity (12/12/2022)   Received [facility], [facility]   Hunger Vital Sign    Worried About Running Out of Food in the Last Year: Never true    Ran Out of Food in the Last Year: Never true  Transportation Needs: No Transportation Needs (12/12/2022)   Received from [facility], [facility]   PRAPARE -  Transportation    Lack of Transportation (Medical): No    Lack of Transportation (Non-Medical): No  Physical Activity: Insufficiently Active (12/12/2022)   Received from [facility], [facility]   Exercise Vital Sign    Days of Exercise per Week: 3 days    Minutes of Exercise per Session: 40 min  Stress: No Stress Concern Present (12/12/2022)   Received from [facility], [facility] - Occupational Stress Questionnaire    Feeling of Stress : Only a little  Social Connections: Socially Integrated (12/12/2022)   Received from [facility], [facility]   Social Network    How would you rate your social network (family, work, friends)?: Good participation with social networks  Intimate Partner Violence: Not At Risk (12/12/2022)   Received from [facility], [facility]   HITS    Over the last 12 months how often did  your partner physically hurt you?: 1    Over the last 12 months how often did your partner insult you or talk down to you?: 1    Over the last 12 months how often did your partner threaten you with physical harm?: 1    Over the last 12 months how often did your partner scream or curse at you?: 1    Family History  Problem Relation Age of Onset   Alzheimer's disease Mother 28   Emphysema Father    Socially he is married and has 3 children, 14 grandchildren and 3 great-grandchildren. There is no tobacco or alcohol use.  ROS General: Negative; No fevers, chills, or night sweats;  HEENT: Positive for 2 transient episodes over the past year with left lateral eye field defect, sinus congestion, difficulty swallowing Pulmonary: Negative; No cough, wheezing, shortness of breath, hemoptysis Cardiovascular: See history of present illness GI: Negative; No nausea, vomiting, diarrhea, or abdominal pain GU: Negative; No dysuria, hematuria, or difficulty voiding Musculoskeletal: Positive for history of gout Hematologic/Oncology:  Negative; no easy bruising, bleeding Endocrine: Negative; no heat/cold intolerance; no diabetes Neuro: Positive for h/o amaurosis fugax; resolved since Plavix was instituted Skin: Negative; No rashes or skin lesions Psychiatric: Negative; No behavioral problems, depression Sleep: Positive for sleep apnea.  Now on ASV with improved sleep. Other comprehensive 14 point system review is negative.   PE BP 90/64   Pulse 64   Ht 6' (1.829 m)   Wt 178 lb 9.6 oz (81 kg)   SpO2 96%   BMI 24.22 kg/m    Repeat blood pressure by me was 120/70 supine and 100/66 standing.  Wt Readings from Last 3 Encounters:  05/14/23 178 lb 9.6 oz (81 kg)  04/07/22 185 lb (83.9 kg)  09/23/21 183 lb (83 kg)   General: Alert, oriented, no distress.  Skin: normal turgor, no rashes, warm and dry HEENT: Normocephalic, atraumatic. Pupils equal round and reactive to light; sclera anicteric; extraocular muscles intact;  Nose without nasal septal hypertrophy Mouth/Parynx benign; Mallinpatti scale 3 Neck: No JVD, no carotid bruits; normal carotid upstroke Lungs: clear to ausculatation and percussion; no wheezing or rales Chest wall: without tenderness to palpitation Heart: PMI not displaced, RRR, s1 s2 normal, 1/6 systolic murmur, no diastolic murmur, no rubs, gallops, thrills, or heaves Abdomen: soft, nontender; no hepatosplenomehaly, BS+; abdominal aorta nontender and not dilated by palpation. Back: no CVA tenderness Pulses 2+ Musculoskeletal: full range of motion, normal strength, no joint deformities Extremities: no clubbing cyanosis or edema, Homan's sign negative  Neurologic: grossly nonfocal; Cranial nerves grossly wnl Psychologic: Normal mood and affect   EKG Interpretation Date/Time:  Monday May 14 2023 09:43:04 EDT Ventricular Rate:  64 PR Interval:  230 QRS Duration:  80 QT Interval:  392 QTC Calculation: 404 R Axis:   3  Text Interpretation: Sinus rhythm with 1st degree A-V block with  occasional Premature ventricular complexes No previous ECGs available Confirmed by Nicki Guadalajara ([id number]) on 05/14/2023 10:33:43 AM     April 07, 2022 ECG (independently read by me):  Sinus bradycardia at 58; 1st degree AV block , PAC  September 23, 2021 ECG (independently read by me): Sinus bradycardia 55, first-degree AV block with PR interval 210 ms.   April 21, 2021 ECG (independently read by me): Sinus bradycardia 58 bpm with first-degree AV block..  PR 224 ms  November 05, 2020 ECG (independently read by me): Sinus bradycardia at 59, 1st degree block; PR 208 msec  July  2021 ECG (independently read by me): Sinus bradycardia 52 bpm.  No ectopy.  PR interval 206 ms  August 07, 2019 ECG (independently read by me): Sinus rhythm at 63 bpm with first-degree AV block with a PR 218 ms.  No ectopy.  October 2019 ECG (independently read by me): Sinus bradycardia with first-degree AV block.  PR interval 214 ms.  No ST segment changes.  No ectopy.  June 2019 ECG (independently read by me): Sinus bradycardia at 50 bpm.  First-degree AV block.  No significant ST segment changes.  January 2019 ECG (independently read by me): Sinus bradycardia 59 bpm.  Borderline first degree AV block with a PR interval 202 ms; QTC normal at 394 ms.  No ectopy.  April 2018 ECG (independently read by me): Normal sinus rhythm at 60, normal intervals.  No ST segment changes  October 2017 ECG (independently read by me): Sinus bradycardia at 57 bpm.  PR interval 200 ms.  QTc interval 395 ms.  July 2017 ECG (independently read by me): Normal sinus rhythm at 61 bpm.  Normal QTc with borderline first-degree AV block  November 2016 ECG (independently read by me): Normal sinus rhythm at 60 bpm.  PR interval 200 ms.  June 2016 ECG (independently read by me): Sinus rhythm at 63.  Intervals normal.  No ST segment changes  December 2015 ECG (independently read by me): Sinus bradycardia 59 bpm.  Mild first-degree AV block with  a PR interval 212 ms.  QTc interval normal.  No ST segment changes.  June 2015 ECG (independently read by me): sinus rhythm at 59 beats per minute.  Borderline first degree AV block with a PR interval of 204 ms.  ECG: Sinus rhythm at 61 beats per minute. Borderline first degree AV block with PR interval 200 ms. QT interval normal 388 ms.  LABS:    Latest Ref Rng & Units 09/26/2021    8:27 AM 04/16/2020    8:44 AM 12/18/2016    8:03 AM  BMP  Glucose 70 - 99 mg/dL 96  96  95   BUN 8 - 27 mg/dL 19  18  12    Creatinine 0.76 - 1.27 mg/dL 7.84  6.96  2.95   BUN/Creat Ratio 10 - 24 15  15     Sodium 134 - 144 mmol/L 141  133  142   Potassium 3.5 - 5.2 mmol/L 4.9  4.6  4.1   Chloride 96 - 106 mmol/L 106  100  110   CO2 20 - 29 mmol/L 23  23  25    Calcium 8.6 - 10.2 mg/dL 9.2  8.9  8.5       Latest Ref Rng & Units 09/26/2021    8:27 AM 04/16/2020    8:44 AM 12/18/2016    8:03 AM  Hepatic Function  Total Protein 6.0 - 8.5 g/dL 6.5  6.0  6.1   Albumin 3.6 - 4.6 g/dL 3.8  3.7  3.6   AST 0 - 40 IU/L 16  17  15    ALT 0 - 44 IU/L 15  16  14    Alk Phosphatase 44 - 121 IU/L 62  58  46   Total Bilirubin 0.0 - 1.2 mg/dL 0.6  0.8  0.8       Latest Ref Rng & Units 12/18/2016    8:03 AM 03/20/2016    8:18 AM 07/19/2015    8:04 AM  CBC  WBC 3.8 - 10.8 K/uL 6.2  6.3  6.0  Hemoglobin 13.2 - 17.1 g/dL 69.6  29.5  28.4   Hematocrit 38.5 - 50.0 % 39.5  44.6  42.3   Platelets 140 - 400 K/uL 149  155  150    Lab Results  Component Value Date   MCV 96.8 12/18/2016   MCV 95.1 03/20/2016   MCV 93.6 07/19/2015   Lab Results  Component Value Date   TSH 1.090 09/26/2021  No results found for: "HGBA1C"   Lipid Panel      Component Value Date/Time   CHOL 98 (L) 09/26/2021 0827   TRIG 87 09/26/2021 0827   HDL 36 (L) 09/26/2021 0827   CHOLHDL 2.7 09/26/2021 0827   CHOLHDL 3.0 12/18/2016 0803   VLDL 19 12/18/2016 0803   LDLCALC 45 09/26/2021 0827     RADIOLOGY: No results found.  IMPRESSION:  1.  OSA (obstructive sleep apnea)     ASSESSMENT AND PLAN: Mr. Shooter "Elijah Birk" Avellino is an 87 year-old gentleman who has a history of hypertension, hyperlipidemia, intermittent peripheral edema, severe complex obstructive sleep apnea, as well as remote history of gout. He has documented bilateral renal cysts and is status post TURP. He has an increased postvoid residual and straight caths himself one time every day.  He sees [name] at [facility] and had a repeat TURP surgery in March 29, 2020.  He continues to be on Plavix following his remote stroke.  Remotely he had experienced several episodes of amaurosis fugax in his left eye and an MRI of his brain showed mild small vessel ischemic changes and an MRA of his head and neck and carotid studies were unremarkable.  When I saw him in March 2022 his blood pressure was elevated, and with his previously documented grade 1 diastolic dysfunction I recommended slight additional titration of spironolactone to 25 mg which would be helpful both for blood pressure control and edema.  He has had issues with ankle edema left greater than right.  This improved with increase spironolactone dosing.  When I last saw him in August 2023 his blood pressure had remained stable with systolic blood pressure between 120 and 130 and diastolic blood pressures in the 60s.  He continues to be on bisoprolol 10 mg, diltiazem long-acting 120 mg, lisinopril 40 mg, and spironolactone 25 mg daily.  He takes metoprolol tartrate 12.5 to 25 mg as needed for palpitations.  He is on rosuvastatin 20 mg for hyperlipidemia.  LDL cholesterol on September 26, 2021 was excellent at 45 with total cholesterol 98 HDL 36 and triglycerides 87.  Presently, he has experienced some mild dizziness with standing.  His blood pressure is low today.  Repeat blood pressure by me did show mild orthostatic changes with blood pressure 120/70 supine dropping to 100/66 standing.  When I last saw him he was on long-acting  diltiazem which had been started in place of remote amlodipine.  Apparently, he has seen Dr.Waldenmichael and apparently was started on amlodipine at 10 mg and he is now taking 5 mg in addition to bisoprolol 10 mg, diltiazem 120 mg, furosemide 20 mg and metoprolol.  He is no longer taking lisinopril.  With his episodes of lightheadedness and mild orthostatic hypotension since he is already on a calcium channel blocker with diltiazem I have recommended he discontinue amlodipine.  If his blood pressure increases I would recommend slight titration of diltiazem to 180 mg.  He will follow his blood pressure.  He has had issues with renal insufficiency and his creatinine had increased  to 1.79 which has improved to 1.20 with discontinuance of lisinopril.  He continues to use ASV therapy with excellent benefit and his most recent download has shown an AHI of 0.1 with his 95th percentile pressure 15.8/9.2 with maximum average pressure 18.4/10.1.  He will monitor his blood pressure.  I will see him in 3 - 4 months for follow-up evaluation or sooner as needed.  Lennette Bihari, MD, [facility]  05/14/2023 10:33 AM

## 2023-05-20 ENCOUNTER — Encounter: Payer: Self-pay | Admitting: Cardiovascular Disease

## 2023-08-05 ENCOUNTER — Other Ambulatory Visit: Payer: Self-pay | Admitting: Cardiovascular Disease

## 2023-08-21 ENCOUNTER — Ambulatory Visit: Payer: Medicare Other | Attending: Cardiovascular Disease | Admitting: Cardiovascular Disease

## 2023-08-21 ENCOUNTER — Encounter: Payer: Self-pay | Admitting: Cardiovascular Disease

## 2023-08-21 DIAGNOSIS — R0602 Shortness of breath: Secondary | ICD-10-CM | POA: Insufficient documentation

## 2023-08-21 DIAGNOSIS — E7800 Pure hypercholesterolemia, unspecified: Secondary | ICD-10-CM | POA: Diagnosis not present

## 2023-08-21 DIAGNOSIS — M25471 Effusion, right ankle: Secondary | ICD-10-CM | POA: Diagnosis present

## 2023-08-21 DIAGNOSIS — I10 Essential (primary) hypertension: Secondary | ICD-10-CM | POA: Diagnosis not present

## 2023-08-21 DIAGNOSIS — G4739 Other sleep apnea: Secondary | ICD-10-CM | POA: Diagnosis present

## 2023-08-21 DIAGNOSIS — I358 Other nonrheumatic aortic valve disorders: Secondary | ICD-10-CM | POA: Diagnosis present

## 2023-08-21 MED ORDER — METOPROLOL SUCCINATE ER 25 MG PO TB24: ORAL_TABLET | ORAL | 1 refills | Status: DC

## 2023-08-21 NOTE — Patient Instructions (Addendum)
 Medication Instructions:  Start Metoprolol  Succinate 25mg . Take one tablet daily.   Monitor your heart rate. If your heart rate is below 50 bpm, TAKE ONE HALF OF THE METOPROLOL  SUCCCINATE TABLET ONLY 12.5MG    *If you need a refill on your cardiac medications before your next appointment, please call your pharmacy*   Lab Work: NO LABS If you have labs (blood work) drawn today and your tests are completely normal, you will receive your results only by: MyChart Message (if you have MyChart) OR A paper copy in the mail If you have any lab test that is abnormal or we need to change your treatment, we will call you to review the results.   Testing/Procedures: Your physician has requested that you have an echocardiogram IN ONE MONTH. Echocardiography is a painless test that uses sound waves to create images of your heart. It provides your doctor with information about the size and shape of your heart and how well your heart's chambers and valves are working. This procedure takes approximately one hour. There are no restrictions for this procedure. Please do NOT wear cologne, perfume, aftershave, or lotions (deodorant is allowed). Please arrive 15 minutes prior to your appointment time.  Please note: We ask at that you not bring children with you during ultrasound (echo/ vascular) testing. Due to room size and safety concerns, children are not allowed in the ultrasound rooms during exams. Our front office staff cannot provide observation of children in our lobby area while testing is being conducted. An adult accompanying a patient to their appointment will only be allowed in the ultrasound room at the discretion of the ultrasound technician under special circumstances. We apologize for any inconvenience.    Follow-Up: At [facility], you and your health needs are our priority.  As part of our continuing mission to provide you with exceptional heart care, we have created designated  Provider Care Teams.  These Care Teams include your primary Cardiologist (physician) and Advanced Practice Providers (APPs -  Physician Assistants and Nurse Practitioners) who all work together to provide you with the care you need, when you need it.  We recommend signing up for the patient portal called MyChart.  Sign up information is provided on this After Visit Summary.  MyChart is used to connect with patients for Virtual Visits (Telemedicine).  Patients are able to view lab/test results, encounter notes, upcoming appointments, etc.  Non-urgent messages can be sent to your provider as well.   To learn more about what you can do with MyChart, go to [email or web address].    Your next appointment:   6 month(s)  Provider:   JEREL BALDING     Other Instructions Thank you for choosing [facility]!      If you have any questions or concerns regarding your c-pap, bi-pap or sleep accessories, please contact Brandie Rorie at [phone].

## 2023-08-21 NOTE — Progress Notes (Signed)
 Patient ID: Kenneth [name], male   DOB: 07-27-1936, 88 y.o.   MRN: [id number]        PCP: Dr. Ubaldo Harness at [facility] in [place]  HPI: Kenneth [name] is a 88 y.o. male who presents for a 4 month f/u sleep/cardiology evaluation.  Kenneth [name] has a history of hypertension, hyperlipidemia, gout, and is status post TURP surgery by Dr. Janit.  He has a residual underactive bladder and as result straight caths himself one time every day.  He states his post void residuals are in the 250-300 cc range.  He also has  bilateral renal cysts. He has a history of documented moderate obstructive sleep apnea with an AHI of 22.7 and an RDI of 33 and has been on BiPAP Auto therapy with an EPAP minimum of 10, IPAP maximum potential up to 25 with a delta of for which he started in July 2012. 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  Recently, he has had issues with very dry mouth.  He uses a nasal mask.  He has had intermittent use of a chin strap.    An echo Doppler study in 05/2013 revealed normal left ventricle wall thickness with normal systolic and diastolic function. He did have mild aortic regurgitation due to poor central leaflet coaptation. There was borderline prolapse of his anterior mitral valve leaflet with trivial mitral regurgitation. He had normal pulmonary pressures assessment artery systolic pressure 19 mm.  He has had issues in the past with peripheral edema, which had led to an increased dose of his torsemide  last year.  He denies exertionally precipitated chest pain.  He did experience one episode of some chest fullness or night when he was sitting on his bed, but this ultimately resolved, and he has not had any further sensation He is unaware of palpitations.  He denies presyncope or syncope.  In December 2015, he complained of experiencing transient episodes of a field defect in the lateral left eye.  These were both self-limiting.  He saw an  ophthalmologist who recommended that he notify me concerning the symptoms  His history is notable in that he did sustain to prior head injuries many years ago.  Once when he was in high school and had a severe concussion.  Another time occurred when he fell out of a tree stand when he was hunting.  I subtotal referred him to [facility] neurology.  Due to my concern for possible amaurosis fugax.  He saw Dr. Francis Bull, who felt that the patient had experienced at least 3 episodes of amaurosis fugax in his left eye.  He underwent an MRI of the brain that showed mild small vessel ischemic changes in the MRA of the head and carotid Doppler studies which were unremarkable.  He was taken off aspirin and started on Plavix  75 mg.  He denies any recurrent symptomatology.  His primary physician is Dr. Harness in [place].   He has a history of hyperlipidemia and is tolerating Crestor  20 mg.  He has been on amlodipine  5 mg, Bystolic  20 mg, and lisinopril  40 mg for hypertension.  He was found to have increasing PSA levels with his levels rising from 1.5  to 7 and saw Dr. Janit for biopsy. He underwent cholecystectomy and at that time his ECG showed a PR interval at 212 ms, which was of concern by the anesthesiologist.  He tolerated surgery well without cardiovascular compromise.   In 2017 he had noticed a  change in development of shortness of breath with less activity.  He denied associated chest tightness and was experiencing shortness of breath with walking less than 100 yards.  This has been ongoing for several months.  He had noticed some trace swelling in his ankles.  A nuclear stress test which was done on 04/11/2016.  Ejection fraction was 59%.  There were no ECG changes.  There was normal perfusion.  An echo Doppler study on 04/12/2016 showed mild LVH with normal systolic function with very mild grade 1 diastolic dysfunction.  There was mild aortic insufficiency and trivial TR and PR.  Pulmonary pressures  were normal.  From a sleep perspective, he continues to use his BiPAP therapy.  He admits to his mouth getting dry during the day.  He has a nasal mask.  He may be having oral venting.  In the past.  He was given a chin strap but has not been consistently using this.  He has not had any recent download.  He believes his sleep initiation is good but he is having difficulty with sleep maintenance. He uses [employer] for his DME company.    He had experienced swelling in his lower extremity and a lower extremity Doppler study was negative for DVT in [place].  He was able to walk a mile without significant abnormalities, but when he has to carry his garbage can to the curb he notes shortness of breath.    Since his office visit in April 2018, he has continued to be without chest pain.  There is no change in his previous shortness of breath.  I had attempted to initiate spironolactone  with his diastolic dysfunction but apparently during my absence heat, cold, the office and was concerned that he had not noticed any significant change.  He was advised to go back and take one half of his previous dose of torsemide  and one half of spironolactone .  He has been doing this and actually he has felt somewhat better.  He continues to take lisinopril  40 mg, Bystolic  20 mg, and amlodipine  for hypertension.  He denies significant swelling.  He is on Crestor  for hyperlipidemia.   When I saw him in October 2018, he had received a new BiPAP machine, ResMed air curve 10 feet auto.  His set up date was 07/06/2017.  His minimum EPAP pressure is 9 with a maximum IPAP pressure of 25.  In the office today.  I obtained a download from 08/12/2017 through 09/10/2017.  He was 100% compliant with usage stays and usage greater than 4 hours was 97%.  He is averaging almost 6 hours and 30 minutes of sleep per night.  AHI is excellent at 1.2 and his 95th percentile pressure was 14.7/10.8 with a maximum average pressure of 15.8/11.8.  He  has noticed significant improvement with his new machine.  He is sleeping well.  When I saw him in January 2019 he was experiencing rare palpitations which he described as a chest flutter, which may last up to 30 seconds.  He has noticed this rarely every 2-3 months and typically there is an abrupt onset and abrupt cessation. SABRA  He denies presyncope or syncope.  He denies associated chest tightness.  At that office visit, I discontinued amlodipine  and change this to Cardizem  CD 120 mg at night and also gave a prescription from a told metoprolol  tartrate to take 12.5 to 25 mg as needed.  I also discussed improved sleep duration at all possible.  At his evaluation  in June 2019 his major complaint is that of fatigue well as exertional dyspnea.  He denied chest pain, swelling or awareness of palpitations.  He continued to be on Bystolic  20 mg, diltiazem  CD 120 daily, lisinopril  40 mg daily, in addition to low-dose spironolactone  12.5 mg and torsemide  just 5 mg.  Continues to use BiPAP and a download from Dec 22, 2017 through January 20, 2018 shows 100% compliance.  However, sleep duration was only 5 hours and 45 minutes.  95th percentile pressure was 15/11 with a maximum pressure 16/12.  AHI is excellent at 1.0.    I evaluated him on December 11, 2018 in a telemedicine visit.  His blood pressure was controlled.  I reviewed a recent download which revealed 100% compliance with BiPAP usage however he was not using therapy for long enough duration and was only averaging 3 hours and 14 minutes.  His AHI was elevated at 20.3 on an EPAP minimum of 9 and IPAP maximum of 25.  I increase his EPAP minimum to 11 and further increased his pressure support to 5.  He was walking several days per week.  His echo study had previously demonstrated grade 1 diastolic dysfunction.  Coronary CT had noticed coronary calcification and for this reason I continued him on rosuvastatin  at 0 mg attempt to induce LDL reduction to the 50s or  below.  He had undergone laboratory by his primary provider and LDL cholesterol was excellent at 37.  Primary provider question whether or not his dose of rosuvastatin  should be decreased to 20 mg down to 10 mg..  He has been taking rosuvastatin  20 mg daily  When I saw him in October 2019 he continued to experience fatigability.  He was continued to have issues with diarrhea and some abdominal pain and has been seeing Dr. Leverne at [place].  He was to undergo an abdominal vascular MRI.  He denied any chest tightness or pressure.  He was continuing to use BiPAP typically goes to bed between 10 and 1030 and wakes up around 6:30 AM.  A download in July 2019 showed 100% usage with an AHI of 4.5 he continues to experience some fatigue.  He has been evaluated by Dr. Leverne at [facility] with pain and diarrhea.  He tells me he will be undergoing an abdominal vascular MRI.  He also is status post endoscopy.  He denies chest pain.  At times he notes shortness of breath particularly if he has to [place] b but a subsequent download in September 2019 showed increased AHI at 9.9 and was having some significant mask issues.  Presently, he has been on diltiazem  120 mg daily, lisinopril  40 mg, Bystolic  10 mg, in addition to Spironolactone  12.5 mg.  He also is on rosuvastatin  20 mg for hyperlipidemia.  His primary physician has been checking laboratory.  Remotely his LDL cholesterols have consistently been excellent and less than 60.   He was evaluated by Scot Ford, PAC in February 2020.  Of note, he had undergone an abdominal CT and this had demonstrated mild coronary artery calcification.  He was seen by his primary care provider Dr. Dale early February 2020 and his blood pressure was 106/56.  He was feeling well and was asymptomatic.   I evaluated him in a telemedicine visit on December 11, 2018.  A download was obtained  of his  BiPAP unit from March 30 through December 10, 2018.  Usage was 100%.  However, daily usage declined to  only 3 hours  and 14 minutes and only 10 out of 30 days with usage greater than 4 hours.  He apparently has a full facemask.  He never received the air fit F 30 little I that we had recommended but has a new full facemask covering his nose.  On his most recent download he did not have any significant leak.  He is set at an EPAP minimum pressure of 9 with an IPAP maximum pressure of 25 and a pressure support of 4.  AHI, however is elevated at 20.3 with an apnea index of 20.1.  95th percentile pressure was 22.1/18.1.  Typically he has been taking the mask off after he goes to the bathroom sometime during the night and typically has not put it on consistently.  He also has had some issues with dry mouth.    During that evaluation I adjusted his BiPAP therapy and increased EPAP minimum to 11 and increase pressure support to 5 such that his initial pressure will be 16/11 with potential titration up to 25/20.  Gust the importance of improved sleep duration and using CPAP for the preponderance of the night.  I saw him on August 07, 2019 and since his prior evaluation he had seen Dr. Dale, his primary physician who I checked laboratory. Laboratory  showed an LDL cholesterol at 37 and his primary physician was wondering if his rosuvastatin  dose should be reduced 20 down to 10 mg.  Prior CT did demonstrate coronary calcification as well as suggested a possible small pericardial effusion.  He has continued to use BiPAP and a new download from July 08, 2019 through August 06, 2019 shows 100% usage and usage greater than 4 hours at 73%.  Average usage on days used was 4 hours and 43 minutes.  He has issues with dry mouth.  AHI was improved from previously but still elevated at 10.3.  His 95th percentile pressure was 22.4/17.4 with a maximum average pressure 23.4/18.5.  He continues to see Dr. Janit for his urologic issues.  There is strong family history of prostate CA and the patient requires self cathing on a daily  basis.  He denies chest pain.  Is admit to a vague chest sensation if he bends over.  He denies any chest pain or shortness of breath with cutting his grass with a walking lawnmower.  He underwent a follow-up echo Doppler study on August 26, 2019 which showed an EF of 50 to 55% and grade 1 diastolic dysfunction.  There was mild to moderate aortic valve sclerosis without stenosis and mild aortic insufficiency.  There is mild dilation of his aortic root measuring 40 mm.    I saw him in July 2021 and over the prior 7 months he continued to do well. He  had issues with chronic pain and will need to undergo epidural injection scheduled tentatively for March 23, 2020.  He was to undergo repeat TURP surgery with Dr. Janit on August 16.  He has noticed some intermittent ankle swelling despite taking spironolactone  12.5 mg daily.  He has continued to use BiPAP therapy.  He does have some issues with sleep maintenance.  Download was obtained in the office today from June 20 through March 01, 2020 which shows 100% compliance.  However he usage duration is only 4 hours and 29 minutes.  AHI is elevated at 11.4 with an apnea index of 11.1 and central index of 4.4.  His 95th percentile pressure is 23.4/17.4 with a maximum average pressure 24/18 cm of  water.  I saw him on November 05, 2020 and since his prior evaluation he denied any episodes of chest pain or shortness of breath.  His blood pressure has been mildly elevated and he has been on lisinopril  40 mg daily, bisoprolol  10 mg, diltiazem  120 mg, and spironolactone  12.5 mg daily.  He has a prescription for metoprolol  tartrate to take 12.5 or 25 mg as needed for palpitations.  He is scheduled to undergo a cystoscopy with Dr. Janit in 2 weeks.  He has been undergoing daily straight caths.  He has continued to use CPAP therapy and a download was obtained from February 23 through November 04, 2020.  He is compliant with usage days and average usage is 4 hours and 53 minutes.   Current settings include a minimum EPAP of 16 with maximum IPAP of 25 and pressure support of 6.  He continues to have complex events with an AHI of 14 and has both obstructive and central apnea.  He denies shortness of breath.  For that evaluation, I recommended slight increase in his pressure support up to 7 cm of water to be beneficial in improving his obstructive and central events.  At his evaluation with me on April 21, 2021 he continued to be stable from a cardiac standpoint and denied any chest pain or shortness of breath.  .  His blood pressure has been controlled on bisoprolol  10 mg, diltiazem  120 mg, lisinopril  40 mg and has been taking metoprolol  tartrate on a as needed basis for palpitations.  Over the last 6 months he only took metoprolol  on 2 occasions.  At times he admits to some edema left ankle greater than right.  He continues to be followed by Dr. Janit.  Edema has improved with increase in spironolactone  dose to 25 mg daily.  Gone recent laboratory and creatinine was minimally increased at 1.32 with GFR in the upper 50s consistent with stage IIIa CKD.  From a sleep perspective, he continued to use his BiPAP with excellent compliance.  I obtained a download from August 9 through April 20, 2021.  Currently he is set at a minimum EPAP of 16, maximum IPAP of 25, and pressure support of 7.  His 95th percentile maximum pressures are ranging around 24/17.  AHI continues to be increased at 14.3 with 6.9 central apneic events and 7.2 obstructive apneic events.  He has had issues with dry mouth.  With his normal LV function and continued complex sleep apnea with central events I felt he would benefit from ASV titration.  He underwent an ASV titration on May 11, 2021.  During that evaluation he again was noted to have severe obstructive sleep apnea with an AHI of 37.5 and RDI of 42.2 with oxygen desaturation to a nadir of 84%.  He had moderate snoring and O2 nadir 84%.  An initial trial of  SV advanced ASV therapy with an EPAP min of 7, maximum 14, pressure support min of 4 with maximum of 11 and maximum IPAP pressure of 25 with breath rate auto breaths per minute was recommended.  I evaluated him on September 23, 2021.  At that time he felt that he was sleeping significantly better with the ASV unit.  I obtained a download in the office today from November 15 through July 27, 2021 which still showed an increased AHI at 19.4.  He is meeting compliance standards but average usage continues to be suboptimal at only 5 hours per night.  Typically he goes to  bed between 10 and 11 PM and wakes up at 6 AM.  There is no significant mask leak.  His 95th percentile pressure is 22.6/13.6 with a maximum average pressure 24.2/13.9.  He believes he is sleeping better with ASV therapy.  An Epworth Sleepiness Scale score was recalculated in the office today and this endorsed at 6 arguing against residual daytime sleepiness.  During that evaluation, I change his ASV settings to EPAP minimum of 7 with maximum of 15, and change his pressure support to a range of 4 to 15 cm of water.  I saw him on April 07, 2022. He continued to do well.  His blood pressure has been stable and at home has been ranging in the 120s over 60s.  He recently had an abdominal x-ray from his primary physician which noted heavy splenic artery calcification.  He continues to straight cath himself.   He has continued to use his ASV device.  A download was obtained from July 16 through March 27, 2022 which confirms 100% use.  He typically goes to bed between 10 and 11 PM and aims to wake up sometime around 7.  Average use was just under 6 hours at 5 hours and 53 minutes.  His 95th percentile pressure is 16.3/9.6 with maximum average pressure 19.7/10.5.  His pressure support range has mainly been around 9.  He believes he is sleeping well.  AHI is excellent at 1.0.  He had been started on Remeron by Dr. Maurice at [place] which had improved his  sleep.  He continues to see Dr. Janit at [facility] for his urologic care.  I last saw him on May 14, 2023.  He was continuing to feel well.  At times he has noticed at times his blood pressure gets low.  He does admit to occasional lightheadedness when standing.  He has seen Dr. Pamela for nephrology [facility].  He was started on amlodipine  at 10 mg and was now taking 5 mg in addition to bisoprolol  10 mg, diltiazem  120 mg, furosemide 20 mg and metoprolol .  With his episode of lightheadedness and mild orthostatic hypotension since he already was on a calcium  channel blocker with diltiazem  I recommended he discontinue amlodipine .  He has continued to use his ASV device.  He has a ResMed air curve 10 ASV unit with set up date on June 30, 2021.  He is set at a minimum EPAP pressure of 7, minimum pressure support of 4 with maximal pressure support of 15 and maximal EPAP pressure of 15.  A download from February 13, 2023 to May 13, 2023 shows 100% use.  AHI is outstanding at 0.1.  His 95th percentile pressure is 15.8/9.2 with a maximum average pressure at 18.4/10.1.   Presently, he feels well.  He does experience some very mild shortness of breath when he climbs steps rapidly but not routinely.  At times he has noted some trace edema right lower extremity and may notice PVC.  He is not having any anginal symptoms.  He continues to use his ASV device.  We will need to have [employer] link his new machine to our office so we may obtain a download.  We tried obtaining a download today but the only 1 which was available was the one we had done in September which showed excellent AHI at 0.2 with his current settings.  Sleepiness scale score was calculated the office today and this endorsed at 4 argueing against excessive daytime sleepiness.  He presents  for evaluation.  Past Medical History:  Diagnosis Date   Amaurosis fugax 09/22/2014   Diastolic dysfunction    grade 1 by echo 4/11   Dyslipidemia     HTN (hypertension)    Sleep apnea    on BiPap    Past Surgical History:  Procedure Laterality Date   CATARACT EXTRACTION Bilateral    CHOLECYSTECTOMY     SEPTOPLASTY  2000   TONSILLECTOMY     TRANSURETHRAL RESECTION OF PROSTATE  1997    No Known Allergies  Current Outpatient Medications  Medication Sig Dispense Refill   allopurinol (ZYLOPRIM) 100 MG tablet Take 200 mg by mouth daily.      bisoprolol  (ZEBETA ) 10 MG tablet TAKE 1 TABLET BY MOUTH DAILY 90 tablet 3   clopidogrel  (PLAVIX ) 75 MG tablet TAKE 1 TABLET BY MOUTH DAILY 90 tablet 3   ferrous sulfate 325 (65 FE) MG EC tablet Take 325 mg by mouth daily.     fish oil-omega-3 fatty acids 1000 MG capsule Take 1,200 mg by mouth daily.      furosemide (LASIX) 20 MG tablet Take 20 mg by mouth daily.     loratadine (CLARITIN) 10 MG tablet Take 10 mg by mouth daily.     metoprolol  tartrate (LOPRESSOR ) 25 MG tablet Take 25 mg by mouth 2 (two) times daily. Take one tablet as needed for palpitations     mirtazapine (REMERON) 7.5 MG tablet Take 7.5 mg by mouth at bedtime.     rosuvastatin  (CRESTOR ) 20 MG tablet TAKE 1 TABLET BY MOUTH DAILY 90 tablet 3   tamsulosin (FLOMAX) 0.4 MG CAPS Take 0.4 mg by mouth daily after supper.     testosterone cypionate (DEPOTESTOSTERONE CYPIONATE) 200 MG/ML injection Inject 400 mg into the muscle every 30 (thirty) days.     UNABLE TO FIND Med Name: Magnesium OTC     Vitamin D, Cholecalciferol, 25 MCG (1000 UT) TABS Take by mouth.     Zinc 30 MG TABS Take by mouth.     diltiazem  (CARDIZEM  CD) 120 MG 24 hr capsule TAKE 1 CAPSULE BY MOUTH  DAILY 90 capsule 3   No current facility-administered medications for this visit.    Social History   Socioeconomic History   Marital status: Married    Spouse name: Beverley   Number of children: 3   Years of education: MD   Highest education level: Not on file  Occupational History   Occupation: Retired  Tobacco Use   Smoking status: Former   Smokeless tobacco:  Never  Substance and Sexual Activity   Alcohol use: No   Drug use: No   Sexual activity: Not on file  Other Topics Concern   Not on file  Social History Narrative   Lives at home w/ his wife   Patient is right handed.   Patient drinks 2 cups caffeine daily.   Social Drivers of Corporate Investment Banker Strain: Low Risk  (12/12/2022)   Received from [facility]   Overall Financial Resource Strain (CARDIA)    Difficulty of Paying Living Expenses: Not hard at all  Food Insecurity: Low Risk  (07/25/2023)   Received from [facility]   Hunger Vital Sign    Worried About Running Out of Food in the Last Year: Never true    Ran Out of Food in the Last Year: Never true  Transportation Needs: No Transportation Needs (07/25/2023)   Received from [facility]  In the past 12 months, has lack of reliable transportation kept you from medical appointments, meetings, work or from getting things needed for daily living? : No  Physical Activity: Insufficiently Active (12/12/2022)   Received from [facility], [facility]   Exercise Vital Sign    Days of Exercise per Week: 3 days    Minutes of Exercise per Session: 40 min  Stress: No Stress Concern Present (07/17/2023)   Received from [facility] - Occupational Stress Questionnaire    Feeling of Stress : Not at all  Social Connections: Socially Integrated (12/12/2022)   Received from [facility], [facility]   Social Network    How would you rate your social network (family, work, friends)?: Good participation with social networks  Intimate Partner Violence: Not At Risk (07/17/2023)   Received from [facility]   HITS    Over the last 12 months how often did your partner physically hurt you?: Never    Over the last 12 months how often did your partner insult you or talk down to you?: Never    Over the last 12 months how often did your partner threaten  you with physical harm?: Never    Over the last 12 months how often did your partner scream or curse at you?: Never    Family History  Problem Relation Age of Onset   Alzheimer's disease Mother 58   Emphysema Father    Socially he is married and has 3 children, 14 grandchildren and 3 great-grandchildren. There is no tobacco or alcohol use.  ROS General: Negative; No fevers, chills, or night sweats;  HEENT: Positive for 2 transient episodes over the past year with left lateral eye field defect, sinus congestion, difficulty swallowing Pulmonary: Negative; No cough, wheezing, shortness of breath, hemoptysis Cardiovascular: See history of present illness GI: Negative; No nausea, vomiting, diarrhea, or abdominal pain GU: Negative; No dysuria, hematuria, or difficulty voiding Musculoskeletal: Positive for history of gout Hematologic/Oncology: Negative; no easy bruising, bleeding Endocrine: Negative; no heat/cold intolerance; no diabetes Neuro: Positive for h/o amaurosis fugax; resolved since Plavix  was instituted Skin: Negative; No rashes or skin lesions Psychiatric: Negative; No behavioral problems, depression Sleep: Positive for sleep apnea.  Now on ASV with improved sleep. Other comprehensive 14 point system review is negative.   PE BP 128/84   Pulse 64   Ht 6' (1.829 m)   Wt 185 lb 6.4 oz (84.1 kg)   SpO2 97%   BMI 25.14 kg/m    Repeat blood pressure by me was 138/84  Wt Readings from Last 3 Encounters:  08/21/23 185 lb 6.4 oz (84.1 kg)  05/14/23 178 lb 9.6 oz (81 kg)  04/07/22 185 lb (83.9 kg)   General: Alert, oriented, no distress.  Skin: normal turgor, no rashes, warm and dry HEENT: Normocephalic, atraumatic. Pupils equal round and reactive to light; sclera anicteric; extraocular muscles intact; Nose without nasal septal hypertrophy Mouth/Parynx benign; Mallinpatti scale 3 Neck: No JVD, no carotid bruits; normal carotid upstroke Lungs: clear to ausculatation and  percussion; no wheezing or rales Chest wall: without tenderness to palpitation Heart: PMI not displaced, RRR, s1 s2 normal, 1/6 systolic murmur, no diastolic murmur, no rubs, gallops, thrills, or heaves Abdomen: soft, nontender; no hepatosplenomehaly, BS+; abdominal aorta nontender and not dilated by palpation. Back: no CVA tenderness Pulses 2+ Musculoskeletal: full range of motion, normal strength, no joint deformities Extremities: no clubbing cyanosis or edema, Homan's sign negative  Neurologic:  grossly nonfocal; Cranial nerves grossly wnl Psychologic: Normal mood and affect    EKG Interpretation Date/Time:  Tuesday August 21 2023 10:23:58 EST Ventricular Rate:  64 PR Interval:  192 QRS Duration:  80 QT Interval:  402 QTC Calculation: 414 R Axis:   -3  Text Interpretation: Sinus rhythm with occasional Premature ventricular complexes When compared with ECG of 14-May-2023 09:43, PR interval has decreased Confirmed by Burnard Ned ([id number]) on 08/21/2023 10:50:34 AM    April 07, 2022 ECG (independently read by me):  Sinus bradycardia at 58; 1st degree AV block , PAC  September 23, 2021 ECG (independently read by me): Sinus bradycardia 55, first-degree AV block with PR interval 210 ms.   April 21, 2021 ECG (independently read by me): Sinus bradycardia 58 bpm with first-degree AV block..  PR 224 ms  November 05, 2020 ECG (independently read by me): Sinus bradycardia at 59, 1st degree block; PR 208 msec  July 2021 ECG (independently read by me): Sinus bradycardia 52 bpm.  No ectopy.  PR interval 206 ms  August 07, 2019 ECG (independently read by me): Sinus rhythm at 63 bpm with first-degree AV block with a PR 218 ms.  No ectopy.  October 2019 ECG (independently read by me): Sinus bradycardia with first-degree AV block.  PR interval 214 ms.  No ST segment changes.  No ectopy.  June 2019 ECG (independently read by me): Sinus bradycardia at 50 bpm.  First-degree AV block.  No  significant ST segment changes.  January 2019 ECG (independently read by me): Sinus bradycardia 59 bpm.  Borderline first degree AV block with a PR interval 202 ms; QTC normal at 394 ms.  No ectopy.  April 2018 ECG (independently read by me): Normal sinus rhythm at 60, normal intervals.  No ST segment changes  October 2017 ECG (independently read by me): Sinus bradycardia at 57 bpm.  PR interval 200 ms.  QTc interval 395 ms.  July 2017 ECG (independently read by me): Normal sinus rhythm at 61 bpm.  Normal QTc with borderline first-degree AV block  November 2016 ECG (independently read by me): Normal sinus rhythm at 60 bpm.  PR interval 200 ms.  June 2016 ECG (independently read by me): Sinus rhythm at 63.  Intervals normal.  No ST segment changes  December 2015 ECG (independently read by me): Sinus bradycardia 59 bpm.  Mild first-degree AV block with a PR interval 212 ms.  QTc interval normal.  No ST segment changes.  June 2015 ECG (independently read by me): sinus rhythm at 59 beats per minute.  Borderline first degree AV block with a PR interval of 204 ms.  ECG: Sinus rhythm at 61 beats per minute. Borderline first degree AV block with PR interval 200 ms. QT interval normal 388 ms.  LABS:    Latest Ref Rng & Units 09/26/2021    8:27 AM 04/16/2020    8:44 AM 12/18/2016    8:03 AM  BMP  Glucose 70 - 99 mg/dL 96  96  95   BUN 8 - 27 mg/dL 19  18  12    Creatinine 0.76 - 1.27 mg/dL 8.70  8.75  8.94   BUN/Creat Ratio 10 - 24 15  15     Sodium 134 - 144 mmol/L 141  133  142   Potassium 3.5 - 5.2 mmol/L 4.9  4.6  4.1   Chloride 96 - 106 mmol/L 106  100  110   CO2 20 - 29 mmol/L 23  23  25   Calcium  8.6 - 10.2 mg/dL 9.2  8.9  8.5       Latest Ref Rng & Units 09/26/2021    8:27 AM 04/16/2020    8:44 AM 12/18/2016    8:03 AM  Hepatic Function  Total Protein 6.0 - 8.5 g/dL 6.5  6.0  6.1   Albumin 3.6 - 4.6 g/dL 3.8  3.7  3.6   AST 0 - 40 IU/L 16  17  15    ALT 0 - 44 IU/L 15  16  14    Alk  Phosphatase 44 - 121 IU/L 62  58  46   Total Bilirubin 0.0 - 1.2 mg/dL 0.6  0.8  0.8       Latest Ref Rng & Units 12/18/2016    8:03 AM 03/20/2016    8:18 AM 07/19/2015    8:04 AM  CBC  WBC 3.8 - 10.8 K/uL 6.2  6.3  6.0   Hemoglobin 13.2 - 17.1 g/dL 86.9  85.0  85.3   Hematocrit 38.5 - 50.0 % 39.5  44.6  42.3   Platelets 140 - 400 K/uL 149  155  150    Lab Results  Component Value Date   MCV 96.8 12/18/2016   MCV 95.1 03/20/2016   MCV 93.6 07/19/2015   Lab Results  Component Value Date   TSH 1.090 09/26/2021  No results found for: HGBA1C   Lipid Panel      Component Value Date/Time   CHOL 98 (L) 09/26/2021 0827   TRIG 87 09/26/2021 0827   HDL 36 (L) 09/26/2021 0827   CHOLHDL 2.7 09/26/2021 0827   CHOLHDL 3.0 12/18/2016 0803   VLDL 19 12/18/2016 0803   LDLCALC 45 09/26/2021 0827     RADIOLOGY: No results found.  IMPRESSION:  1. Primary hypertension   2. Pure hypercholesterolemia   3. SOB (shortness of breath)   4. Complex sleep apnea syndrome on ASV   5. Aortic valve sclerosis   6. 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 He has documented bilateral renal cysts and is status post TURP. He has an increased postvoid residual and straight caths himself one time every day.  He sees Dr. Janit at [facility] and had a repeat TURP surgery in March 29, 2020.  He continues to be on Plavix  following his remote stroke.  Remotely he had experienced several episodes of amaurosis fugax in his left eye and an MRI of his brain showed mild small vessel ischemic changes and an MRA of his head and neck and carotid studies were unremarkable.  When I saw him in March 2022 his blood pressure was elevated, and with his previously documented  grade 1 diastolic dysfunction I recommended slight additional titration of spironolactone  to 25 mg which would be helpful both for blood pressure control and edema.  He has had issues with ankle edema left greater than right.  This improved with increase spironolactone  dosing.  When I saw him in August 2023 his blood pressure had remained stable with systolic blood pressure between 120 and 130 and diastolic blood pressures in the 60s.  He continued to be on bisoprolol  10 mg, diltiazem  long-acting 120 mg, lisinopril  40 mg, and spironolactone  25 mg daily.  He takes metoprolol  tartrate 12.5  to 25 mg as needed for palpitations.  He is on rosuvastatin  20 mg for hyperlipidemia.  LDL cholesterol on September 26, 2021 was excellent at 45 with total cholesterol 98 HDL 36 and triglycerides 87.  When seen in September 2024, he experienced mild dizziness with standing and blood pressure was low.  Apparently, the patient had been started on amlodipine  by another physician and he already was on diltiazem .  I recommended he discontinue amlodipine  and slightly titrated diltiazem  to 180 mg.  Presently, blood pressure remains fairly stable.  He denies any chest pain.  He does experience some mild shortness of breath when climbing up steps fast but not routinely.  He does have trace edema in the right lower extremity.  I have recommended he undergo a follow-up echo Doppler study for reassessment of systolic and diastolic function in addition to valvular architecture.  He continues to be on bisoprolol  10 mg daily, diltiazem  which may only be 120 mg in addition to as needed metoprolol  tartrate.  He has a prescription for furosemide 20 mg which she takes daily.  He continues to use his air curve 10 ASV device for his complex sleep apnea.  His last download from September showed 100% usage with AHI 0.2 with his settings at a minimum EPAP of 7, maximum EPAP 15, minimum pressure support of 4 to a maximal pressure support of 15.  95th  percentile pressure was 15.9/9.1 with maximum average pressure 18.0/9.9.  [employer] will need to link his new device to our office so that we be able to ascertain ongoing data.  He tells me he will be having follow-up urologic evaluation with Dr. Janit in February at [facility].  Discussed with him today my plans for retirement in June 2025.  Following his echo Doppler study, I will transition him to the care of Dr. Francyne to be seen in 6 months or sooner as needed.  Debby RONAL Sor, MD, [facility], ABSM Diplomate, American Board of Sleep Medicine  08/23/2023 3:53 PM

## 2023-08-22 ENCOUNTER — Encounter: Payer: Self-pay | Admitting: Cardiovascular Disease

## 2023-08-23 ENCOUNTER — Other Ambulatory Visit: Payer: Self-pay

## 2023-08-23 MED ORDER — DILTIAZEM HCL ER COATED BEADS 120 MG PO CP24: ORAL_CAPSULE | ORAL | 3 refills | Status: DC

## 2023-08-23 NOTE — Telephone Encounter (Signed)
 Rx sent to requested Pharmacy.

## 2023-09-03 ENCOUNTER — Encounter: Payer: Self-pay | Admitting: Cardiovascular Disease

## 2023-09-03 MED ORDER — CLOPIDOGREL BISULFATE 75 MG PO TABS: 75.0000 mg | ORAL_TABLET | Freq: Every day | ORAL | 3 refills | Status: AC

## 2023-09-17 ENCOUNTER — Telehealth: Payer: Self-pay | Admitting: Cardiovascular Disease

## 2023-09-17 ENCOUNTER — Other Ambulatory Visit: Payer: Self-pay

## 2023-09-17 MED ORDER — BISOPROLOL FUMARATE 10 MG PO TABS: 10.0000 mg | ORAL_TABLET | Freq: Every day | ORAL | 3 refills | Status: DC

## 2023-09-17 NOTE — Telephone Encounter (Signed)
*  STAT* If patient is at the pharmacy, call can be transferred to refill team.   1. Which medications need to be refilled? (please list name of each medication and dose if known) bisoprolol (ZEBETA) 10 MG tablet   2. Which pharmacy/location (including street and city if local pharmacy) is medication to be sent to? [facility] - [place], [place] - [address]   3. Do they need a 30 day or 90 day supply? 90

## 2023-09-20 ENCOUNTER — Ambulatory Visit (HOSPITAL_COMMUNITY)
Admission: RE | Admit: 2023-09-20 | Discharge: 2023-09-20 | Disposition: A | Discharge status: Home / Self Care | Payer: Medicare Other | Source: Ambulatory Visit | Attending: Cardiovascular Disease | Admitting: Cardiovascular Disease

## 2023-09-20 DIAGNOSIS — R0602 Shortness of breath: Secondary | ICD-10-CM | POA: Diagnosis present

## 2023-09-20 LAB — ECHOCARDIOGRAM COMPLETE
AR max vel: 2.1 cm2
AV Area VTI: 2.14 cm2
AV Area mean vel: 2.14 cm2
AV Mean grad: 4 mm[Hg]
AV Peak grad: 7.5 mm[Hg]
Ao pk vel: 1.37 m/s
Area-P 1/2: 2.74 cm2
MV M vel: 0.7 m/s
MV Peak grad: 2 mm[Hg]
MV VTI: 1.12 cm2
P 1/2 time: 510 ms
S' Lateral: 3.28 cm

## 2023-09-25 ENCOUNTER — Encounter: Payer: Self-pay | Admitting: Cardiovascular Disease

## 2023-09-27 NOTE — Telephone Encounter (Signed)
Spoke to Dr. Tresa Endo about resulting pt echo. Says he will get it done today.

## 2023-10-10 ENCOUNTER — Encounter: Payer: Self-pay | Admitting: Cardiovascular Disease

## 2023-11-07 ENCOUNTER — Encounter: Payer: Self-pay | Admitting: Cardiovascular Disease

## 2023-11-15 ENCOUNTER — Encounter: Payer: Self-pay | Admitting: Cardiovascular Disease

## 2023-11-15 MED ORDER — BISOPROLOL FUMARATE 10 MG PO TABS: 10.0000 mg | ORAL_TABLET | Freq: Every day | ORAL | 3 refills | Status: AC

## 2024-01-25 ENCOUNTER — Encounter: Payer: Self-pay | Admitting: Cardiovascular Disease

## 2024-08-11 ENCOUNTER — Other Ambulatory Visit: Payer: Self-pay

## 2024-08-12 MED ORDER — DILTIAZEM HCL ER COATED BEADS 120 MG PO CP24: ORAL_CAPSULE | ORAL | 0 refills | Status: AC
